# Patient Record
Sex: Female | Born: 1935 | Race: White | Hispanic: No | Smoking: Current some day smoker
Health system: Southern US, Community
[De-identification: ages and names within clinical notes are randomized; demographics above are authoritative.]

## PROBLEM LIST (undated history)

## (undated) DIAGNOSIS — Z9221 Personal history of antineoplastic chemotherapy: Secondary | ICD-10-CM

## (undated) DIAGNOSIS — E079 Disorder of thyroid, unspecified: Secondary | ICD-10-CM

## (undated) DIAGNOSIS — I10 Essential (primary) hypertension: Secondary | ICD-10-CM

## (undated) DIAGNOSIS — C801 Malignant (primary) neoplasm, unspecified: Secondary | ICD-10-CM

## (undated) DIAGNOSIS — Z923 Personal history of irradiation: Secondary | ICD-10-CM

## (undated) DIAGNOSIS — K859 Acute pancreatitis without necrosis or infection, unspecified: Secondary | ICD-10-CM

## (undated) DIAGNOSIS — R0602 Shortness of breath: Secondary | ICD-10-CM

## (undated) HISTORY — DX: Personal history of antineoplastic chemotherapy: Z92.21

## (undated) HISTORY — DX: Personal history of irradiation: Z92.3

## (undated) HISTORY — PX: APPENDECTOMY: SHX54

## (undated) HISTORY — PX: ABDOMINAL HYSTERECTOMY: SHX81

## (undated) HISTORY — PX: CHOLECYSTECTOMY: SHX55

---

## 1997-10-16 ENCOUNTER — Inpatient Hospital Stay (HOSPITAL_COMMUNITY): Admission: EM | Admit: 1997-10-16 | Discharge: 1997-10-17 | Payer: Self-pay | Admitting: Emergency Medicine

## 1998-02-12 ENCOUNTER — Emergency Department (HOSPITAL_COMMUNITY): Admission: EM | Admit: 1998-02-12 | Discharge: 1998-02-12 | Payer: Self-pay

## 1998-02-14 ENCOUNTER — Inpatient Hospital Stay (HOSPITAL_COMMUNITY): Admission: AD | Admit: 1998-02-14 | Discharge: 1998-02-18 | Payer: Self-pay | Admitting: Pulmonary Disease

## 1998-02-14 ENCOUNTER — Encounter: Payer: Self-pay | Admitting: Pulmonary Disease

## 1998-02-15 ENCOUNTER — Encounter: Payer: Self-pay | Admitting: Pulmonary Disease

## 1998-02-17 ENCOUNTER — Encounter (HOSPITAL_BASED_OUTPATIENT_CLINIC_OR_DEPARTMENT_OTHER): Payer: Self-pay | Admitting: General Surgery

## 1998-02-25 ENCOUNTER — Ambulatory Visit (HOSPITAL_COMMUNITY): Admission: RE | Admit: 1998-02-25 | Discharge: 1998-02-25 | Payer: Self-pay | Admitting: General Surgery

## 1998-03-10 ENCOUNTER — Ambulatory Visit (HOSPITAL_COMMUNITY): Admission: RE | Admit: 1998-03-10 | Discharge: 1998-03-10 | Payer: Self-pay | Admitting: General Surgery

## 1998-03-10 ENCOUNTER — Encounter (HOSPITAL_BASED_OUTPATIENT_CLINIC_OR_DEPARTMENT_OTHER): Payer: Self-pay | Admitting: General Surgery

## 1998-03-16 ENCOUNTER — Emergency Department (HOSPITAL_COMMUNITY): Admission: EM | Admit: 1998-03-16 | Discharge: 1998-03-16 | Payer: Self-pay | Admitting: Emergency Medicine

## 1998-09-25 ENCOUNTER — Emergency Department (HOSPITAL_COMMUNITY): Admission: EM | Admit: 1998-09-25 | Discharge: 1998-09-25 | Payer: Self-pay

## 1998-10-03 ENCOUNTER — Encounter: Admission: RE | Admit: 1998-10-03 | Discharge: 1998-11-10 | Payer: Self-pay | Admitting: Pulmonary Disease

## 1998-10-27 ENCOUNTER — Encounter: Payer: Self-pay | Admitting: Emergency Medicine

## 1998-10-27 ENCOUNTER — Emergency Department (HOSPITAL_COMMUNITY): Admission: EM | Admit: 1998-10-27 | Discharge: 1998-10-27 | Payer: Self-pay | Admitting: Emergency Medicine

## 1998-12-23 ENCOUNTER — Inpatient Hospital Stay (HOSPITAL_COMMUNITY): Admission: EM | Admit: 1998-12-23 | Discharge: 1998-12-30 | Payer: Self-pay | Admitting: Emergency Medicine

## 1998-12-23 ENCOUNTER — Encounter: Payer: Self-pay | Admitting: Emergency Medicine

## 1998-12-24 ENCOUNTER — Encounter: Payer: Self-pay | Admitting: Pulmonary Disease

## 1998-12-26 ENCOUNTER — Encounter: Payer: Self-pay | Admitting: Gastroenterology

## 1999-03-11 ENCOUNTER — Emergency Department (HOSPITAL_COMMUNITY): Admission: EM | Admit: 1999-03-11 | Discharge: 1999-03-11 | Payer: Self-pay | Admitting: Emergency Medicine

## 1999-03-11 ENCOUNTER — Encounter: Payer: Self-pay | Admitting: Emergency Medicine

## 1999-05-20 ENCOUNTER — Ambulatory Visit (HOSPITAL_BASED_OUTPATIENT_CLINIC_OR_DEPARTMENT_OTHER): Admission: RE | Admit: 1999-05-20 | Discharge: 1999-05-20 | Payer: Self-pay | Admitting: Orthopedic Surgery

## 2000-10-04 ENCOUNTER — Emergency Department (HOSPITAL_COMMUNITY): Admission: EM | Admit: 2000-10-04 | Discharge: 2000-10-04 | Payer: Self-pay | Admitting: Emergency Medicine

## 2000-10-11 ENCOUNTER — Encounter: Payer: Self-pay | Admitting: Emergency Medicine

## 2000-10-11 ENCOUNTER — Emergency Department (HOSPITAL_COMMUNITY): Admission: EM | Admit: 2000-10-11 | Discharge: 2000-10-11 | Payer: Self-pay | Admitting: *Deleted

## 2001-08-15 ENCOUNTER — Emergency Department (HOSPITAL_COMMUNITY): Admission: EM | Admit: 2001-08-15 | Discharge: 2001-08-15 | Payer: Self-pay | Admitting: Emergency Medicine

## 2001-09-13 ENCOUNTER — Ambulatory Visit (HOSPITAL_BASED_OUTPATIENT_CLINIC_OR_DEPARTMENT_OTHER): Admission: RE | Admit: 2001-09-13 | Discharge: 2001-09-13 | Payer: Self-pay | Admitting: *Deleted

## 2001-09-13 ENCOUNTER — Encounter (INDEPENDENT_AMBULATORY_CARE_PROVIDER_SITE_OTHER): Payer: Self-pay | Admitting: Specialist

## 2001-09-25 ENCOUNTER — Encounter: Admission: RE | Admit: 2001-09-25 | Discharge: 2001-09-25 | Payer: Self-pay | Admitting: *Deleted

## 2001-09-25 ENCOUNTER — Encounter: Payer: Self-pay | Admitting: *Deleted

## 2001-10-16 ENCOUNTER — Encounter: Admission: RE | Admit: 2001-10-16 | Discharge: 2001-10-16 | Payer: Self-pay | Admitting: *Deleted

## 2001-10-16 ENCOUNTER — Encounter: Payer: Self-pay | Admitting: *Deleted

## 2001-12-25 ENCOUNTER — Emergency Department (HOSPITAL_COMMUNITY): Admission: EM | Admit: 2001-12-25 | Discharge: 2001-12-26 | Payer: Self-pay | Admitting: Emergency Medicine

## 2001-12-25 ENCOUNTER — Encounter: Payer: Self-pay | Admitting: Emergency Medicine

## 2001-12-26 ENCOUNTER — Encounter: Payer: Self-pay | Admitting: Emergency Medicine

## 2003-02-21 ENCOUNTER — Emergency Department (HOSPITAL_COMMUNITY): Admission: EM | Admit: 2003-02-21 | Discharge: 2003-02-21 | Payer: Self-pay | Admitting: Emergency Medicine

## 2004-02-24 ENCOUNTER — Emergency Department (HOSPITAL_COMMUNITY): Admission: EM | Admit: 2004-02-24 | Discharge: 2004-02-24 | Payer: Self-pay | Admitting: Emergency Medicine

## 2004-03-09 ENCOUNTER — Ambulatory Visit: Payer: Self-pay | Admitting: Internal Medicine

## 2004-03-30 ENCOUNTER — Ambulatory Visit: Payer: Self-pay | Admitting: Internal Medicine

## 2006-07-09 ENCOUNTER — Emergency Department (HOSPITAL_COMMUNITY): Admission: EM | Admit: 2006-07-09 | Discharge: 2006-07-09 | Payer: Self-pay | Admitting: Emergency Medicine

## 2006-07-22 ENCOUNTER — Encounter: Admission: RE | Admit: 2006-07-22 | Discharge: 2006-07-22 | Payer: Self-pay | Admitting: Family Medicine

## 2006-09-22 ENCOUNTER — Encounter: Admission: RE | Admit: 2006-09-22 | Discharge: 2006-09-22 | Payer: Self-pay | Admitting: Family Medicine

## 2007-01-31 ENCOUNTER — Ambulatory Visit: Payer: Self-pay | Admitting: Pulmonary Disease

## 2007-01-31 DIAGNOSIS — E119 Type 2 diabetes mellitus without complications: Secondary | ICD-10-CM

## 2007-01-31 DIAGNOSIS — R930 Abnormal findings on diagnostic imaging of skull and head, not elsewhere classified: Secondary | ICD-10-CM | POA: Insufficient documentation

## 2007-01-31 DIAGNOSIS — E785 Hyperlipidemia, unspecified: Secondary | ICD-10-CM | POA: Insufficient documentation

## 2007-01-31 DIAGNOSIS — I10 Essential (primary) hypertension: Secondary | ICD-10-CM | POA: Insufficient documentation

## 2007-01-31 LAB — CONVERTED CEMR LAB
BUN: 9 mg/dL (ref 6–23)
Calcium: 9.5 mg/dL (ref 8.4–10.5)
Chloride: 99 meq/L (ref 96–112)
GFR calc Af Amer: 156 mL/min
GFR calc non Af Amer: 129 mL/min

## 2007-02-06 ENCOUNTER — Ambulatory Visit: Payer: Self-pay | Admitting: Cardiology

## 2007-02-15 ENCOUNTER — Encounter: Payer: Self-pay | Admitting: Pulmonary Disease

## 2007-04-12 ENCOUNTER — Encounter (INDEPENDENT_AMBULATORY_CARE_PROVIDER_SITE_OTHER): Payer: Self-pay | Admitting: *Deleted

## 2008-03-30 ENCOUNTER — Emergency Department (HOSPITAL_COMMUNITY): Admission: EM | Admit: 2008-03-30 | Discharge: 2008-03-30 | Payer: Self-pay | Admitting: Emergency Medicine

## 2008-06-10 ENCOUNTER — Ambulatory Visit: Payer: Self-pay | Admitting: Family Medicine

## 2008-06-10 ENCOUNTER — Inpatient Hospital Stay (HOSPITAL_COMMUNITY): Admission: AD | Admit: 2008-06-10 | Discharge: 2008-06-15 | Payer: Self-pay | Admitting: Family Medicine

## 2008-06-10 ENCOUNTER — Encounter: Payer: Self-pay | Admitting: Emergency Medicine

## 2008-06-18 ENCOUNTER — Emergency Department (HOSPITAL_COMMUNITY): Admission: EM | Admit: 2008-06-18 | Discharge: 2008-06-18 | Payer: Self-pay | Admitting: Emergency Medicine

## 2008-06-30 ENCOUNTER — Ambulatory Visit: Payer: Self-pay | Admitting: Family Medicine

## 2008-07-04 ENCOUNTER — Inpatient Hospital Stay (HOSPITAL_COMMUNITY): Admission: EM | Admit: 2008-07-04 | Discharge: 2008-07-06 | Payer: Self-pay | Admitting: Family Medicine

## 2008-11-16 ENCOUNTER — Ambulatory Visit (HOSPITAL_COMMUNITY): Admission: RE | Admit: 2008-11-16 | Discharge: 2008-11-16 | Payer: Self-pay | Admitting: Family Medicine

## 2008-12-10 ENCOUNTER — Emergency Department (HOSPITAL_COMMUNITY): Admission: EM | Admit: 2008-12-10 | Discharge: 2008-12-10 | Payer: Self-pay | Admitting: Emergency Medicine

## 2010-02-16 ENCOUNTER — Emergency Department (HOSPITAL_COMMUNITY)
Admission: EM | Admit: 2010-02-16 | Discharge: 2010-02-16 | Payer: Self-pay | Source: Home / Self Care | Admitting: Emergency Medicine

## 2010-02-23 LAB — CBC
HCT: 43.8 % (ref 36.0–46.0)
Hemoglobin: 15 g/dL (ref 12.0–15.0)
MCH: 29.8 pg (ref 26.0–34.0)
MCHC: 34.2 g/dL (ref 30.0–36.0)
MCV: 86.9 fL (ref 78.0–100.0)
Platelets: 232 10*3/uL (ref 150–400)
RBC: 5.04 MIL/uL (ref 3.87–5.11)
RDW: 14.2 % (ref 11.5–15.5)
WBC: 13.1 10*3/uL — ABNORMAL HIGH (ref 4.0–10.5)

## 2010-02-23 LAB — COMPREHENSIVE METABOLIC PANEL
ALT: 29 U/L (ref 0–35)
AST: 31 U/L (ref 0–37)
Albumin: 3.6 g/dL (ref 3.5–5.2)
Alkaline Phosphatase: 69 U/L (ref 39–117)
BUN: 17 mg/dL (ref 6–23)
CO2: 24 mEq/L (ref 19–32)
Calcium: 8.8 mg/dL (ref 8.4–10.5)
Chloride: 103 mEq/L (ref 96–112)
Creatinine, Ser: 0.69 mg/dL (ref 0.4–1.2)
GFR calc Af Amer: >60 mL/min (ref >60)
GFR calc non Af Amer: >60 mL/min (ref >60)
Glucose, Bld: 265 mg/dL — ABNORMAL HIGH (ref 70–99)
Potassium: 4.2 mEq/L (ref 3.5–5.1)
Sodium: 135 mEq/L (ref 135–145)
Total Bilirubin: 0.6 mg/dL (ref 0.3–1.2)
Total Protein: 6.9 g/dL (ref 6.0–8.3)

## 2010-02-23 LAB — URINALYSIS, ROUTINE W REFLEX MICROSCOPIC
Bilirubin Urine: NEGATIVE
Hgb urine dipstick: NEGATIVE
Ketones, ur: NEGATIVE mg/dL
Nitrite: NEGATIVE
Protein, ur: NEGATIVE mg/dL
Specific Gravity, Urine: 1.012 (ref 1.005–1.030)
Urine Glucose, Fasting: 500 mg/dL — AB
Urobilinogen, UA: 0.2 mg/dL (ref 0.0–1.0)
pH: 5.5 (ref 5.0–8.0)

## 2010-02-23 LAB — DIFFERENTIAL
Basophils Absolute: 0.1 10*3/uL (ref 0.0–0.1)
Basophils Relative: 0 % (ref 0–1)
Eosinophils Absolute: 0 10*3/uL (ref 0.0–0.7)
Eosinophils Relative: 0 % (ref 0–5)
Lymphocytes Relative: 18 % (ref 12–46)
Lymphs Abs: 2.4 10*3/uL (ref 0.7–4.0)
Monocytes Absolute: 0.4 10*3/uL (ref 0.1–1.0)
Monocytes Relative: 3 % (ref 3–12)
Neutro Abs: 10.2 10*3/uL — ABNORMAL HIGH (ref 1.7–7.7)
Neutrophils Relative %: 78 % — ABNORMAL HIGH (ref 43–77)

## 2010-02-23 LAB — LIPASE, BLOOD: Lipase: 22 U/L (ref 11–59)

## 2010-03-01 ENCOUNTER — Inpatient Hospital Stay (HOSPITAL_COMMUNITY)
Admission: EM | Admit: 2010-03-01 | Discharge: 2010-03-08 | Payer: Self-pay | Source: Home / Self Care | Attending: Internal Medicine | Admitting: Internal Medicine

## 2010-03-03 LAB — DIFFERENTIAL
Basophils Absolute: 0 10*3/uL (ref 0.0–0.1)
Eosinophils Absolute: 0 10*3/uL (ref 0.0–0.7)
Lymphocytes Relative: 10 % — ABNORMAL LOW (ref 12–46)
Lymphs Abs: 2.1 10*3/uL (ref 0.7–4.0)
Neutro Abs: 18.3 10*3/uL — ABNORMAL HIGH (ref 1.7–7.7)

## 2010-03-03 LAB — URINALYSIS, ROUTINE W REFLEX MICROSCOPIC
Leukocytes, UA: NEGATIVE
Protein, ur: 30 mg/dL — AB
Urine Glucose, Fasting: >1000 mg/dL — AB
pH: 5.5 (ref 5.0–8.0)

## 2010-03-03 LAB — CBC
MCH: 30.1 pg (ref 26.0–34.0)
MCV: 85.1 fL (ref 78.0–100.0)
Platelets: 263 10*3/uL (ref 150–400)
Platelets: 276 10*3/uL (ref 150–400)
RBC: 5.81 MIL/uL — ABNORMAL HIGH (ref 3.87–5.11)
RDW: 13.7 % (ref 11.5–15.5)
RDW: 14 % (ref 11.5–15.5)
WBC: 17.2 10*3/uL — ABNORMAL HIGH (ref 4.0–10.5)

## 2010-03-03 LAB — LIPID PANEL
Cholesterol: 136 mg/dL (ref 0–200)
HDL: 36 mg/dL — ABNORMAL LOW (ref >39)

## 2010-03-03 LAB — GLUCOSE, CAPILLARY
Glucose-Capillary: 453 mg/dL — ABNORMAL HIGH (ref 70–99)
Glucose-Capillary: 418 mg/dL — ABNORMAL HIGH (ref 70–99)
Glucose-Capillary: 252 mg/dL — ABNORMAL HIGH (ref 70–99)
Glucose-Capillary: 216 mg/dL — ABNORMAL HIGH (ref 70–99)
Glucose-Capillary: 189 mg/dL — ABNORMAL HIGH (ref 70–99)

## 2010-03-03 LAB — LIPASE, BLOOD: Lipase: 635 U/L — ABNORMAL HIGH (ref 11–59)

## 2010-03-03 LAB — URINE MICROSCOPIC-ADD ON

## 2010-03-03 LAB — URINE CULTURE: Culture: NO GROWTH

## 2010-03-03 LAB — COMPREHENSIVE METABOLIC PANEL
Albumin: 3.5 g/dL (ref 3.5–5.2)
BUN: 13 mg/dL (ref 6–23)
Calcium: 9.3 mg/dL (ref 8.4–10.5)
Creatinine, Ser: 0.77 mg/dL (ref 0.4–1.2)
Total Protein: 7.2 g/dL (ref 6.0–8.3)

## 2010-03-03 LAB — HEMOGLOBIN A1C
Hgb A1c MFr Bld: 10.6 % — ABNORMAL HIGH (ref <5.7)
Mean Plasma Glucose: 258 mg/dL — ABNORMAL HIGH (ref <117)

## 2010-03-03 LAB — MRSA PCR SCREENING: MRSA by PCR: NEGATIVE

## 2010-03-03 LAB — BASIC METABOLIC PANEL
Chloride: 105 mEq/L (ref 96–112)
GFR calc Af Amer: >60 mL/min (ref >60)
GFR calc non Af Amer: >60 mL/min (ref >60)
Potassium: 4.5 mEq/L (ref 3.5–5.1)
Sodium: 138 mEq/L (ref 135–145)

## 2010-03-03 LAB — GLUCOSE, RANDOM: Glucose, Bld: 376 mg/dL — ABNORMAL HIGH (ref 70–99)

## 2010-03-04 LAB — COMPREHENSIVE METABOLIC PANEL
Albumin: 2.8 g/dL — ABNORMAL LOW (ref 3.5–5.2)
BUN: 16 mg/dL (ref 6–23)
Calcium: 8.3 mg/dL — ABNORMAL LOW (ref 8.4–10.5)
Chloride: 106 mEq/L (ref 96–112)
Creatinine, Ser: 0.75 mg/dL (ref 0.4–1.2)
GFR calc non Af Amer: >60 mL/min (ref >60)
Total Bilirubin: 1.2 mg/dL (ref 0.3–1.2)

## 2010-03-04 LAB — CBC
HCT: 39.5 % (ref 36.0–46.0)
Hemoglobin: 13.6 g/dL (ref 12.0–15.0)
MCH: 29.4 pg (ref 26.0–34.0)
MCH: 29.4 pg (ref 26.0–34.0)
MCHC: 34.5 g/dL (ref 30.0–36.0)
MCV: 85.3 fL (ref 78.0–100.0)
MCV: 85.3 fL (ref 78.0–100.0)
Platelets: 238 10*3/uL (ref 150–400)
RBC: 5.3 MIL/uL — ABNORMAL HIGH (ref 3.87–5.11)
RBC: 4.63 MIL/uL (ref 3.87–5.11)

## 2010-03-04 LAB — BASIC METABOLIC PANEL
BUN: 14 mg/dL (ref 6–23)
Chloride: 108 mEq/L (ref 96–112)
Glucose, Bld: 117 mg/dL — ABNORMAL HIGH (ref 70–99)
Potassium: 2.8 mEq/L — ABNORMAL LOW (ref 3.5–5.1)

## 2010-03-04 LAB — GLUCOSE, CAPILLARY
Glucose-Capillary: 158 mg/dL — ABNORMAL HIGH (ref 70–99)
Glucose-Capillary: 208 mg/dL — ABNORMAL HIGH (ref 70–99)

## 2010-03-05 LAB — CBC
Hemoglobin: 13 g/dL (ref 12.0–15.0)
MCHC: 35 g/dL (ref 30.0–36.0)
Platelets: 196 10*3/uL (ref 150–400)

## 2010-03-05 LAB — BASIC METABOLIC PANEL
CO2: 22 mEq/L (ref 19–32)
Calcium: 7.6 mg/dL — ABNORMAL LOW (ref 8.4–10.5)
Creatinine, Ser: 0.61 mg/dL (ref 0.4–1.2)
GFR calc Af Amer: >60 mL/min (ref >60)
GFR calc non Af Amer: >60 mL/min (ref >60)
Sodium: 133 mEq/L — ABNORMAL LOW (ref 135–145)

## 2010-03-05 LAB — DIFFERENTIAL
Basophils Absolute: 0 10*3/uL (ref 0.0–0.1)
Basophils Relative: 0 % (ref 0–1)
Eosinophils Absolute: 0 10*3/uL (ref 0.0–0.7)
Monocytes Absolute: 0.9 10*3/uL (ref 0.1–1.0)
Neutro Abs: 14.5 10*3/uL — ABNORMAL HIGH (ref 1.7–7.7)
Neutrophils Relative %: 87 % — ABNORMAL HIGH (ref 43–77)

## 2010-03-05 LAB — GLUCOSE, CAPILLARY
Glucose-Capillary: 159 mg/dL — ABNORMAL HIGH (ref 70–99)
Glucose-Capillary: 159 mg/dL — ABNORMAL HIGH (ref 70–99)
Glucose-Capillary: 218 mg/dL — ABNORMAL HIGH (ref 70–99)
Glucose-Capillary: 230 mg/dL — ABNORMAL HIGH (ref 70–99)

## 2010-03-05 LAB — BRAIN NATRIURETIC PEPTIDE: Pro B Natriuretic peptide (BNP): 357 pg/mL — ABNORMAL HIGH (ref 0.0–100.0)

## 2010-03-06 LAB — GLUCOSE, CAPILLARY
Glucose-Capillary: 229 mg/dL — ABNORMAL HIGH (ref 70–99)
Glucose-Capillary: 229 mg/dL — ABNORMAL HIGH (ref 70–99)
Glucose-Capillary: 226 mg/dL — ABNORMAL HIGH (ref 70–99)

## 2010-03-06 LAB — CBC
HCT: 36.8 % (ref 36.0–46.0)
Hemoglobin: 12.8 g/dL (ref 12.0–15.0)
MCH: 29.2 pg (ref 26.0–34.0)
MCHC: 34.8 g/dL (ref 30.0–36.0)
RBC: 4.39 MIL/uL (ref 3.87–5.11)

## 2010-03-06 LAB — COMPREHENSIVE METABOLIC PANEL
ALT: 19 U/L (ref 0–35)
AST: 19 U/L (ref 0–37)
CO2: 21 mEq/L (ref 19–32)
Calcium: 7.7 mg/dL — ABNORMAL LOW (ref 8.4–10.5)
Chloride: 102 mEq/L (ref 96–112)
Creatinine, Ser: 0.58 mg/dL (ref 0.4–1.2)
GFR calc Af Amer: >60 mL/min (ref >60)
GFR calc non Af Amer: >60 mL/min (ref >60)
Glucose, Bld: 199 mg/dL — ABNORMAL HIGH (ref 70–99)
Total Bilirubin: 1.4 mg/dL — ABNORMAL HIGH (ref 0.3–1.2)

## 2010-03-06 LAB — URINALYSIS, ROUTINE W REFLEX MICROSCOPIC
Bilirubin Urine: NEGATIVE
Hgb urine dipstick: NEGATIVE
Ketones, ur: NEGATIVE mg/dL
Nitrite: NEGATIVE
Specific Gravity, Urine: 1.014 (ref 1.005–1.030)
pH: 6 (ref 5.0–8.0)

## 2010-03-06 LAB — DIFFERENTIAL
Lymphocytes Relative: 9 % — ABNORMAL LOW (ref 12–46)
Lymphs Abs: 1.6 10*3/uL (ref 0.7–4.0)
Monocytes Absolute: 1.1 10*3/uL — ABNORMAL HIGH (ref 0.1–1.0)
Monocytes Relative: 6 % (ref 3–12)
Neutro Abs: 15.3 10*3/uL — ABNORMAL HIGH (ref 1.7–7.7)
Neutrophils Relative %: 85 % — ABNORMAL HIGH (ref 43–77)

## 2010-03-06 LAB — URINE MICROSCOPIC-ADD ON

## 2010-03-07 LAB — DIFFERENTIAL
Basophils Absolute: 0 10*3/uL (ref 0.0–0.1)
Eosinophils Relative: 1 % (ref 0–5)
Lymphocytes Relative: 12 % (ref 12–46)
Monocytes Relative: 8 % (ref 3–12)

## 2010-03-07 LAB — CBC
Hemoglobin: 13.2 g/dL (ref 12.0–15.0)
RBC: 4.44 MIL/uL (ref 3.87–5.11)
WBC: 13.5 10*3/uL — ABNORMAL HIGH (ref 4.0–10.5)

## 2010-03-07 LAB — URINE CULTURE: Special Requests: NEGATIVE

## 2010-03-07 LAB — BASIC METABOLIC PANEL
CO2: 26 mEq/L (ref 19–32)
Calcium: 7.7 mg/dL — ABNORMAL LOW (ref 8.4–10.5)
Chloride: 96 mEq/L (ref 96–112)
GFR calc Af Amer: >60 mL/min (ref >60)
Potassium: 3.3 mEq/L — ABNORMAL LOW (ref 3.5–5.1)
Sodium: 131 mEq/L — ABNORMAL LOW (ref 135–145)

## 2010-03-07 LAB — GLUCOSE, CAPILLARY
Glucose-Capillary: 290 mg/dL — ABNORMAL HIGH (ref 70–99)
Glucose-Capillary: 174 mg/dL — ABNORMAL HIGH (ref 70–99)

## 2010-03-08 LAB — GLUCOSE, CAPILLARY: Glucose-Capillary: 215 mg/dL — ABNORMAL HIGH (ref 70–99)

## 2010-03-08 LAB — BASIC METABOLIC PANEL
BUN: 2 mg/dL — ABNORMAL LOW (ref 6–23)
Calcium: 8.2 mg/dL — ABNORMAL LOW (ref 8.4–10.5)
GFR calc non Af Amer: >60 mL/min (ref >60)
Glucose, Bld: 140 mg/dL — ABNORMAL HIGH (ref 70–99)
Sodium: 134 mEq/L — ABNORMAL LOW (ref 135–145)

## 2010-03-08 LAB — CULTURE, BLOOD (ROUTINE X 2)
Culture  Setup Time: 201201230006
Culture: NO GROWTH

## 2010-03-12 ENCOUNTER — Emergency Department (HOSPITAL_COMMUNITY)
Admission: EM | Admit: 2010-03-12 | Discharge: 2010-03-12 | Disposition: A | Discharge status: Home / Self Care | Payer: Medicare Other | Attending: Emergency Medicine | Admitting: Emergency Medicine

## 2010-03-12 ENCOUNTER — Emergency Department (HOSPITAL_COMMUNITY): Payer: Medicare Other

## 2010-03-12 DIAGNOSIS — I252 Old myocardial infarction: Secondary | ICD-10-CM | POA: Insufficient documentation

## 2010-03-12 DIAGNOSIS — E039 Hypothyroidism, unspecified: Secondary | ICD-10-CM | POA: Insufficient documentation

## 2010-03-12 DIAGNOSIS — F17200 Nicotine dependence, unspecified, uncomplicated: Secondary | ICD-10-CM | POA: Insufficient documentation

## 2010-03-12 DIAGNOSIS — Z8679 Personal history of other diseases of the circulatory system: Secondary | ICD-10-CM | POA: Insufficient documentation

## 2010-03-12 DIAGNOSIS — E119 Type 2 diabetes mellitus without complications: Secondary | ICD-10-CM | POA: Insufficient documentation

## 2010-03-12 DIAGNOSIS — R109 Unspecified abdominal pain: Secondary | ICD-10-CM | POA: Insufficient documentation

## 2010-03-12 DIAGNOSIS — I509 Heart failure, unspecified: Secondary | ICD-10-CM | POA: Insufficient documentation

## 2010-03-12 DIAGNOSIS — I10 Essential (primary) hypertension: Secondary | ICD-10-CM | POA: Insufficient documentation

## 2010-03-12 LAB — CBC
HCT: 38.8 % (ref 36.0–46.0)
Hemoglobin: 13.5 g/dL (ref 12.0–15.0)
MCH: 29.4 pg (ref 26.0–34.0)
MCHC: 34.8 g/dL (ref 30.0–36.0)
MCV: 84.5 fL (ref 78.0–100.0)
Platelets: 371 K/uL (ref 150–400)
RBC: 4.59 MIL/uL (ref 3.87–5.11)
RDW: 14.4 % (ref 11.5–15.5)
WBC: 10.9 K/uL — ABNORMAL HIGH (ref 4.0–10.5)

## 2010-03-12 LAB — COMPREHENSIVE METABOLIC PANEL
BUN: 17 mg/dL (ref 6–23)
Calcium: 7.8 mg/dL — ABNORMAL LOW (ref 8.4–10.5)
Glucose, Bld: 245 mg/dL — ABNORMAL HIGH (ref 70–99)
Total Protein: 5.6 g/dL — ABNORMAL LOW (ref 6.0–8.3)

## 2010-03-12 LAB — COMPREHENSIVE METABOLIC PANEL WITH GFR
ALT: 18 U/L (ref 0–35)
AST: 20 U/L (ref 0–37)
Albumin: 2.3 g/dL — ABNORMAL LOW (ref 3.5–5.2)
Alkaline Phosphatase: 57 U/L (ref 39–117)
CO2: 25 meq/L (ref 19–32)
Chloride: 98 meq/L (ref 96–112)
Creatinine, Ser: 0.93 mg/dL (ref 0.4–1.2)
GFR calc Af Amer: >60 mL/min (ref >60)
GFR calc non Af Amer: 59 mL/min — ABNORMAL LOW (ref >60)
Potassium: 4.6 meq/L (ref 3.5–5.1)
Sodium: 132 meq/L — ABNORMAL LOW (ref 135–145)
Total Bilirubin: 0.9 mg/dL (ref 0.3–1.2)

## 2010-03-12 LAB — DIFFERENTIAL
Basophils Absolute: 0 K/uL (ref 0.0–0.1)
Basophils Relative: 0 % (ref 0–1)
Eosinophils Absolute: 0.1 K/uL (ref 0.0–0.7)
Eosinophils Relative: 1 % (ref 0–5)
Lymphocytes Relative: 14 % (ref 12–46)
Lymphs Abs: 1.5 10*3/uL (ref 0.7–4.0)
Monocytes Absolute: 0.5 10*3/uL (ref 0.1–1.0)
Monocytes Relative: 5 % (ref 3–12)
Neutro Abs: 8.8 K/uL — ABNORMAL HIGH (ref 1.7–7.7)
Neutrophils Relative %: 81 % — ABNORMAL HIGH (ref 43–77)

## 2010-03-12 LAB — LIPASE, BLOOD: Lipase: 40 U/L (ref 11–59)

## 2010-03-22 NOTE — Discharge Summary (Signed)
NAME:  Misty Ball, Misty Ball NO.:  [id number]  MEDICAL RECORD NO.:  [id number]          PATIENT TYPE:  INP  LOCATION:  1344                         FACILITY:  [facility]  PHYSICIAN:  Marcellus Scott, MD     DATE OF BIRTH:  [birth year]  DATE OF ADMISSION:  03/01/2010 DATE OF DISCHARGE:  03/08/2010                              DISCHARGE SUMMARY   PRIMARY CARE PHYSICIAN:  Misty Ball, M.D.  DISCHARGE DIAGNOSES: 1. Acute severe pancreatitis, ? chronic pancreatitis secondary     to pancreatic divisum, improved. 2. Uncontrolled type 2 diabetes mellitus. 3. Hypertension. 4. Hypokalemia, repleted. 5. Presumed aspiration pneumonia, resolved. 6. Mild hyponatremia. 7. Possible mild pulmonary edema/acute diastolic congestive heart     failure secondary to fluid resuscitation,resolved. For outpatient     echocardiogram. 8. Hypothyroidism. 9. History of myocardial infarction in the past. 10.History of transient ischemic attack and cerebrovascular accident. 11.History of cholecystectomy.  DISCHARGE MEDICATIONS: 1. Nicotine 21 mg per 24-hour patch transdermally daily. 2. MiraLax 17 grams in 8 ounces of liquid p.o. daily. 3. Senna 2 tablets p.o. daily.  Hold if having diarrhea. 4. Creon 12,000 units, 1 capsule p.o. daily. 5. Glipizide 10 mg p.o. daily. 6. Levothyroxine 100 mcg p.o. daily. 7. Lisinopril 30 mg p.o. daily. 8. Doxepin 25 mg p.o. at bedtime. 9. Enteric-coated aspirin 81 mg p.o. daily.  DISCONTINUED MEDICATIONS: 1. Simvastatin. 2. Kombiglyze.  IMAGING STUDIES: 1. Chest x-ray, January 28.  Impression:  Improved aeration with some     decrease in volume, left pleural effusions. 2. Chest x-ray, January 27.  Impression:  Progression of bibasilar     pneumonia or edema. 3. Chest x-ray, January 25.  Impression:  Increasing bilateral lower     lobe opacities, right greater than left. 4. Chest x-ray, January 23.  Impression:  Moderate bibasilar  atelectasis/infiltrate. 5. CT of the abdomen and pelvis with contrast, January 22.     Impression:  Acute pancreatitis.  Swelling of the pancreas and     extensive retroperitoneal edema.  No frank pseudocyst at this time.     Small amount of free intraperitoneal fluid.  No specific etiology     of pancreatitis identified. 6. Chest x-ray on admission.  Cardiomegaly and bronchitic change. 7. Abdominal x-ray on admission, nonobstructive bowel gas pattern.  PERTINENT LABS:  Blood cultures x2 are no growth, final report.  Basic metabolic panel today with sodium 134, glucose 140, BUN 2, creatinine 0.66.  Urine culture from January 27 negative.  CBC:  Hemoglobin 13.2, hematocrit 37, white blood cell 13.5, platelets 205.  BNP on January 26 was 357.  Lipase on January 25 had normalized to 25.  Urine culture from January 22 was negative.  TSH of 0.921, hemoglobin A1c 10.6.  Lipid panel only remarkable for HDL of 36.  Admitting lipase was 1901 with LFTs being essentially normal, except AST of 40.  CONSULTATIONS:  None.  DIET:  Diabetic and low-fat diet. ACTIVITIES:  Increase activity gradually and with assist.  The patient will have Home Health services.  Complaints today:  None.  The patient is tolerating solid food without any abdominal pain or  nausea or vomiting.  She denies chest pain, difficulty breathing or cough.  She has had 4 BMs.  PHYSICAL EXAMINATION:  GENERAL:  The patient is sitting on a recliner in no obvious distress. VITAL SIGNS:  Temperature 98.7 degrees Fahrenheit, pulse 84 per minute, respirations 16 per minute, blood pressure 150/77 mmHg and saturating at 94% on room air.  Her CBGs range between 152-290 mg/dL. RESPIRATORY SYSTEM:  Occasional basal crackles, otherwise clear. CARDIOVASCULAR SYSTEM:  First and second heart sounds heard and regular. No JVD. ABDOMEN:  Soft and bowel sounds present. CENTRAL NERVOUS SYSTEM:  The patient is awake, alert, oriented x3.  No focal  neurological deficits.  HOSPITAL COURSE:  [name] is a 77 year old female patient with a history of chronic pancreatitis, pancreatic divisum, on pancreatic enzyme supplements, hypertension, type 2 diabetes mellitus, prior cholecystectomy, who presented with worsening abdominal pain, nausea and vomiting.  She was seen by her primary care physician, who sent her to the emergency room on January 9, at which time all her lab workup was normal.  However, her symptoms persisted, and she returned to the emergency department and she had an markedly elevated lipase of 1901 with CT evidence of acute pancreatitis without any complicating features and, thereby, was admitted for further management.  1. Acute on chronic pancreatitis.  Question secondary to pancreatic     divisum.  The patient was admitted to the step-down unit.  She was     made n.p.o., hydrated with IV fluids and provided pain medications.     With these measures, she made slow and gradual recovery.  She was     transferred out of the step-down unit.  Her diet was slowly     advanced and now she is tolerating regular consistency food.     Consider further evaluation of the etiology of this as an     outpatient.  Some of her medications which may cause pancreatitis     are currently on hold and may consider resuming them as an     outpatient, including her Kombiglyze and simvastatin.  May consider     an outpatient GI consultation as well. 2. Uncontrolled type 2 diabetes mellitus.  The patient in the hospital     was on Lantus and sliding scale but will be switched back to her     glipizide.  She will need tighter glycemic control as an     outpatient.  She has poor outpatient control as evidenced by an     elevated A1c.  May consider adding low-dose Lantus as well.     Unclear if the patient has been compliant with her diet and     medications. 3. Uncontrolled hypertension.  The patient was on low-dose beta     blockers and  lisinopril.  Beta blockers will be discontinued and     she will go home on her usual dose of lisinopril.  Her blood     pressure control has been reasonable. 4. Tobacco abuse.  Cessation counseling done and the patient will be     on a nicotine patch. 5. Hypothyroidism.  The patient is clinically and biochemically     euthyroid. 6. Presumed aspiration pneumonia as evidenced by chest x-ray.  The     patient was started on Zosyn and will complete a total of 7 days of     antibiotics.  Today, she has no chest symptoms and her chest x-ray     is improved.  Recommend  repeating chest x-ray in a couple of weeks     to ensure resolution. 7. Possible mild pulmonary edema or acute diastolic congestive heart     failure secondary to volume resuscitation.  This improved with IV     Lasix.  Recommend performing a 2-D echocardiogram as an outpatient.  The patient was seen by Physical Therapy and Occupational Therapy, who recommend Home Health needs and those have been arranged.  DISPOSITION:  The patient will be discharged home in stable condition.  FOLLOW-UP RECOMMENDATIONS.:  With Dr. Elvina Ball on February 2, at 8:45 a.m.  Time taken in coordinating this discharge was 45 minutes.     Marcellus Scott, MD     AH/MEDQ  D:  03/08/2010  T:  03/08/2010  Job:  [id number]  cc:   Misty Ball, M.D. Fax: [phone]  Electronically Signed by Marcellus Scott MD on 03/22/2010 05:17:28 PM

## 2010-03-24 NOTE — H&P (Signed)
NAME:  Misty Ball, Misty Ball NO.:  [id number]  MEDICAL RECORD NO.:  [id number]          PATIENT TYPE:  INP  LOCATION:  0102                         FACILITY:  [facility]  PHYSICIAN:  Erick Blinks, MD     DATE OF BIRTH:  [birth year]  DATE OF ADMISSION:  03/01/2010 DATE OF DISCHARGE:                             HISTORY & PHYSICAL   PRIMARY CARE PHYSICIAN:  Dr.  [name], M.D. from [facility]  CHIEF COMPLAINT:  Abdominal pain.  HISTORY OF PRESENT ILLNESS:  This is a 77 year old female with a history of chronic pancreatitis, pancreatic divisum, hypertension, diabetes, who presents to the ER today with worsening abdominal pain, nausea and vomiting.  The patient reports that the onset of her symptoms was approximately Tuesday of last week.  She was noting increasing abdominal pain as well as vomiting with any oral intake.  She had seen her primary care physician who referred her to the ER. She did have some lab work done here in the emergency room on January 9, all of which were normal. The patient reports that her symptoms continued to get worse which prompted her ER arrival today.  She said she was unable to tolerate anything by mouth, has not eaten anything in a few days and feels very weak.  She reports having vomiting of clear colored material.  She denies any diarrhea.  Her abdominal pain is diffuse, currently an 8 to 9 out of 10.  She does not report having any fever.  She does not have any dysuria.  Denies any shortness of breath or cough or any chest pain.  In the ER, she was noted to have an elevated lipase at 1901, and she also had CT evidence of acute pancreatitis.  The patient will be admitted for treatment of pancreatitis.  PAST MEDICAL HISTORY: 1. Hypertension. 2. Type 2 diabetes. 3. Hypothyroidism. 4. History of MI in the past. 5. History of TIA/CVA. 6. History of cholecystectomy. 7. Chronic pancreatitis with pancreatic divisum.  ALLERGIES:   SULFA which causes sickness.  MEDICATIONS: 1. Saxagliptin/metformin 2.5/100 mg one tablet daily. 2. Creon 12,000 units by mouth one capsule daily. 3. Doxepin 25 mg one capsule daily at bedtime. 4. Glipizide 10 mg one tablet daily. 5. Levothyroxine 100 mcg once daily. 6. Lisinopril 30 mg one tablet daily. 7. Simvastatin 10 mg one tablet daily at bedtime.  SOCIAL HISTORY:  The patient smokes one and a half packets of cigarettes daily.  She denies any alcohol use.  She lives at home with her son. She ambulates occasionally with the assistance of a cane but is otherwise independent.  FAMILY HISTORY:  Significant for a mother with an MI at age 24 and skin cancer.  She has children with diabetes and celiac disease.  REVIEW OF SYSTEMS:  All systems have been reviewed, and pertinent positives are stated in the history of present illness.  PHYSICAL EXAMINATION:  VITAL SIGNS:  Temperature of 97.3, respiratory rate of 21, heart rate of 97, blood pressure of 172/89, oxygen saturations of 92%. HEENT:  The patient is somewhat somnolent.  She has recently received pain medicine and does  not appear to be in any acute distress.  HEENT is normocephalic, atraumatic.  Pupils are equal, round and reactive to light. NECK:  Supple. CHEST:  Clear to auscultation bilaterally. CARDIAC:  Exam shows S1 and S2, slightly tachycardic. ABDOMEN:  Soft.  Diffusely tender on palpation.  No rigidity or guarding.  Bowel sounds are active. EXTREMITIES:  Show no cyanosis, clubbing or edema. NEUROLOGIC:  Neurologically, the patient is moving all four extremities spontaneously.  Strength appears to be equal bilaterally.  Cranial nerves II through XII are grossly intact.  PERTINENT LABORATORY AND X-RAY DATA:  Sodium 137, potassium 4.2, chloride 100, bicarbonate 24, glucose 338, BUN 13, creatinine 0.77, bilirubin 0.6, alkaline phosphatase 75, AST 40, ALT 35, albumin 3.5, calcium 9.3, lipase 1901.  CBC shows a WBC of  21.3, hemoglobin 17.2, platelet count of 263.  Urinalysis is negative for any signs of infection.  CT of the abdomen and pelvis shows acute pancreatitis, swelling of the pancreas and extensive retroperitoneal edema.  No frank pseudocyst at this time.  Small amount of free intraperitoneal fluid. No specific etiology of pancreatitis identified.  ASSESSMENT AND PLAN: 1. Acute pancreatitis.  The patient will be started on clear liquids     as tolerated.  Otherwise, she will be made nothing by mouth.  She     will be given intravenous fluids as well as pain control.  The     patient does not have any CT evidence of necrosis at this time.     She is afebrile.  Therefore, we will hold off on antibiotics. 2. Leukocytosis.  The patient has a significant leukocytosis, although     again she is afebrile and does not have any clear focus of     infection.  If she does start having fevers, then antibiotics could     be started at that time, but this is likely secondary to stress.     We will give her intravenous fluids, monitor her progression, and     repeat a  CBC in the morning. 3. Type 2 diabetes.  The patient's oral hypoglycemics will be held at     this time.  She will be placed on sliding scale insulin, and basal     insulin can be added accordingly.  We will also check an A1c. 4. Hypothyroidism.  Will check a TSH. 5. Hypertension.  We will continue her lisinopril for now, and adjust     her medications accordingly.  She likely has     worsening hypertension at this time secondary to her pain. 6. Code status.  The patient confirms that she is a DNR.  Further     orders will be per the clinical course.  TIME SPENT ON THIS ADMISSION:  50 minutes.     Erick Blinks, MD     JM/MEDQ  D:  03/01/2010  T:  03/01/2010  Job:  [id number]  cc:   [name], M.D. Fax: [phone]  Electronically Signed by Erick Blinks  on 03/24/2010 10:54:07 PM

## 2010-05-19 LAB — CBC
HCT: 35.6 % — ABNORMAL LOW (ref 36.0–46.0)
HCT: 35.8 % — ABNORMAL LOW (ref 36.0–46.0)
HCT: 35.8 % — ABNORMAL LOW (ref 36.0–46.0)
HCT: 35.8 % — ABNORMAL LOW (ref 36.0–46.0)
HCT: 33.8 % — ABNORMAL LOW (ref 36.0–46.0)
HCT: 42.6 % (ref 36.0–46.0)
Hemoglobin: 11.6 g/dL — ABNORMAL LOW (ref 12.0–15.0)
Hemoglobin: 12.1 g/dL (ref 12.0–15.0)
Hemoglobin: 12.3 g/dL (ref 12.0–15.0)
Hemoglobin: 11.8 g/dL — ABNORMAL LOW (ref 12.0–15.0)
MCHC: 32.7 g/dL (ref 30.0–36.0)
MCHC: 32.9 g/dL (ref 30.0–36.0)
MCHC: 33.6 g/dL (ref 30.0–36.0)
MCHC: 34.1 g/dL (ref 30.0–36.0)
MCV: 85.1 fL (ref 78.0–100.0)
MCV: 85.3 fL (ref 78.0–100.0)
MCV: 85.7 fL (ref 78.0–100.0)
MCV: 86.6 fL (ref 78.0–100.0)
Platelets: 332 10*3/uL (ref 150–400)
Platelets: 306 10*3/uL (ref 150–400)
Platelets: 338 10*3/uL (ref 150–400)
Platelets: 342 10*3/uL (ref 150–400)
Platelets: 404 10*3/uL — ABNORMAL HIGH (ref 150–400)
Platelets: 339 10*3/uL (ref 150–400)
Platelets: 422 10*3/uL — ABNORMAL HIGH (ref 150–400)
RBC: 4.16 MIL/uL (ref 3.87–5.11)
RBC: 4.21 MIL/uL (ref 3.87–5.11)
RBC: 4.35 MIL/uL (ref 3.87–5.11)
RBC: 4.12 MIL/uL (ref 3.87–5.11)
RBC: 4.19 MIL/uL (ref 3.87–5.11)
RBC: 4.48 MIL/uL (ref 3.87–5.11)
RBC: 4.92 MIL/uL (ref 3.87–5.11)
RDW: 16 % — ABNORMAL HIGH (ref 11.5–15.5)
RDW: 16.6 % — ABNORMAL HIGH (ref 11.5–15.5)
WBC: 5.5 10*3/uL (ref 4.0–10.5)
WBC: 14.3 10*3/uL — ABNORMAL HIGH (ref 4.0–10.5)
WBC: 15.8 10*3/uL — ABNORMAL HIGH (ref 4.0–10.5)
WBC: 16 10*3/uL — ABNORMAL HIGH (ref 4.0–10.5)
WBC: 14 10*3/uL — ABNORMAL HIGH (ref 4.0–10.5)

## 2010-05-19 LAB — GLUCOSE, CAPILLARY
Glucose-Capillary: 103 mg/dL — ABNORMAL HIGH (ref 70–99)
Glucose-Capillary: 104 mg/dL — ABNORMAL HIGH (ref 70–99)
Glucose-Capillary: 108 mg/dL — ABNORMAL HIGH (ref 70–99)
Glucose-Capillary: 116 mg/dL — ABNORMAL HIGH (ref 70–99)
Glucose-Capillary: 140 mg/dL — ABNORMAL HIGH (ref 70–99)
Glucose-Capillary: 140 mg/dL — ABNORMAL HIGH (ref 70–99)
Glucose-Capillary: 145 mg/dL — ABNORMAL HIGH (ref 70–99)
Glucose-Capillary: 156 mg/dL — ABNORMAL HIGH (ref 70–99)
Glucose-Capillary: 178 mg/dL — ABNORMAL HIGH (ref 70–99)
Glucose-Capillary: 189 mg/dL — ABNORMAL HIGH (ref 70–99)
Glucose-Capillary: 210 mg/dL — ABNORMAL HIGH (ref 70–99)
Glucose-Capillary: 260 mg/dL — ABNORMAL HIGH (ref 70–99)
Glucose-Capillary: 274 mg/dL — ABNORMAL HIGH (ref 70–99)
Glucose-Capillary: 91 mg/dL (ref 70–99)
Glucose-Capillary: 99 mg/dL (ref 70–99)
Glucose-Capillary: 106 mg/dL — ABNORMAL HIGH (ref 70–99)
Glucose-Capillary: 111 mg/dL — ABNORMAL HIGH (ref 70–99)
Glucose-Capillary: 130 mg/dL — ABNORMAL HIGH (ref 70–99)
Glucose-Capillary: 133 mg/dL — ABNORMAL HIGH (ref 70–99)
Glucose-Capillary: 135 mg/dL — ABNORMAL HIGH (ref 70–99)
Glucose-Capillary: 137 mg/dL — ABNORMAL HIGH (ref 70–99)
Glucose-Capillary: 152 mg/dL — ABNORMAL HIGH (ref 70–99)
Glucose-Capillary: 170 mg/dL — ABNORMAL HIGH (ref 70–99)
Glucose-Capillary: 171 mg/dL — ABNORMAL HIGH (ref 70–99)
Glucose-Capillary: 172 mg/dL — ABNORMAL HIGH (ref 70–99)
Glucose-Capillary: 222 mg/dL — ABNORMAL HIGH (ref 70–99)
Glucose-Capillary: 248 mg/dL — ABNORMAL HIGH (ref 70–99)

## 2010-05-19 LAB — DIFFERENTIAL
Basophils Absolute: 0 10*3/uL (ref 0.0–0.1)
Basophils Absolute: 0 10*3/uL (ref 0.0–0.1)
Basophils Relative: 1 % (ref 0–1)
Basophils Relative: 0 % (ref 0–1)
Basophils Relative: 1 % (ref 0–1)
Eosinophils Absolute: 0.1 10*3/uL (ref 0.0–0.7)
Eosinophils Relative: 0 % (ref 0–5)
Lymphocytes Relative: 29 % (ref 12–46)
Monocytes Absolute: 0.2 10*3/uL (ref 0.1–1.0)
Monocytes Absolute: 0.4 10*3/uL (ref 0.1–1.0)
Monocytes Relative: 7 % (ref 3–12)
Neutro Abs: 2.8 10*3/uL (ref 1.7–7.7)
Neutro Abs: 3.7 10*3/uL (ref 1.7–7.7)
Neutrophils Relative %: 49 % (ref 43–77)

## 2010-05-19 LAB — BASIC METABOLIC PANEL
BUN: 7 mg/dL (ref 6–23)
BUN: 8 mg/dL (ref 6–23)
BUN: 8 mg/dL (ref 6–23)
CO2: 24 mEq/L (ref 19–32)
CO2: 27 mEq/L (ref 19–32)
CO2: 27 mEq/L (ref 19–32)
CO2: 29 mEq/L (ref 19–32)
CO2: 25 mEq/L (ref 19–32)
CO2: 27 mEq/L (ref 19–32)
Calcium: 8.6 mg/dL (ref 8.4–10.5)
Calcium: 7.3 mg/dL — ABNORMAL LOW (ref 8.4–10.5)
Calcium: 7.6 mg/dL — ABNORMAL LOW (ref 8.4–10.5)
Calcium: 7.8 mg/dL — ABNORMAL LOW (ref 8.4–10.5)
Chloride: 100 mEq/L (ref 96–112)
Chloride: 104 mEq/L (ref 96–112)
Chloride: 107 mEq/L (ref 96–112)
Creatinine, Ser: 0.51 mg/dL (ref 0.4–1.2)
Creatinine, Ser: 0.52 mg/dL (ref 0.4–1.2)
GFR calc Af Amer: >60 mL/min (ref >60)
GFR calc Af Amer: >60 mL/min (ref >60)
GFR calc Af Amer: >60 mL/min (ref >60)
GFR calc Af Amer: >60 mL/min (ref >60)
GFR calc Af Amer: >60 mL/min (ref >60)
GFR calc Af Amer: >60 mL/min (ref >60)
GFR calc non Af Amer: >60 mL/min (ref >60)
GFR calc non Af Amer: >60 mL/min (ref >60)
GFR calc non Af Amer: >60 mL/min (ref >60)
Glucose, Bld: 123 mg/dL — ABNORMAL HIGH (ref 70–99)
Glucose, Bld: 109 mg/dL — ABNORMAL HIGH (ref 70–99)
Glucose, Bld: 132 mg/dL — ABNORMAL HIGH (ref 70–99)
Potassium: 2.8 mEq/L — ABNORMAL LOW (ref 3.5–5.1)
Potassium: 3.5 mEq/L (ref 3.5–5.1)
Potassium: 2.8 mEq/L — ABNORMAL LOW (ref 3.5–5.1)
Potassium: 2.9 mEq/L — ABNORMAL LOW (ref 3.5–5.1)
Potassium: 3.3 mEq/L — ABNORMAL LOW (ref 3.5–5.1)
Sodium: 138 mEq/L (ref 135–145)
Sodium: 138 mEq/L (ref 135–145)
Sodium: 140 mEq/L (ref 135–145)
Sodium: 130 mEq/L — ABNORMAL LOW (ref 135–145)
Sodium: 135 mEq/L (ref 135–145)

## 2010-05-19 LAB — LACTATE DEHYDROGENASE: LDH: 171 U/L (ref 94–250)

## 2010-05-19 LAB — COMPREHENSIVE METABOLIC PANEL
ALT: 12 U/L (ref 0–35)
ALT: 9 U/L (ref 0–35)
AST: 16 U/L (ref 0–37)
AST: 22 U/L (ref 0–37)
AST: 22 U/L (ref 0–37)
Albumin: 2.6 g/dL — ABNORMAL LOW (ref 3.5–5.2)
Albumin: 3 g/dL — ABNORMAL LOW (ref 3.5–5.2)
Albumin: 2.3 g/dL — ABNORMAL LOW (ref 3.5–5.2)
Albumin: 3.4 g/dL — ABNORMAL LOW (ref 3.5–5.2)
Alkaline Phosphatase: 43 U/L (ref 39–117)
Alkaline Phosphatase: 45 U/L (ref 39–117)
Alkaline Phosphatase: 54 U/L (ref 39–117)
Alkaline Phosphatase: 50 U/L (ref 39–117)
Alkaline Phosphatase: 58 U/L (ref 39–117)
BUN: 10 mg/dL (ref 6–23)
BUN: 6 mg/dL (ref 6–23)
BUN: 9 mg/dL (ref 6–23)
BUN: 11 mg/dL (ref 6–23)
BUN: 13 mg/dL (ref 6–23)
CO2: 26 mEq/L (ref 19–32)
CO2: 28 mEq/L (ref 19–32)
CO2: 24 mEq/L (ref 19–32)
CO2: 23 mEq/L (ref 19–32)
Chloride: 106 mEq/L (ref 96–112)
Chloride: 101 mEq/L (ref 96–112)
Chloride: 104 mEq/L (ref 96–112)
Chloride: 100 mEq/L (ref 96–112)
Creatinine, Ser: 0.56 mg/dL (ref 0.4–1.2)
Creatinine, Ser: 0.61 mg/dL (ref 0.4–1.2)
GFR calc Af Amer: >60 mL/min (ref >60)
GFR calc Af Amer: >60 mL/min (ref >60)
GFR calc Af Amer: >60 mL/min (ref >60)
GFR calc non Af Amer: >60 mL/min (ref >60)
GFR calc non Af Amer: >60 mL/min (ref >60)
GFR calc non Af Amer: >60 mL/min (ref >60)
Glucose, Bld: 101 mg/dL — ABNORMAL HIGH (ref 70–99)
Glucose, Bld: 115 mg/dL — ABNORMAL HIGH (ref 70–99)
Glucose, Bld: 102 mg/dL — ABNORMAL HIGH (ref 70–99)
Potassium: 3.4 mEq/L — ABNORMAL LOW (ref 3.5–5.1)
Potassium: 3.9 mEq/L (ref 3.5–5.1)
Potassium: 3.9 mEq/L (ref 3.5–5.1)
Potassium: 3.4 mEq/L — ABNORMAL LOW (ref 3.5–5.1)
Potassium: 4.2 mEq/L (ref 3.5–5.1)
Sodium: 137 mEq/L (ref 135–145)
Sodium: 137 mEq/L (ref 135–145)
Total Bilirubin: 0.5 mg/dL (ref 0.3–1.2)
Total Bilirubin: 0.4 mg/dL (ref 0.3–1.2)
Total Bilirubin: 0.8 mg/dL (ref 0.3–1.2)
Total Bilirubin: 1 mg/dL (ref 0.3–1.2)
Total Protein: 5.1 g/dL — ABNORMAL LOW (ref 6.0–8.3)

## 2010-05-19 LAB — LIPASE, BLOOD
Lipase: 20 U/L (ref 11–59)
Lipase: 30 U/L (ref 11–59)
Lipase: 1930 U/L — ABNORMAL HIGH (ref 11–59)

## 2010-05-19 LAB — URINALYSIS, ROUTINE W REFLEX MICROSCOPIC
Bilirubin Urine: NEGATIVE
Ketones, ur: 40 mg/dL — AB
Nitrite: NEGATIVE
Nitrite: NEGATIVE
Protein, ur: 100 mg/dL — AB
Specific Gravity, Urine: 1.02 (ref 1.005–1.030)
Urobilinogen, UA: 1 mg/dL (ref 0.0–1.0)
pH: 6.5 (ref 5.0–8.0)

## 2010-05-19 LAB — IGG 1, 2, 3, AND 4
IgG Subclass 2: 219 mg/dL — ABNORMAL LOW (ref 242–700)
IgG Subclass 3: 154 mg/dL (ref 22–176)
IgG Subclass 4: 29 mg/dL (ref 4–86)
IgG Total IGGSUB: 798 mg/dL (ref 694–1618)

## 2010-05-19 LAB — LIPID PANEL
Cholesterol: 100 mg/dL (ref 0–200)
HDL: 31 mg/dL — ABNORMAL LOW (ref >39)
Total CHOL/HDL Ratio: 3.2 RATIO

## 2010-05-19 LAB — URINE MICROSCOPIC-ADD ON

## 2010-05-19 LAB — CLOSTRIDIUM DIFFICILE EIA: C difficile Toxins A+B, EIA: NEGATIVE

## 2010-05-19 LAB — URINE CULTURE
Colony Count: NO GROWTH
Culture: NO GROWTH

## 2010-05-19 LAB — MAGNESIUM: Magnesium: 1.9 mg/dL (ref 1.5–2.5)

## 2010-05-19 LAB — AMYLASE: Amylase: 44 U/L (ref 27–131)

## 2010-05-26 LAB — DIFFERENTIAL
Lymphs Abs: 1.9 10*3/uL (ref 0.7–4.0)
Monocytes Relative: 6 % (ref 3–12)
Neutro Abs: 3.8 10*3/uL (ref 1.7–7.7)
Neutrophils Relative %: 61 % (ref 43–77)

## 2010-05-26 LAB — POCT I-STAT, CHEM 8
BUN: 12 mg/dL (ref 6–23)
Creatinine, Ser: 0.8 mg/dL (ref 0.4–1.2)
Hemoglobin: 11.9 g/dL — ABNORMAL LOW (ref 12.0–15.0)
Potassium: 4.1 mEq/L (ref 3.5–5.1)
Sodium: 137 mEq/L (ref 135–145)

## 2010-05-26 LAB — CBC
RBC: 3.81 MIL/uL — ABNORMAL LOW (ref 3.87–5.11)
WBC: 6.2 10*3/uL (ref 4.0–10.5)

## 2010-05-26 LAB — POCT CARDIAC MARKERS
CKMB, poc: <1 ng/mL — ABNORMAL LOW (ref 1.0–8.0)
Myoglobin, poc: 52.5 ng/mL (ref 12–200)
Myoglobin, poc: 57.6 ng/mL (ref 12–200)

## 2010-06-18 ENCOUNTER — Emergency Department (HOSPITAL_COMMUNITY): Payer: Medicare Other

## 2010-06-18 ENCOUNTER — Emergency Department (HOSPITAL_COMMUNITY)
Admission: EM | Admit: 2010-06-18 | Discharge: 2010-06-18 | Disposition: A | Discharge status: Home / Self Care | Payer: Medicare Other | Attending: Emergency Medicine | Admitting: Emergency Medicine

## 2010-06-18 DIAGNOSIS — E119 Type 2 diabetes mellitus without complications: Secondary | ICD-10-CM | POA: Insufficient documentation

## 2010-06-18 DIAGNOSIS — Z8673 Personal history of transient ischemic attack (TIA), and cerebral infarction without residual deficits: Secondary | ICD-10-CM | POA: Insufficient documentation

## 2010-06-18 DIAGNOSIS — R059 Cough, unspecified: Secondary | ICD-10-CM | POA: Insufficient documentation

## 2010-06-18 DIAGNOSIS — R05 Cough: Secondary | ICD-10-CM | POA: Insufficient documentation

## 2010-06-18 DIAGNOSIS — I509 Heart failure, unspecified: Secondary | ICD-10-CM | POA: Insufficient documentation

## 2010-06-18 DIAGNOSIS — R093 Abnormal sputum: Secondary | ICD-10-CM | POA: Insufficient documentation

## 2010-06-18 DIAGNOSIS — I252 Old myocardial infarction: Secondary | ICD-10-CM | POA: Insufficient documentation

## 2010-06-18 DIAGNOSIS — Z79899 Other long term (current) drug therapy: Secondary | ICD-10-CM | POA: Insufficient documentation

## 2010-06-18 DIAGNOSIS — R0602 Shortness of breath: Secondary | ICD-10-CM | POA: Insufficient documentation

## 2010-06-18 DIAGNOSIS — N390 Urinary tract infection, site not specified: Secondary | ICD-10-CM | POA: Insufficient documentation

## 2010-06-18 DIAGNOSIS — I10 Essential (primary) hypertension: Secondary | ICD-10-CM | POA: Insufficient documentation

## 2010-06-18 DIAGNOSIS — R61 Generalized hyperhidrosis: Secondary | ICD-10-CM | POA: Insufficient documentation

## 2010-06-18 LAB — CBC
Hemoglobin: 13.2 g/dL (ref 12.0–15.0)
MCH: 27.7 pg (ref 26.0–34.0)
Platelets: 539 10*3/uL — ABNORMAL HIGH (ref 150–400)
RBC: 4.77 MIL/uL (ref 3.87–5.11)
WBC: 12.3 10*3/uL — ABNORMAL HIGH (ref 4.0–10.5)

## 2010-06-18 LAB — DIFFERENTIAL
Basophils Absolute: 0.1 10*3/uL (ref 0.0–0.1)
Basophils Relative: 1 % (ref 0–1)
Monocytes Relative: 5 % (ref 3–12)
Neutro Abs: 8.6 10*3/uL — ABNORMAL HIGH (ref 1.7–7.7)
Neutrophils Relative %: 70 % (ref 43–77)

## 2010-06-18 LAB — POCT CARDIAC MARKERS
Myoglobin, poc: 110 ng/mL (ref 12–200)
Troponin i, poc: <0.05 ng/mL (ref 0.00–0.09)

## 2010-06-18 LAB — URINALYSIS, ROUTINE W REFLEX MICROSCOPIC
Glucose, UA: NEGATIVE mg/dL
Specific Gravity, Urine: 1.013 (ref 1.005–1.030)
pH: 5.5 (ref 5.0–8.0)

## 2010-06-18 LAB — BASIC METABOLIC PANEL
CO2: 28 mEq/L (ref 19–32)
Chloride: 94 mEq/L — ABNORMAL LOW (ref 96–112)
GFR calc Af Amer: >60 mL/min (ref >60)
Potassium: 4.3 mEq/L (ref 3.5–5.1)
Sodium: 131 mEq/L — ABNORMAL LOW (ref 135–145)

## 2010-06-18 LAB — URINE MICROSCOPIC-ADD ON

## 2010-06-19 ENCOUNTER — Emergency Department (HOSPITAL_COMMUNITY)
Admission: EM | Admit: 2010-06-19 | Discharge: 2010-06-19 | Disposition: A | Discharge status: Home / Self Care | Payer: Medicare Other | Attending: Emergency Medicine | Admitting: Emergency Medicine

## 2010-06-19 DIAGNOSIS — E039 Hypothyroidism, unspecified: Secondary | ICD-10-CM | POA: Insufficient documentation

## 2010-06-19 DIAGNOSIS — Z8673 Personal history of transient ischemic attack (TIA), and cerebral infarction without residual deficits: Secondary | ICD-10-CM | POA: Insufficient documentation

## 2010-06-19 DIAGNOSIS — I252 Old myocardial infarction: Secondary | ICD-10-CM | POA: Insufficient documentation

## 2010-06-19 DIAGNOSIS — IMO0002 Reserved for concepts with insufficient information to code with codable children: Secondary | ICD-10-CM | POA: Insufficient documentation

## 2010-06-19 DIAGNOSIS — R05 Cough: Secondary | ICD-10-CM | POA: Insufficient documentation

## 2010-06-19 DIAGNOSIS — I509 Heart failure, unspecified: Secondary | ICD-10-CM | POA: Insufficient documentation

## 2010-06-19 DIAGNOSIS — E119 Type 2 diabetes mellitus without complications: Secondary | ICD-10-CM | POA: Insufficient documentation

## 2010-06-19 DIAGNOSIS — F411 Generalized anxiety disorder: Secondary | ICD-10-CM | POA: Insufficient documentation

## 2010-06-19 DIAGNOSIS — R079 Chest pain, unspecified: Secondary | ICD-10-CM | POA: Insufficient documentation

## 2010-06-19 DIAGNOSIS — I10 Essential (primary) hypertension: Secondary | ICD-10-CM | POA: Insufficient documentation

## 2010-06-19 DIAGNOSIS — N390 Urinary tract infection, site not specified: Secondary | ICD-10-CM | POA: Insufficient documentation

## 2010-06-19 DIAGNOSIS — R059 Cough, unspecified: Secondary | ICD-10-CM | POA: Insufficient documentation

## 2010-06-19 DIAGNOSIS — J40 Bronchitis, not specified as acute or chronic: Secondary | ICD-10-CM | POA: Insufficient documentation

## 2010-06-23 NOTE — Discharge Summary (Signed)
NAME:  Misty Ball, BROECKER NO.:  [id number]   MEDICAL RECORD NO.:  [id number]          PATIENT TYPE:  INP   LOCATION:  5123                         FACILITY:  [facility]   PHYSICIAN:  Paula Compton, MD        DATE OF BIRTH:  Apr 09, 1931   DATE OF ADMISSION:  06/10/2008  DATE OF DISCHARGE:  06/15/2008                               DISCHARGE SUMMARY   DISCHARGE DIAGNOSIS:  Idiopathic pancreatitis.   DISCHARGE MEDICATIONS:  1. Percocet 5/325 one to two tabs q.6 h. p.r.n. pain.  2. Xanax 0.5 mg p.o. nightly.  3. Glipizide 5 mg p.o. daily.  4. Levothyroxine 50 mcg p.o. daily.  5. Lisinopril 30 mg p.o. daily.  6. Clonazepam 1 mg p.o. nightly.  7. Simvastatin 20 mg p.o. nightly.  8. Doxepin 50 mg p.o. nightly.  9. Avandamet 05/998 mg p.o. b.i.d.   LABORATORIES AND STUDIES:  On admission, the patient had abdominal  tenderness.  She had glucose of 253.  White count of 14 and platelets of  422.  Lipase of 1930.  Her LFTs were essentially normal except for an  albumin of 3.4.  UA did not show any signs of infection.  CT acute  abdomen 2-views showed no bowel obstruction or free air, that showed  increased lung markings and cardiomegaly, and that showed a mildly  tortuous aorta.   REASON FOR HOSPITAL ADMISSION:  Abdominal pain and acute pancreatitis.   BRIEF HOSPITAL COURSE:  This is a nice 77 year old.  1. Pancreatitis.  The patient was placed n.p.o. except for ice chips      and meds, and she was given aggressive rehydration with IV fluids.      She was given morphine p.r.n. for pain and Zofran for nausea.  Her      APACHE II score is 8.  She was gradually transitioned to p.o. pain      medicines.  We advanced her diet.  By the time of discharge, she      was eating a regular diet.  We never did a CT scan as she continued      to slowly improve.  We could not really find a reason for      pancreatitis.  She does not drink alcohol.  Her gallbladder has      been removed and  then there were no medicines that we thought were      problematic.  Her triglycerides were normal at 80 with total      cholesterol of 100, HDL of 31, LDL of 53.  2. Diabetes.  We held her p.o. meds while she was not eating.  We      started her on sensitive sliding-scale insulin, checked an A1c      which is 7.4.  We sent her home on her previous home regimen.  3. Hypertension.  She is given hydralazine p.r.n. initially while she      was not taking p.o.  We will send her home on her home dose of      lisinopril.  4. History of cerebrovascular accident and  myocardial infarction.  The      patient is not on aspirin.  We would recommend starting an aspirin.      We will leave this up to her primary care physician.  5. Hyperlipidemia.  We held her Zocor while she was in-house, but she      will go home on that.  6. Insomnia.  We sent her home on clonazepam, doxepin, and Xanax as      previously prescribed.  7. Hypothyroidism, maintained her to home dose of Synthroid.  We sent      her home with a short course of Percocet for her pain.   FOLLOWUP ISSUES:  We have instructed the patient to follow up with her  primary care physician, Dr. Milus Glazier at [facility].      Asher Muir, MD  Electronically Signed      Paula Compton, MD  Electronically Signed    SO/MEDQ  D:  06/15/2008  T:  06/15/2008  Job:  [id number]   cc:   [name], M.D.

## 2010-06-23 NOTE — Op Note (Signed)
NAME:  HAEVYN, URY NO.:  [id number]   MEDICAL RECORD NO.:  [id number]          PATIENT TYPE:  OBV   LOCATION:  5114                         FACILITY:  [facility]   PHYSICIAN:  Danise Edge, M.D.   DATE OF BIRTH:  [birth year]   DATE OF PROCEDURE:  07/02/2008  DATE OF DISCHARGE:                               OPERATIVE REPORT   REFERRING PHYSICIAN:  [name], MD, [facility], [address].   HISTORY:  Misty Ball is a 77 year old female born 1931/04/09.   In November 2001, the patient was hospitalized with acute idiopathic  pancreatitis.  She had undergone a cholecystectomy.  Her MRCP was  normal.   The patient was hospitalized at [facility] from Jun 10, 2008 to  Jun 15, 2008 for treatment of etiopathic pancreatitis confirmed by over  threefold elevation in her serum lipase level and an abdominal CT scan  suggesting acute edematous pancreatitis.  At discharge, she was  tolerating a liquid diet.  Since returning home following discharge from  the hospital, she has had upper abdominal pain with persistent nausea  and vomiting unassociated with gastrointestinal bleeding.   She was readmitted to [facility] on Jun 30, 2008.  Her serum  amylase, lipase, CBC, and complete metabolic profile were unremarkable.  A repeat CT scan of the abdomen and pelvis shows a reduction in the  pancreatic inflammation and no signs of pancreatic tumors, common bile  duct stones, or pseudocyst formation.   The patient is undergoing esophagogastroduodenoscopy to rule out peptic  ulcer disease.   MEDICATION ALLERGIES:  SULFA.   PAST MEDICAL AND SURGICAL HISTORY:  Idiopathic pancreatitis requiring  hospitalization in November 2000 and in May 2010, remote  cholecystectomy, hypertension, type 2 diabetes mellitus, stroke, remote  myocardial infarction, hypothyroidism, insomnia, appendectomy,  hysterectomy.   CHRONIC MEDICATIONS:  Glipizide,  levothyroxine, simvastatin, doxepin,  Reglan, and Pancrease.   HABITS:  The patient smokes one-half pack of cigarettes per day.  She  absolutely denies alcohol consumption.   SOCIAL HISTORY:  The patient lives with her mentally retarded son.  She  is retired.   ENDOSCOPIST:  Danise Edge, MD   PREMEDICATION:  Fentanyl 30 mcg and Versed 7.5 mg.   PROCEDURE:  After obtaining informed consent, the patient was placed in  the left lateral decubitus position.  I administered intravenous  fentanyl and intravenous Versed to achieve conscious sedation for the  procedure.  The patient's blood pressure, oxygen saturation, and cardiac  rhythm were monitored throughout the procedure and documented in the  medical record.   The Pentax gastroscope was passed through the posterior hypopharynx into  the proximal esophagus without difficulty.  The hypopharynx, larynx, and  vocal cords appeared normal.   ESOPHAGOSCOPY:  The proximal mid and lower segments of the esophageal  mucosa appear normal.  The squamocolumnar junction and esophagogastric  junction are noted at approximately 40 cm from the incisor teeth.  There  was no endoscopic evidence for the presence of erosive esophagitis or  Barrett esophagus.   GASTROSCOPY:  Retroflex view of the gastric  cardia and fundus was  normal.  The gastric body appears normal.  There are few small erosions  in the gastric antrum without ulceration.  The pylorus is patent.   DUODENOSCOPY:  The duodenal bulb and descending duodenum appeared  normal.   ASSESSMENT:  Essentially normal esophagogastroduodenoscopy except for a  few small erosions in the gastric antrum.  There was no signs of gastric  outlet obstruction, peptic ulcer disease.   RECOMMENDATIONS:  1. I will started back on a regular diet.  If she has recurrent nausea      and vomiting, I would recommend scheduling her for a nuclear      medicine, solid-phase gastric emptying study carried out to  4      hours.  2. To further evaluate her two episodes of presumed idiopathic      pancreatitis, I would schedule her for an MRCP to look for pancreas      divisum and to be sure she has no signs of pancreatic ductal      obstruction from a stone or cancer.           ______________________________  Danise Edge, M.D.     MJ/MEDQ  D:  07/02/2008  T:  07/03/2008  Job:  [id number]   cc:   [name], M.D.

## 2010-06-23 NOTE — H&P (Signed)
NAME:  Misty Ball, NICHOLS NO.:  [id number]   MEDICAL RECORD NO.:  [id number]          PATIENT TYPE:  OBV   LOCATION:  5114                         FACILITY:  [facility]   PHYSICIAN:  Wayne A. Sheffield Slider, M.D.    DATE OF BIRTH:  1932/01/25   DATE OF ADMISSION:  06/30/2008  DATE OF DISCHARGE:                              HISTORY & PHYSICAL   PRIMARY CARE PHYSICIAN:  Dr. Milus Glazier at [facility].   CHIEF COMPLAINT:  Nausea, vomiting, failure to thrive.   HISTORY OF PRESENT ILLNESS:  The patient with diabetes, hypertension,  and coronary artery disease, recently treated in the hospital for  idiopathic pancreatitis from Jun 10, 2008 to Jun 15, 2008 by our service,  comes in for continued nausea and vomiting intermittently since  discharge, also with moderate diffuse abdominal pain, seen at [facility]  several times since her discharge, seen in the ER once.  A CT of abdomen  and pelvis done in the ER on Jun 18, 2008 shows mild early pancreatitis  with some common bile duct dilatation that was mild, absent gallbladder,  and duodenal diverticulum.  Of note, she had normal lipase at that time.  Since then, her PCP has discontinued her metformin, her lisinopril, her  Xanax, and her clonazepam, started her on Reglan and Pancrease.  The  Reglan seems to help a little, but she is still not really able to eat  normally.  Seen at [facility] this morning and directly admitted  from there for ongoing symptoms and a 10-pound weight loss over 3 weeks.  The patient also reports constipation, with the last bowel movement 4  days ago.  She denies fever, cough, chest pain, shortness of breath.  Of  note, she is passing gas.   PAST MEDICAL HISTORY:  Significant for pancreatitis as mentioned above,  hypertension, diabetes, history of CVA, history of an MI,  hypothyroidism, and insomnia.   PAST SURGICAL HISTORY:  Appendectomy, cholecystectomy, and hysterectomy.   ALLERGIES:  SULFA.   CURRENT MEDICATIONS:  1. Glipizide 5 mg p.o. daily.  2. Levothyroxine 50 mcg p.o. daily.  3. Simvastatin 20 mg p.o. at bedtime.  4. Doxepin 50 mg p.o. at bedtime.  5. Reglan 10 mg t.i.d. a.c. and at bedtime.  6. Pancrease 10 mL daily.   Her PCP is currently holding her Xanax, lisinopril, clonazepam,  Percocet, and metformin.   SOCIAL HISTORY:  The patient lives with her son who has mild mental  retardation.  She is retired.  She smokes half a pack of cigarettes a  day.  She denies alcohol and drug use.   FAMILY HISTORY:  Significant for mother with an MI at 40 and skin cancer  of her lip, siblings with diabetes and coronary artery disease, children  with diabetes and celiac disease.   REVIEW OF SYSTEMS:  As above.  Additionally, some lower extremity edema  in the past few days and chronic dry skin.   PHYSICAL EXAMINATION:  VITAL SIGNS:  Temperature 97.8, pulse 79,  respirations 20, blood pressure 156/51, oxygen saturation 96% on room  air.  GENERAL:  The patient is in no acute distress, lying in bed.  HEENT: Normocephalic, atraumatic.  Has relatively moist mucous  membranes.  NECK:  Supple.  CARDIOVASCULAR:  Regular rate and rhythm.  No murmur.  LUNGS:  Clear to auscultation bilaterally.  ABDOMEN:  Soft, nondistended.  She has some diffuse moderate tenderness  to palpation throughout the abdomen, but right more so than left in both  the upper and lower quadrants.  Question maybe a small ventral hernia,  but not certain about that.  She has hypoactive bowel sounds.  No  guarding.  No rebound tenderness.  EXTREMITIES:  She has 1+ pitting pretibial edema, 2+ DP pulses  bilaterally.  NEURO:  She is alert and oriented x3.  Answering questions  appropriately.  No focal findings.   There are no labs and studies at this time as she was directly admitted,  and [place] did not do any labs or studies this morning.   ASSESSMENT AND PLAN:  A 77 year old with continuing nausea,  vomiting,  and abdominal pain.  1. Nausea, vomiting, and abdominal pain, most likely chronic      pancreatitis.  We will check a CMET, CBC, lipase, LDH,      triglycerides.  We will get an abdominal 2-view and a chest x-ray.      We may need to CT her abdomen and pelvis, but I am going to hold      off on this until we get our labs back.  She did have the CT on Jun 18, 2008.  Another consideration is that, maybe, this is actually      gastroparesis given her history of diabetes, and her symptoms are      improving slightly with the Reglan.  For now, we will get studies      as above.  We will place her n.p.o., give her IV fluids.  May need      a GI consult if the diagnosis is not clear and if she is not      getting better with supportive care.  Cannot calculate her APACHE-      II score at this time as I do not have her labs back yet.  We will      give her morphine for pain.  2. Diabetes.  We will give her sensitive sliding scale insulin.  Check      CBGs q.4 h. while she is n.p.o.  3. Hypertension.  For now, we will just give her p.r.n. hydralazine      for systolic blood pressure more than 160.  4. For hyperlipidemia, coronary artery disease, and history of CVA, we      will restart her simvastatin when taking p.o.  5. Insomnia.  We will restart her doxepin when taking p.o.  In the      meantime, she can have some IV Ativan at nighttime if needed for      sleep.  6. Hypothyroid.  We will restart her levothyroxine when she is taking      p.o.  7. Prophylaxis.  We will give her Protonix and Lovenox.     Asher Muir, MD  Electronically Signed      Arnette Norris. Sheffield Slider, M.D.  Electronically Signed   SO/MEDQ  D:  06/30/2008  T:  07/01/2008  Job:  [id number]   cc:   Dr. Milus Glazier

## 2010-06-25 LAB — GLUCOSE, CAPILLARY: Glucose-Capillary: >600 mg/dL (ref 70–99)

## 2010-06-26 NOTE — Op Note (Signed)
[place]  Patient:    Misty Ball, Misty Ball                     MRN: [id number] Proc. Date: 05/20/99 Adm. Date:  [id number] Attending:  Milly Jakob                           Operative Report  There is no dictation on this patient. DD:  05/20/99 TD:  05/20/99 Job: [id number]

## 2010-06-26 NOTE — Op Note (Signed)
[place]  Patient:    Misty Ball, Misty Ball                     MRN: [id number] Proc. Date: 05/20/99 Adm. Date:  [id number] Attending:  Milly Jakob                           Operative Report  PREOPERATIVE DIAGNOSIS:  Rotator cuff tear with frozen shoulder and impingement  with acromioclavicular joint arthritis.  POSTOPERATIVE DIAGNOSES: 1. Frozen shoulder. 2. Impingement. 3. Acromioclavicular joint arthritis.  There was no rotator cuff tear.  SURGEON:  Harvie Junior, M.D.  Threasa HeadsWilla Rough.  ANESTHESIA:  General.  BRIEF HISTORY:  She is a 77 year old female with a long history of having left shoulder pain.  We have essentially been able to relieve her pain with subacromial injection, but the pain recurred.  She was having limitations of motion and pain at those limits and because of this, she was brought to the operating room for evaluation under anesthesia and arthroscopy.  She was noted to have significant  weakness preoperatively and felt to have full-thickness longstanding rotator cuff tear.   She was brought to the operating room for evaluation and arthroscopy.  PROCEDURE:  Patient brought to the operating room and after adequate anesthesia was obtained with general anesthetic, the patient placed supine on the operating table and moved into the beach chair position.  All bony prominences were well-padded and care being taken to protect all bony prominences.  At this point, the patient was put through a range of motion.  There was significant adhesions, which were broken at about 130 degrees of flexion, up to 180 degrees of flexion.  She was easily ble to get up there.  The external rotation was really not limited much, internal rotation was quite limited and this again adhesions were broken, which allowed er to get full internal rotation.  At this point, attention was turned to routine arthroscopy, which revealed that  there was a significant anterior labral tear.  This was debrided with a suction shaver and the Arthrocare wand back to a stable rim.  Attention at that time was turned to the rotator cuff, which really looked pristine on its undersurface.  There was a little bit of fray at the insertion, but really not much.  There was posterior capsular fray and posterior labral fray.  This was smoothed with an Arthrocare wand.  There was some undersurface rotator  cuff tearing and this was again smoothed with an Arthrocare wand.  Following this, attention was turned into the subacromial space, where there was noted to be a tremendous amount of bursal tissue.  This was debrided with a suction shaver and Arthrocare wand and then a subacromial decompression was performed by using the  Arthrocare and a bur from the side.  The distal clavicle was identified and the  distal clavicle excision was undertaken through the anterior portal with a 6.5 m motorized bur.  Measured probe was used and about 15 mm of anterior and 17 mm of posterior bone from the clavicle were undertaken.  Following this, attention was turned back to switching stick and the bur was used from the back in a cutting block technique and the  undersurface of the acromion was debrided.  The rotator cuff was identified.  The camera was then put in the back and the rotator cuff as evaluated from the top  side.  Really, no full-thickness defects were identified and really no significant high-grade partial thickness defects were identified. There was a tremendous amount of bursa and a shaver needed to be used to resect this nd there were bands of bursal tissue that had to be broken up and a combination of the Arthrocare and sucker shaver, we were able to free up all of the adhesions and bursal tissue up in the shoulder.  At this point, the shoulder was put through another range of motion to make sure that it could achieve a full range  of motion and in fact it could.  At that point, her wounds were copiously irrigated and suctioned dry.  Arthroscopic portals were closed with Steri-Strips.  Sterile compressive dressing was applied and she was taken to recovery room, where she as noted to be in satisfactory condition.  Estimated blood loss for the procedure as none. DD:  05/20/99 TD:  05/20/99 Job: [id number]

## 2010-06-26 NOTE — Discharge Summary (Signed)
NAME:  Misty Ball, LASKY NO.:  [id number]   MEDICAL RECORD NO.:  [id number]          PATIENT TYPE:  INP   LOCATION:  5114                         FACILITY:  [facility]   PHYSICIAN:  Leighton Roach McDiarmid, M.D.DATE OF BIRTH:  1931-05-07   DATE OF ADMISSION:  06/30/2008  DATE OF DISCHARGE:  07/06/2008                               DISCHARGE SUMMARY   PRIMARY CARE Inetta Dicke:  Dr. Milus Glazier at [facility].   DISCHARGE DIAGNOSES:  1. Pancreatic divisum.  2. Abdominal pain.  3. Possible gastric dysmotility (gastroparesis)  4. Hypertension.  5. Diabetes mellitus.  6. History of transient ischemic attack.  7. History of myocardial infarction.  8. Dyslipidemia.  9. Insomnia.  10.Hypothyroidism.   DISCHARGE MEDICATIONS:  1. Xanax 0.5 mg at bedtime p.r.n.  2. Levofloxacin 50 mcg p.o. daily.  3. Lisinopril 30 mg p.o. daily.  4. Simvastatin 20 mg p.o. daily.  5. Doxepin 50 mg p.o. at bedtime.  6. Percocet 5/325 mg 1-2 tablets q.6 hours p.r.n. pain.  7. Avandamet 05/998 mg 1 p.o. b.i.d.  8. Glipizide 5 mg p.o. daily.  9. Zofran 4 mg p.o. q.6 hours p.r.n. nausea and vomiting.  10.Reglan 5 mg p.o. q.a.c.  11.Pancreas enzymes daily.   CONSULTS:  Gastroenterology secondary to recurrent abdominal pain,  concern for pancreatitis.   PROCEDURE:  1. EGD, Jul 02, 2008, impression, normal esophagogastroduodenoscopy      except for small erosions in the gastric antrum  2. MRCP.   IMPRESSION:  Pancreatic divisum.   IMAGING:  1. CT scan of the abdomen and pelvis, Jul 01, 2008, impression,      interval improvement of inflammatory process consistent with      pancreatitis, tiny fluid collection adjacent to uncinate process.      No evidence of pseudocyst.  Duodenal diverticulum bed is unchanged.  2. Chest x-ray, impression, bibasilar scarring with improved left      basilar atelectasis, mild airway thickening.  3. Abdominal 2-view, impression, air fluid levels and nondilated  small      bowel, ileus associated with pancreatitis, not excluded.   DISCHARGE LABORATORY DATA:  C. diff negative.  BMET, sodium 138,  potassium 3.5, BUN 5, creatinine 0.58.  IgG4 within normal limits.  Hemoglobin 12.3, hematocrit 37.0, platelets 348, WBC 5.4.  TSH 2.902.   BRIEF HOSPITAL COURSE:  A 77 year old female with recent history of  idiopathic pancreatitis presented with 4 days of nausea, vomiting, and  abdominal pain which has worsened since discharge on Jun 15, 2008.  The  patient has been seen in the emergency department as an interval on Jun 18, 2008, and had a CT of the abdomen which showed early mild  pancreatitis, distal common bile duct dilatation.  Of note, the patient  has had cholecystectomy in the past.  The patient was seen again after  being seen by primary care Anaisabel Pederson at [facility] with concern  for ongoing abdominal pain.  Of note, the patient was started on Reglan  within 1 week start prior to admission.  On review of systems during  initial evaluation, the patient  denied any chest pain, shortness of  breath, or fever and was passing flatus, however, had not had a bowel  movement within 4 days.  1. Abdominal pain.  The patient with recurrent abdominal pain as      stated before.  On previous admission, had lipase that was greater      than 1900 and a workup at that time did not show any specific cause      for pancreatitis.  The patient was discharged with a diagnosis of      idiopathic pancreatitis.  Interval CT scan showed early      pancreatitis symptoms which likely led to the patient's      readmission.  Gastroenterology was consulted as the patient with      recurrent abdominal pain and concern for pancreatitis, although      lipase and amylase were within normal limits.  The patient      underwent EGD which did not show any evidence of ulcerations as      peptic ulcer disease was also a differential.  The patient      underwent MRCP which  showed pancreatic division.  On hospital day      #2, the patient was tolerating p.o. at full diet.  Therefore,      initially was discharged home with diagnosis of pancreatic divisum      and pancreatitis.  The patient subsequently continued to have      nausea and vomiting.  Therefore, the patient underwent small bowel      upper GI series which showed delay of contrast moving through the      small bowel at 2.5 hours.  Initially, primary team was to have      gastric emptying study; however, due to inability to obtain tracer      contrast this was not able to be performed in the hospital within a      timely manner.  The patient's diet was advanced throughout      admission, and the patient was tolerating p.o. prior to discharge.      As gastric emptying study was unable to be obtained, decision was      made to have the patient start Reglan 5 mg p.o. with each meal as a      trial for gastroparesis, will continue at discharge as well as      Pancrease enzymes.  Of note, other differentials were abdominal      pain include obstruction; however, there was no evidence of this on      exam or imaging and urinary findings; however, the patient did not      endorse any dysuria and other infectious gastrointestinal source as      the patient was initially constipated and had diarrhea; however, C.      diff was negative as the patient was afebrile.  The patient will      follow up with Gastroenterology as an outpatient for initial      followup visit.  2. Diabetes mellitus.  The patient's home medications were on hold and      sliding scale insulin was used.  Will continue home regimen of      Avandamet and glipizide and will follow up with primary care      Margretta Zamorano for titration of these medications.  3. Hypertension.  The patient's blood pressure remained elevated in      the 150s.  Initially hydralazine was used for  any systolics greater      than 160.  The patient was transitioned back  to home dose of      lisinopril 30 mg which she will continue at discharge.  4. Dyslipidemia and history of transient ischemic attack.  The patient      will continue statin at discharge.  This was held during admission      secondary to decreased p.o. intake, nausea, vomiting.  5. Insomnia.  The patient will continue home medication regimen of      doxepin and Xanax.  6. Hypothyroidism.  TSH was within normal limits.  Will continue      current dose of Synthroid.   ISSUES FOR FOLLOWUP:  1. Followup with diabetes mellitus, titration of medications.  2. Gastric emptying study if deemed necessary by Gastroenterology.  3. Followup on the patient's tolerance of Reglan.  Of note, was      counseled on side effects of Reglan with greater than 3 months use      in high doses.  The patient voiced understanding.   DISCHARGE INSTRUCTIONS:  Low-carb, low-fat diet, heart healthy.   ACTIVITY:  Ad lib.   FOLLOWUP APPOINTMENTS:  1. Dr. Milus Glazier, [facility], July 11, 2008, 10:30 a.m.  2. Dr. Laural Benes, Gastroenterology, July 22, 2008, 3:15 p.m.   DISCHARGE CONDITION:  Stable/improved.   DISCHARGE LOCATION:  Home.      Milinda Antis, MD  Electronically Signed      Leighton Roach McDiarmid, M.D.  Electronically Signed    KD/MEDQ  D:  07/07/2008  T:  07/08/2008  Job:  [id number]

## 2010-08-30 ENCOUNTER — Emergency Department (HOSPITAL_COMMUNITY)
Admission: EM | Admit: 2010-08-30 | Discharge: 2010-08-30 | Disposition: A | Discharge status: Home / Self Care | Payer: Medicare Other | Attending: Emergency Medicine | Admitting: Emergency Medicine

## 2010-08-30 ENCOUNTER — Emergency Department (HOSPITAL_COMMUNITY): Payer: Medicare Other

## 2010-08-30 DIAGNOSIS — R0682 Tachypnea, not elsewhere classified: Secondary | ICD-10-CM | POA: Insufficient documentation

## 2010-08-30 DIAGNOSIS — I509 Heart failure, unspecified: Secondary | ICD-10-CM | POA: Insufficient documentation

## 2010-08-30 DIAGNOSIS — R079 Chest pain, unspecified: Secondary | ICD-10-CM | POA: Insufficient documentation

## 2010-08-30 DIAGNOSIS — R509 Fever, unspecified: Secondary | ICD-10-CM | POA: Insufficient documentation

## 2010-08-30 DIAGNOSIS — R062 Wheezing: Secondary | ICD-10-CM | POA: Insufficient documentation

## 2010-08-30 DIAGNOSIS — E119 Type 2 diabetes mellitus without complications: Secondary | ICD-10-CM | POA: Insufficient documentation

## 2010-08-30 DIAGNOSIS — R0602 Shortness of breath: Secondary | ICD-10-CM | POA: Insufficient documentation

## 2010-08-30 DIAGNOSIS — Z8673 Personal history of transient ischemic attack (TIA), and cerebral infarction without residual deficits: Secondary | ICD-10-CM | POA: Insufficient documentation

## 2010-08-30 DIAGNOSIS — M549 Dorsalgia, unspecified: Secondary | ICD-10-CM | POA: Insufficient documentation

## 2010-08-30 DIAGNOSIS — J449 Chronic obstructive pulmonary disease, unspecified: Secondary | ICD-10-CM | POA: Insufficient documentation

## 2010-08-30 DIAGNOSIS — I252 Old myocardial infarction: Secondary | ICD-10-CM | POA: Insufficient documentation

## 2010-08-30 DIAGNOSIS — Z79899 Other long term (current) drug therapy: Secondary | ICD-10-CM | POA: Insufficient documentation

## 2010-08-30 DIAGNOSIS — R059 Cough, unspecified: Secondary | ICD-10-CM | POA: Insufficient documentation

## 2010-08-30 DIAGNOSIS — R0609 Other forms of dyspnea: Secondary | ICD-10-CM | POA: Insufficient documentation

## 2010-08-30 DIAGNOSIS — R61 Generalized hyperhidrosis: Secondary | ICD-10-CM | POA: Insufficient documentation

## 2010-08-30 DIAGNOSIS — E039 Hypothyroidism, unspecified: Secondary | ICD-10-CM | POA: Insufficient documentation

## 2010-08-30 DIAGNOSIS — R05 Cough: Secondary | ICD-10-CM | POA: Insufficient documentation

## 2010-08-30 DIAGNOSIS — R112 Nausea with vomiting, unspecified: Secondary | ICD-10-CM | POA: Insufficient documentation

## 2010-08-30 DIAGNOSIS — J4489 Other specified chronic obstructive pulmonary disease: Secondary | ICD-10-CM | POA: Insufficient documentation

## 2010-08-30 DIAGNOSIS — R0989 Other specified symptoms and signs involving the circulatory and respiratory systems: Secondary | ICD-10-CM | POA: Insufficient documentation

## 2010-08-30 DIAGNOSIS — I10 Essential (primary) hypertension: Secondary | ICD-10-CM | POA: Insufficient documentation

## 2010-08-30 LAB — DIFFERENTIAL
Basophils Absolute: 0 10*3/uL (ref 0.0–0.1)
Eosinophils Relative: 0 % (ref 0–5)
Lymphocytes Relative: 19 % (ref 12–46)
Lymphs Abs: 2 10*3/uL (ref 0.7–4.0)
Monocytes Absolute: 0.8 10*3/uL (ref 0.1–1.0)
Neutro Abs: 7.7 10*3/uL (ref 1.7–7.7)

## 2010-08-30 LAB — COMPREHENSIVE METABOLIC PANEL
ALT: 19 U/L (ref 0–35)
AST: 28 U/L (ref 0–37)
Albumin: 2.9 g/dL — ABNORMAL LOW (ref 3.5–5.2)
Alkaline Phosphatase: 96 U/L (ref 39–117)
Chloride: 95 mEq/L — ABNORMAL LOW (ref 96–112)
Potassium: 4.1 mEq/L (ref 3.5–5.1)
Sodium: 130 mEq/L — ABNORMAL LOW (ref 135–145)
Total Bilirubin: 0.6 mg/dL (ref 0.3–1.2)
Total Protein: 7.2 g/dL (ref 6.0–8.3)

## 2010-08-30 LAB — URINE MICROSCOPIC-ADD ON

## 2010-08-30 LAB — URINALYSIS, ROUTINE W REFLEX MICROSCOPIC
Glucose, UA: 100 mg/dL — AB
Hgb urine dipstick: NEGATIVE
Specific Gravity, Urine: 1.018 (ref 1.005–1.030)
pH: 5.5 (ref 5.0–8.0)

## 2010-08-30 LAB — CK TOTAL AND CKMB (NOT AT ARMC)
CK, MB: 1 ng/mL (ref 0.3–4.0)
Relative Index: INVALID (ref 0.0–2.5)

## 2010-08-30 LAB — CBC
HCT: 35.5 % — ABNORMAL LOW (ref 36.0–46.0)
Hemoglobin: 12.5 g/dL (ref 12.0–15.0)
MCV: 78.4 fL (ref 78.0–100.0)
RDW: 16.9 % — ABNORMAL HIGH (ref 11.5–15.5)
WBC: 10.5 10*3/uL (ref 4.0–10.5)

## 2011-02-19 ENCOUNTER — Ambulatory Visit (INDEPENDENT_AMBULATORY_CARE_PROVIDER_SITE_OTHER): Payer: Medicare Other

## 2011-02-19 DIAGNOSIS — J209 Acute bronchitis, unspecified: Secondary | ICD-10-CM

## 2011-02-19 DIAGNOSIS — R21 Rash and other nonspecific skin eruption: Secondary | ICD-10-CM

## 2011-02-19 DIAGNOSIS — L259 Unspecified contact dermatitis, unspecified cause: Secondary | ICD-10-CM

## 2011-03-25 ENCOUNTER — Ambulatory Visit: Payer: Medicare Other | Admitting: Family Medicine

## 2011-04-22 ENCOUNTER — Ambulatory Visit (INDEPENDENT_AMBULATORY_CARE_PROVIDER_SITE_OTHER): Payer: Medicare Other | Admitting: Family Medicine

## 2011-04-22 ENCOUNTER — Encounter: Payer: Self-pay | Admitting: Family Medicine

## 2011-04-22 VITALS — BP 169/82 | HR 85 | Temp 97.0°F | Resp 16 | Ht 63.0 in | Wt 154.4 lb

## 2011-04-22 DIAGNOSIS — E119 Type 2 diabetes mellitus without complications: Secondary | ICD-10-CM

## 2011-04-22 DIAGNOSIS — E039 Hypothyroidism, unspecified: Secondary | ICD-10-CM

## 2011-04-22 DIAGNOSIS — M545 Low back pain, unspecified: Secondary | ICD-10-CM

## 2011-04-22 DIAGNOSIS — M549 Dorsalgia, unspecified: Secondary | ICD-10-CM

## 2011-04-22 LAB — COMPREHENSIVE METABOLIC PANEL
ALT: 18 U/L (ref 0–35)
AST: 21 U/L (ref 0–37)
Albumin: 4 g/dL (ref 3.5–5.2)
Alkaline Phosphatase: 81 U/L (ref 39–117)
BUN: 11 mg/dL (ref 6–23)
CO2: 25 mEq/L (ref 19–32)
Calcium: 9.2 mg/dL (ref 8.4–10.5)
Chloride: 97 mEq/L (ref 96–112)
Creat: 0.69 mg/dL (ref 0.50–1.10)
Glucose, Bld: 223 mg/dL — ABNORMAL HIGH (ref 70–99)
Potassium: 4.2 mEq/L (ref 3.5–5.3)
Sodium: 134 mEq/L — ABNORMAL LOW (ref 135–145)
Total Bilirubin: 0.7 mg/dL (ref 0.3–1.2)
Total Protein: 6.9 g/dL (ref 6.0–8.3)

## 2011-04-22 LAB — TSH: TSH: 5.534 u[IU]/mL — ABNORMAL HIGH (ref 0.350–4.500)

## 2011-04-22 LAB — POCT GLYCOSYLATED HEMOGLOBIN (HGB A1C): Hemoglobin A1C: 10.6

## 2011-04-22 MED ORDER — PANCRELIPASE (LIP-PROT-AMYL) 12000-38000 UNITS PO CPEP: 1.0000 | ORAL_CAPSULE | Freq: Three times a day (TID) | ORAL | Status: DC

## 2011-04-22 MED ORDER — METHYLPREDNISOLONE 4 MG PO TABS: 4.0000 mg | ORAL_TABLET | Freq: Every day | ORAL | Status: AC

## 2011-04-22 NOTE — Progress Notes (Signed)
78 yo woman recently lost sister in law.  Her friend Harvie Heck who moved to [place] is dying with liver failure.  Britt Boozer (78 yo) is coming out here in May.  Her husband is a Associate Professor.    She recently had eye surgery and back strain.  Grand daughter recently fell and broke leg.  Another granddaughter has MS.    Back pain right side x 1 month after bending over.  Persistent.  O:  Heart:  Reg, I/VI sem, reg Chest:  Clear Ext: no edema Tender right lower paraspinal muscles.  Using cane.   Tearful when talking about Harvie Heck. Results for orders placed in visit on 04/22/11  POCT GLYCOSYLATED HEMOGLOBIN (HGB A1C)      Component Value Range   Hemoglobin A1C 10.6     A:  Stable re: cardiovasc. Back strain Still uncontrolled diabetes without peripheral neuropathy or skin changes.  Difficult home situation makes control nearly impossible.  Lifestyle changes such as smoking cessation and diet modification are not feasible.  I am just trying to be emotionally supportive at this point.  P: check a1c Multiple family stresses Medrol 4 mg qd x 7

## 2011-04-23 ENCOUNTER — Other Ambulatory Visit: Payer: Self-pay | Admitting: Family Medicine

## 2011-04-23 MED ORDER — LEVOTHYROXINE SODIUM 112 MCG PO TABS: 112.0000 ug | ORAL_TABLET | Freq: Every day | ORAL | Status: DC

## 2011-04-25 ENCOUNTER — Encounter: Payer: Self-pay | Admitting: Family Medicine

## 2011-05-27 ENCOUNTER — Ambulatory Visit (INDEPENDENT_AMBULATORY_CARE_PROVIDER_SITE_OTHER): Payer: Medicare Other | Admitting: Family Medicine

## 2011-05-27 ENCOUNTER — Encounter: Payer: Self-pay | Admitting: Family Medicine

## 2011-05-27 VITALS — BP 145/73 | HR 84 | Temp 97.5°F | Resp 18 | Ht 63.0 in | Wt 162.0 lb

## 2011-05-27 DIAGNOSIS — E039 Hypothyroidism, unspecified: Secondary | ICD-10-CM

## 2011-05-27 DIAGNOSIS — E119 Type 2 diabetes mellitus without complications: Secondary | ICD-10-CM

## 2011-05-27 LAB — TSH: TSH: 3.039 u[IU]/mL (ref 0.350–4.500)

## 2011-05-27 NOTE — Progress Notes (Signed)
78 year old woman who lives with her disabled son is coming in today for followup of her elevated blood sugar, progressive weakness, and hypothyroidism.  We started her on a higher dose of Synthroid last month (112 mcg) and need to followup to TSH today.  She continues to live a rather reclusive life, rarely going out doors or spending time with other family members away from home. We discussed her cigarettes, her diet, and her activity level as chronic issues. Recently she bent over to open a drawer for some food and fell and was unable to get herself back on her feet. We talked about doing more exercise from a chair which she agreed to do.  She is anticipating taking care of a 76 year old boy for some of the summer while her good friend Harvie Heck undergoes surgery on her liver for cancer.  Objective: Patient in no acute distress, appearing pale and rather fragile, and using cane today.  Chest: Clear to auscultation  Heart: Regular no murmur  Neck: Supple no adenopathy or bruits  Gait: Stable with use of cane but slow  skin: Clear of lesions  Extremities: Diffuse muscle atrophy on all 4 extremities, no edema  Results for orders placed in visit on 04/22/11  COMPREHENSIVE METABOLIC PANEL      Component Value Range   Sodium 134 (*) 135 - 145 (mEq/L)   Potassium 4.2  3.5 - 5.3 (mEq/L)   Chloride 97  96 - 112 (mEq/L)   CO2 25  19 - 32 (mEq/L)   Glucose, Bld 223 (*) 70 - 99 (mg/dL)   BUN 11  6 - 23 (mg/dL)   Creat 4.09  8.11 - 9.14 (mg/dL)   Total Bilirubin 0.7  0.3 - 1.2 (mg/dL)   Alkaline Phosphatase 81  39 - 117 (U/L)   AST 21  0 - 37 (U/L)   ALT 18  0 - 35 (U/L)   Total Protein 6.9  6.0 - 8.3 (g/dL)   Albumin 4.0  3.5 - 5.2 (g/dL)   Calcium 9.2  8.4 - 78.2 (mg/dL)  POCT GLYCOSYLATED HEMOGLOBIN (HGB A1C)      Component Value Range   Hemoglobin A1C 10.6    TSH      Component Value Range   TSH 5.534 (*) 0.350 - 4.500 (uIU/mL)   Assessment: This 78 year old woman is gradually  becoming weaker. Her lifestyle, reclusive and involving cigarettes, is the cause of her deterioration. She understands this but has decided not to make changes.  Plan: TSH level today, we discussed her diet in hopes that she can do better controlling her diabetes. We also discussed cigarette smoking and exercises at home.

## 2011-05-27 NOTE — Patient Instructions (Signed)
Hypothyroidism The thyroid is a large gland located in the lower front of your neck. The thyroid gland helps control metabolism. Metabolism is how your body handles food. It controls metabolism with the hormone thyroxine. When this gland is underactive (hypothyroid), it produces too little hormone.  CAUSES These include:   Absence or destruction of thyroid tissue.   Goiter due to iodine deficiency.   Goiter due to medications.   Congenital defects (since birth).   Problems with the pituitary. This causes a lack of TSH (thyroid stimulating hormone). This hormone tells the thyroid to turn out more hormone.  SYMPTOMS  Lethargy (feeling as though you have no energy)   Cold intolerance   Weight gain (in spite of normal food intake)   Dry skin   Coarse hair   Menstrual irregularity (if severe, may lead to infertility)   Slowing of thought processes  Cardiac problems are also caused by insufficient amounts of thyroid hormone. Hypothyroidism in the newborn is cretinism, and is an extreme form. It is important that this form be treated adequately and immediately or it will lead rapidly to retarded physical and mental development. DIAGNOSIS  To prove hypothyroidism, your caregiver may do blood tests and ultrasound tests. Sometimes the signs are hidden. It may be necessary for your caregiver to watch this illness with blood tests either before or after diagnosis and treatment. TREATMENT  Low levels of thyroid hormone are increased by using synthetic thyroid hormone. This is a safe, effective treatment. It usually takes about four weeks to gain the full effects of the medication. After you have the full effect of the medication, it will generally take another four weeks for problems to leave. Your caregiver may start you on low doses. If you have had heart problems the dose may be gradually increased. It is generally not an emergency to get rapidly to normal. HOME CARE INSTRUCTIONS   Take  your medications as your caregiver suggests. Let your caregiver know of any medications you are taking or start taking. Your caregiver will help you with dosage schedules.   As your condition improves, your dosage needs may increase. It will be necessary to have continuing blood tests as suggested by your caregiver.   Report all suspected medication side effects to your caregiver.  SEEK MEDICAL CARE IF: Seek medical care if you develop:  Sweating.   Tremulousness (tremors).   Anxiety.   Rapid weight loss.   Heat intolerance.   Emotional swings.   Diarrhea.   Weakness.  SEEK IMMEDIATE MEDICAL CARE IF:  You develop chest pain, an irregular heart beat (palpitations), or a rapid heart beat. MAKE SURE YOU:   Understand these instructions.   Will watch your condition.   Will get help right away if you are not doing well or get worse.  Document Released: 01/25/2005 Document Revised: 01/14/2011 Document Reviewed: 09/15/2007 [facility] Patient Information 2012 [place], [place].Diabetes Meal Planning Guide The diabetes meal planning guide is a tool to help you plan your meals and snacks. It is important for people with diabetes to manage their blood glucose (sugar) levels. Choosing the right foods and the right amounts throughout your day will help control your blood glucose. Eating right can even help you improve your blood pressure and reach or maintain a healthy weight. CARBOHYDRATE COUNTING MADE EASY When you eat carbohydrates, they turn to sugar. This raises your blood glucose level. Counting carbohydrates can help you control this level so you feel better. When you plan your meals by  counting carbohydrates, you can have more flexibility in what you eat and balance your medicine with your food intake. Carbohydrate counting simply means adding up the total amount of carbohydrate grams in your meals and snacks. Try to eat about the same amount at each meal. Foods with carbohydrates are  listed below. Each portion below is 1 carbohydrate serving or 15 grams of carbohydrates. Ask your dietician how many grams of carbohydrates you should eat at each meal or snack. Grains and Starches  1 slice bread.    English muffin or hotdog/hamburger bun.    cup cold cereal (unsweetened).   ? cup cooked pasta or rice.    cup starchy vegetables (corn, potatoes, peas, beans, winter squash).   1 tortilla (6 inches).    bagel.   1 waffle or pancake (size of a CD).    cup cooked cereal.   4 to 6 small crackers.  *Whole grain is recommended. Fruit  1 cup fresh unsweetened berries, melon, papaya, pineapple.   1 small fresh fruit.    banana or mango.    cup fruit juice (4 oz unsweetened).    cup canned fruit in natural juice or water.   2 tbs dried fruit.   12 to 15 grapes or cherries.  Milk and Yogurt  1 cup fat-free or 1% milk.   1 cup soy milk.   6 oz light yogurt with sugar-free sweetener.   6 oz low-fat soy yogurt.   6 oz plain yogurt.  Vegetables  1 cup raw or  cup cooked is counted as 0 carbohydrates or a "free" food.   If you eat 3 or more servings at 1 meal, count them as 1 carbohydrate serving.  Other Carbohydrates   oz chips or pretzels.    cup ice cream or frozen yogurt.    cup sherbet or sorbet.   2 inch square cake, no frosting.   1 tbs honey, sugar, jam, jelly, or syrup.   2 small cookies.   3 squares of graham crackers.   3 cups popcorn.   6 crackers.   1 cup broth-based soup.   Count 1 cup casserole or other mixed foods as 2 carbohydrate servings.   Foods with less than 20 calories in a serving may be counted as 0 carbohydrates or a "free" food.  You may want to purchase a book or computer software that lists the carbohydrate gram counts of different foods. In addition, the nutrition facts panel on the labels of the foods you eat are a good source of this information. The label will tell you how big the serving size  is and the total number of carbohydrate grams you will be eating per serving. Divide this number by 15 to obtain the number of carbohydrate servings in a portion. Remember, 1 carbohydrate serving equals 15 grams of carbohydrate. SERVING SIZES Measuring foods and serving sizes helps you make sure you are getting the right amount of food. The list below tells how big or small some common serving sizes are.  1 oz.........4 stacked dice.   3 oz........Marland KitchenDeck of cards.   1 tsp.......Marland KitchenTip of little finger.   1 tbs......Marland KitchenMarland KitchenThumb.   2 tbs.......Marland KitchenGolf ball.    cup......Marland KitchenHalf of a fist.   1 cup.......Marland KitchenA fist.  SAMPLE DIABETES MEAL PLAN Below is a sample meal plan that includes foods from the grain and starches, dairy, vegetable, fruit, and meat groups. A dietician can individualize a meal plan to fit your calorie needs and tell you the  number of servings needed from each food group. However, controlling the total amount of carbohydrates in your meal or snack is more important than making sure you include all of the food groups at every meal. You may interchange carbohydrate containing foods (dairy, starches, and fruits). The meal plan below is an example of a 2000 calorie diet using carbohydrate counting. This meal plan has 17 carbohydrate servings. Breakfast  1 cup oatmeal (2 carb servings).    cup light yogurt (1 carb serving).   1 cup blueberries (1 carb serving).    cup almonds.  Snack  1 large apple (2 carb servings).   1 low-fat string cheese stick.  Lunch  Chicken breast salad.   1 cup spinach.    cup chopped tomatoes.   2 oz chicken breast, sliced.   2 tbs low-fat [place] dressing.   12 whole-wheat crackers (2 carb servings).   12 to 15 grapes (1 carb serving).   1 cup low-fat milk (1 carb serving).  Snack  1 cup carrots.    cup hummus (1 carb serving).  Dinner  3 oz broiled salmon.   1 cup brown rice (3 carb servings).  Snack  1  cups steamed broccoli  (1 carb serving) drizzled with 1 tsp olive oil and lemon juice.   1 cup light pudding (2 carb servings).  DIABETES MEAL PLANNING WORKSHEET Your dietician can use this worksheet to help you decide how many servings of foods and what types of foods are right for you.  BREAKFAST Food Group and Servings / Carb Servings Grain/Starches __________________________________ Dairy __________________________________________ Vegetable ______________________________________ Fruit ___________________________________________ Meat __________________________________________ Fat ____________________________________________ LUNCH Food Group and Servings / Carb Servings Grain/Starches ___________________________________ Dairy ___________________________________________ Fruit ____________________________________________ Meat ___________________________________________ Fat _____________________________________________ [place] Food Group and Servings / Carb Servings Grain/Starches ___________________________________ Dairy ___________________________________________ Fruit ____________________________________________ Meat ___________________________________________ Fat _____________________________________________ SNACKS Food Group and Servings / Carb Servings Grain/Starches ___________________________________ Dairy ___________________________________________ Vegetable _______________________________________ Fruit ____________________________________________ Meat ___________________________________________ Fat _____________________________________________ DAILY TOTALS Starches _________________________ Vegetable ________________________ Fruit ____________________________ Dairy ____________________________ Meat ____________________________ Fat ______________________________ Document Released: 10/22/2004 Document Revised: 01/14/2011 Document Reviewed: 09/02/2008 [employer] Patient Information 2012  [place], [place].

## 2011-05-28 ENCOUNTER — Encounter: Payer: Self-pay | Admitting: *Deleted

## 2011-06-06 ENCOUNTER — Emergency Department (HOSPITAL_COMMUNITY): Payer: Medicare Other

## 2011-06-06 ENCOUNTER — Emergency Department (HOSPITAL_COMMUNITY)
Admission: EM | Admit: 2011-06-06 | Discharge: 2011-06-06 | Disposition: A | Discharge status: Home / Self Care | Payer: Medicare Other | Attending: Emergency Medicine | Admitting: Emergency Medicine

## 2011-06-06 ENCOUNTER — Encounter (HOSPITAL_COMMUNITY): Payer: Self-pay | Admitting: Emergency Medicine

## 2011-06-06 DIAGNOSIS — N390 Urinary tract infection, site not specified: Secondary | ICD-10-CM | POA: Insufficient documentation

## 2011-06-06 DIAGNOSIS — W19XXXA Unspecified fall, initial encounter: Secondary | ICD-10-CM | POA: Insufficient documentation

## 2011-06-06 DIAGNOSIS — M25559 Pain in unspecified hip: Secondary | ICD-10-CM | POA: Insufficient documentation

## 2011-06-06 DIAGNOSIS — I10 Essential (primary) hypertension: Secondary | ICD-10-CM | POA: Insufficient documentation

## 2011-06-06 DIAGNOSIS — Z794 Long term (current) use of insulin: Secondary | ICD-10-CM | POA: Insufficient documentation

## 2011-06-06 DIAGNOSIS — E119 Type 2 diabetes mellitus without complications: Secondary | ICD-10-CM | POA: Insufficient documentation

## 2011-06-06 HISTORY — DX: Essential (primary) hypertension: I10

## 2011-06-06 HISTORY — DX: Acute pancreatitis without necrosis or infection, unspecified: K85.90

## 2011-06-06 HISTORY — DX: Disorder of thyroid, unspecified: E07.9

## 2011-06-06 LAB — URINALYSIS, ROUTINE W REFLEX MICROSCOPIC
Bilirubin Urine: NEGATIVE
Glucose, UA: NEGATIVE mg/dL
Ketones, ur: NEGATIVE mg/dL
Nitrite: POSITIVE — AB
Protein, ur: NEGATIVE mg/dL
Specific Gravity, Urine: 1.01 (ref 1.005–1.030)
Urobilinogen, UA: 0.2 mg/dL (ref 0.0–1.0)
pH: 6 (ref 5.0–8.0)

## 2011-06-06 LAB — URINE MICROSCOPIC-ADD ON

## 2011-06-06 MED ORDER — IBUPROFEN 200 MG PO TABS
400.0000 mg | ORAL_TABLET | Freq: Once | ORAL | Status: AC
  Administered 2011-06-06: 400 mg via ORAL
  Filled 2011-06-06: qty 2

## 2011-06-06 MED ORDER — SULFAMETHOXAZOLE-TMP DS 800-160 MG PO TABS
1.0000 | ORAL_TABLET | Freq: Once | ORAL | Status: AC
  Administered 2011-06-06: 1 via ORAL
  Filled 2011-06-06: qty 1

## 2011-06-06 MED ORDER — HYDROMORPHONE HCL PF 1 MG/ML IJ SOLN
1.0000 mg | Freq: Once | INTRAMUSCULAR | Status: AC
  Administered 2011-06-06: 1 mg via INTRAMUSCULAR
  Filled 2011-06-06: qty 1

## 2011-06-06 MED ORDER — TRAMADOL HCL 50 MG PO TABS: 50.0000 mg | ORAL_TABLET | Freq: Four times a day (QID) | ORAL | Status: DC | PRN

## 2011-06-06 MED ORDER — DIAZEPAM 5 MG/ML IJ SOLN
5.0000 mg | Freq: Once | INTRAMUSCULAR | Status: AC
  Administered 2011-06-06: 5 mg via INTRAMUSCULAR
  Filled 2011-06-06: qty 2

## 2011-06-06 MED ORDER — SULFAMETHOXAZOLE-TRIMETHOPRIM 800-160 MG PO TABS: 1.0000 | ORAL_TABLET | Freq: Two times a day (BID) | ORAL | Status: AC

## 2011-06-06 NOTE — ED Notes (Signed)
[id number]<AV> Expected date:<BR> Expected time: 1:04 PM<BR> Means of arrival:Ambulance<BR> Comments:<BR> M31 -- Side/Hip Pain (post fall (1 week))

## 2011-06-06 NOTE — Discharge Instructions (Signed)

## 2011-06-06 NOTE — ED Notes (Signed)
Pt. Misty Ball one week ago injured right arm and now having pain in lower right quadrant radiating to right hip.  Pain comes and goes and sometimes radiates to chest.  Pt. Reports having history of pancreatitis and walks with a walker.  Seen by her primary care MD for fall and now out of pain medication.

## 2011-06-10 NOTE — ED Provider Notes (Signed)
History    78 year old female presenting with right hip and right lower quadrant pain. Gradual onset about a day ago. Patient reports mechanical fall about a week ago and changed her right arm. She relates her current pain to then although she was not having the same pain initially. No fevers or chills. No urinary complaints. No nausea or vomiting. No unusual vaginal bleeding or discharge. CSN: [id number]  Arrival date & time 06/06/11  1314   First MD Initiated Contact with Patient 06/06/11 1326      No chief complaint on file.   (Consider location/radiation/quality/duration/timing/severity/associated sxs/prior treatment) HPI  Past Medical History  Diagnosis Date  . Hypertension   . Pancreatitis   . Diabetes mellitus   . Thyroid disease     Past Surgical History  Procedure Date  . Abdominal hysterectomy   . Cholecystectomy   . Appendectomy     No family history on file.  History  Substance Use Topics  . Smoking status: Current Everyday Smoker -- 0.5 packs/day for 52 years    Types: Cigarettes  . Smokeless tobacco: Not on file  . Alcohol Use: No    OB History    Grav Para Term Preterm Abortions TAB SAB Ect Mult Living                  Review of Systems  Review of symptoms negative unless otherwise noted in HPI.   Allergies  Sulfonamide derivatives  Home Medications   Current Outpatient Rx  Name Route Sig Dispense Refill  . ASPIRIN EC 81 MG PO TBEC Oral Take 81 mg by mouth daily.    [name] CLONAZEPAM 1 MG PO TABS Oral Take 1 mg by mouth at bedtime.    [name] DOXEPIN HCL 25 MG PO CAPS Oral Take 25 mg by mouth at bedtime.    . INSULIN GLARGINE 100 UNIT/ML [place] SOLN Subcutaneous Inject 40 Units into the skin daily.    [name] LEVOTHYROXINE SODIUM 112 MCG PO TABS Oral Take 1 tablet (112 mcg total) by mouth daily. 90 tablet 3  . PANCRELIPASE (LIP-PROT-AMYL) 12000 UNITS PO CPEP Oral Take 1 capsule by mouth 3 (three) times daily before meals. 270 capsule 11  .  SULFAMETHOXAZOLE-TRIMETHOPRIM 800-160 MG PO TABS Oral Take 1 tablet by mouth every 12 (twelve) hours. 10 tablet 0  . TRAMADOL HCL 50 MG PO TABS Oral Take 1 tablet (50 mg total) by mouth every 6 (six) hours as needed for pain. 10 tablet 0    BP 121/87  Pulse 70  Temp(Src) 98.4 F (36.9 C) (Oral)  Resp 20  SpO2 96%  Physical Exam  Nursing note and vitals reviewed. Constitutional: She appears well-developed and well-nourished. No distress.       Laying in bed. No acute distress  HENT:  Head: Normocephalic and atraumatic.  Eyes: Conjunctivae are normal. Right eye exhibits no discharge. Left eye exhibits no discharge.  Neck: Neck supple.  Cardiovascular: Normal rate, regular rhythm and normal heart sounds.  Exam reveals no gallop and no friction rub.   No murmur heard. Pulmonary/Chest: Effort normal and breath sounds normal. No respiratory distress.  Abdominal: Soft. She exhibits no distension. There is no tenderness.  Genitourinary:       No CVA tenderness  Musculoskeletal: She exhibits no edema and no tenderness.  Neurological: She is alert.  Skin: Skin is warm and dry. She is not diaphoretic.  Psychiatric: She has a normal mood and affect. Her behavior is normal. Thought content normal.  ED Course  Procedures (including critical care time)  Labs Reviewed  URINALYSIS, ROUTINE W REFLEX MICROSCOPIC - Abnormal; Notable for the following:    APPearance CLOUDY (*)    Hgb urine dipstick TRACE (*)    Nitrite POSITIVE (*)    Leukocytes, UA MODERATE (*)    All other components within normal limits  URINE MICROSCOPIC-ADD ON - Abnormal; Notable for the following:    Bacteria, UA MANY (*)    All other components within normal limits  LAB REPORT - SCANNED   No results found.   1. UTI (urinary tract infection)       MDM  78 year old female with lower, pain. Patient reports hip pain but suspect this is more right lower quadrant abdominal pain. Likely secondary to UTI. Abdominal  exam is benign and abdominal imaging deferred. Musculoskeletal exam is benign 2. UA is consistent with infection. X-ray does not show any acute abnormalities. Plan course of antibiotics. Return precautions were discussed. Outpatient followup otherwise        Raeford Razor, MD 06/10/11 1421

## 2011-06-12 ENCOUNTER — Other Ambulatory Visit: Payer: Self-pay | Admitting: Family Medicine

## 2011-08-05 ENCOUNTER — Ambulatory Visit (INDEPENDENT_AMBULATORY_CARE_PROVIDER_SITE_OTHER): Payer: Medicare Other | Admitting: Family Medicine

## 2011-08-05 ENCOUNTER — Encounter: Payer: Self-pay | Admitting: Family Medicine

## 2011-08-05 VITALS — BP 155/85 | HR 78 | Temp 97.7°F | Resp 20 | Ht 63.0 in | Wt 152.2 lb

## 2011-08-05 DIAGNOSIS — IMO0001 Reserved for inherently not codable concepts without codable children: Secondary | ICD-10-CM

## 2011-08-05 DIAGNOSIS — R3589 Other polyuria: Secondary | ICD-10-CM

## 2011-08-05 DIAGNOSIS — B351 Tinea unguium: Secondary | ICD-10-CM

## 2011-08-05 DIAGNOSIS — L219 Seborrheic dermatitis, unspecified: Secondary | ICD-10-CM

## 2011-08-05 DIAGNOSIS — M545 Low back pain, unspecified: Secondary | ICD-10-CM

## 2011-08-05 DIAGNOSIS — E119 Type 2 diabetes mellitus without complications: Secondary | ICD-10-CM

## 2011-08-05 DIAGNOSIS — M549 Dorsalgia, unspecified: Secondary | ICD-10-CM

## 2011-08-05 DIAGNOSIS — E538 Deficiency of other specified B group vitamins: Secondary | ICD-10-CM

## 2011-08-05 DIAGNOSIS — R358 Other polyuria: Secondary | ICD-10-CM

## 2011-08-05 DIAGNOSIS — L821 Other seborrheic keratosis: Secondary | ICD-10-CM

## 2011-08-05 LAB — POCT UA - MICROSCOPIC ONLY
Casts, Ur, LPF, POC: NEGATIVE
Crystals, Ur, HPF, POC: NEGATIVE
Yeast, UA: NEGATIVE

## 2011-08-05 LAB — POCT URINALYSIS DIPSTICK
Bilirubin, UA: NEGATIVE
Glucose, UA: NEGATIVE
Ketones, UA: NEGATIVE
Nitrite, UA: NEGATIVE
Spec Grav, UA: <=1.005
Urobilinogen, UA: 0.2
pH, UA: 6

## 2011-08-05 LAB — POCT GLYCOSYLATED HEMOGLOBIN (HGB A1C): Hemoglobin A1C: 9.8

## 2011-08-05 MED ORDER — CIPROFLOXACIN HCL 250 MG PO TABS: 250.0000 mg | ORAL_TABLET | Freq: Two times a day (BID) | ORAL | Status: AC

## 2011-08-05 MED ORDER — BETAMETHASONE DIPROPIONATE 0.05 % EX LOTN: TOPICAL_LOTION | Freq: Two times a day (BID) | CUTANEOUS | Status: DC

## 2011-08-05 MED ORDER — CYCLOBENZAPRINE HCL 5 MG PO TABS: ORAL_TABLET | ORAL | Status: DC

## 2011-08-05 MED ORDER — CYANOCOBALAMIN 1000 MCG/ML IJ SOLN
1000.0000 ug | Freq: Once | INTRAMUSCULAR | Status: AC
  Administered 2011-08-05: 1000 ug via INTRAMUSCULAR

## 2011-08-05 NOTE — Progress Notes (Signed)
@[email or web address] @  Patient ID: Misty Ball MRN: [id number], DOB: May 11, 1931, 78 y.o. Date of Encounter: 08/05/2011, 10:50 AM  Primary Physician: [name], MD  Chief Complaint: Diabetes follow up  HPI: 78 y.o. year old female with history below presents for follow up of diabetes mellitus. Doing well. No issues or complaints. Taking medications daily without adverse effects. No polydipsia, polyphagia, polyuria, or nocturia.  Blood sugars at home: Diet consists of  Recent ED visit in April for UTI, given shots in each arm and prescribed Ultram and Septra.  No dysuria but urgency persists, polyuria persists as well.  Back pain (lower) perists.  Eye MD:  May 2013:  New prescription pending.  Laser to left eye.  Will have follow up December 11. Influenza vaccine: 2012 Pneumococcal vaccine: 2012  Recent birthday celebration by her boys and family.  She ate the wrong food and began vomiting.  Past Medical History  Diagnosis Date  . Hypertension   . Pancreatitis   . Diabetes mellitus   . Thyroid disease      Home Meds: Prior to Admission medications   Medication Sig Start Date End Date Taking? Authorizing Provider  aspirin EC 81 MG tablet Take 81 mg by mouth daily.   Yes Historical Provider, MD  B-D ULTRAFINE III SHORT PEN 31G X 8 MM MISC USE AS DIRECTED DAILY 06/12/11  Yes Chelle S Jeffery, PA-C  clonazePAM (KLONOPIN) 1 MG tablet Take 1 mg by mouth at bedtime.   Yes Historical Provider, MD  doxepin (SINEQUAN) 25 MG capsule Take 25 mg by mouth at bedtime.   Yes Historical Provider, MD  insulin glargine (LANTUS SOLOSTAR) 100 UNIT/ML injection Inject 40 Units into the skin daily.   Yes Historical Provider, MD  levothyroxine (SYNTHROID, LEVOTHROID) 112 MCG tablet Take 1 tablet (112 mcg total) by mouth daily. 04/23/11 04/22/12 Yes [name], MD  lipase/protease/amylase (CREON-10/PANCREASE) 12000 UNITS CPEP Take 1 capsule by mouth 3 (three) times daily before meals. 04/22/11  Yes [name], MD    Allergies:  Allergies  Allergen Reactions  . Sulfonamide Derivatives     History   Social History  . Marital Status: Widowed    Spouse Name: N/A    Number of Children: N/A  . Years of Education: N/A   Occupational History  . Not on file.   Social History Main Topics  . Smoking status: Current Everyday Smoker -- 0.5 packs/day for 52 years    Types: Cigarettes  . Smokeless tobacco: Not on file  . Alcohol Use: No  . Drug Use: Not on file  . Sexually Active: Not on file   Other Topics Concern  . Not on file   Social History Narrative  . No narrative on file     Review of Systems: Constitutional: negative for chills, fever, night sweats, weight changes, or fatigue  HEENT: negative for vision changes, hearing loss, congestion, rhinorrhea, or epistaxis Cardiovascular: negative for chest pain, palpitations, diaphoresis, DOE, orthopnea, or edema Respiratory: negative for hemoptysis, wheezing, shortness of breath, dyspnea, or cough Abdominal: negative for abdominal pain, nausea, vomiting, diarrhea, or constipation Dermatological: negative for rash, erythema, or wounds Neurologic: negative for headache, dizziness, or syncope Renal:  Negative for polyuria, polydipsia, or dysuria All other systems reviewed and are otherwise negative with the exception to those above and in the HPI.   Physical Exam: Blood pressure 155/85, pulse 78, temperature 97.7 F (36.5 C), temperature source Oral, resp. rate 20, height 5\' 3"  (1.6 m), weight 152 lb 3.2  oz (69.037 kg)., Body mass index is 26.96 kg/(m^2). General: Well developed, well nourished, in no acute distress. Head: Normocephalic, atraumatic, eyes without discharge, sclera non-icteric, nares are without discharge. Bilateral auditory canals clear, TM's are without perforation, pearly grey and translucent with reflective cone of light bilaterally. Oral cavity moist, posterior pharynx without exudate, erythema,  peritonsillar abscess, or post nasal drip.  Neck: Supple. No thyromegaly. Full ROM. No lymphadenopathy. Lungs: Clear bilaterally to auscultation without wheezes, rales, or rhonchi. Breathing is unlabored. Heart: RRR with S1 S2. No murmurs, rubs, or gallops appreciated. Abdomen: Soft, non-tender, non-distended with normoactive bowel sounds. No hepatosplenomegaly. No rebound/guarding. No obvious abdominal masses. Msk:  Strength and tone normal for age. Extremities/Skin: Warm and dry. No clubbing or cyanosis. No edema. No rashes, wounds, or suspicious lesions. Monofilament exam unremarkable bilaterally. Pink scaly scalp.  Onychomycotic nails Neuro: Alert and oriented X 3. Moves all extremities spontaneously. Gait is normal. CNII-XII grossly in tact. Psych:  Responds to questions appropriately with a normal affect.   Labs: Results for orders placed in visit on 08/05/11  POCT URINALYSIS DIPSTICK      Component Value Range   Color, UA yellow     Clarity, UA slightly cloudy     Glucose, UA neg     Bilirubin, UA neg     Ketones, UA neg     Spec Grav, UA <=1.005     Blood, UA small     pH, UA 6.0     Protein, UA trace     Urobilinogen, UA 0.2     Nitrite, UA neg     Leukocytes, UA large (3+)        ASSESSMENT AND PLAN:  78 y.o. year old female with UTI and back pain.  Also, seborrhea of scalp 1. Polyuria  POCT UA - Microscopic Only, POCT urinalysis dipstick, Urine culture, ciprofloxacin (CIPRO) 250 MG tablet  2. Back pain  Urine culture, cyclobenzaprine (FLEXERIL) 5 MG tablet  3. B12 deficiency  cyanocobalamin ((VITAMIN B-12)) injection 1,000 mcg  4. Seborrhea  betamethasone dipropionate 0.05 % lotion  5. Diabetes mellitus, type 2  POCT glycosylated hemoglobin (Hb A1C)  6. Onychomycosis  Ambulatory referral to Podiatry   -  Signed, [name], MD 08/05/2011 10:50 AM

## 2011-08-07 LAB — URINE CULTURE: Colony Count: >=100000

## 2011-08-28 ENCOUNTER — Other Ambulatory Visit: Payer: Self-pay | Admitting: Family Medicine

## 2011-09-09 ENCOUNTER — Ambulatory Visit (INDEPENDENT_AMBULATORY_CARE_PROVIDER_SITE_OTHER): Payer: Medicare Other | Admitting: Family Medicine

## 2011-09-09 ENCOUNTER — Encounter: Payer: Self-pay | Admitting: Family Medicine

## 2011-09-09 VITALS — BP 127/69 | HR 83 | Temp 98.1°F | Resp 17 | Wt 152.0 lb

## 2011-09-09 DIAGNOSIS — E538 Deficiency of other specified B group vitamins: Secondary | ICD-10-CM

## 2011-09-09 DIAGNOSIS — F419 Anxiety disorder, unspecified: Secondary | ICD-10-CM

## 2011-09-09 DIAGNOSIS — E039 Hypothyroidism, unspecified: Secondary | ICD-10-CM

## 2011-09-09 DIAGNOSIS — K859 Acute pancreatitis without necrosis or infection, unspecified: Secondary | ICD-10-CM

## 2011-09-09 DIAGNOSIS — M549 Dorsalgia, unspecified: Secondary | ICD-10-CM

## 2011-09-09 DIAGNOSIS — E119 Type 2 diabetes mellitus without complications: Secondary | ICD-10-CM

## 2011-09-09 DIAGNOSIS — E109 Type 1 diabetes mellitus without complications: Secondary | ICD-10-CM

## 2011-09-09 DIAGNOSIS — R269 Unspecified abnormalities of gait and mobility: Secondary | ICD-10-CM

## 2011-09-09 LAB — POCT GLYCOSYLATED HEMOGLOBIN (HGB A1C): Hemoglobin A1C: 10.4

## 2011-09-09 LAB — COMPREHENSIVE METABOLIC PANEL
ALT: 17 U/L (ref 0–35)
AST: 27 U/L (ref 0–37)
Albumin: 3.9 g/dL (ref 3.5–5.2)
Alkaline Phosphatase: 77 U/L (ref 39–117)
BUN: 9 mg/dL (ref 6–23)
CO2: 27 mEq/L (ref 19–32)
Calcium: 8.9 mg/dL (ref 8.4–10.5)
Chloride: 100 mEq/L (ref 96–112)
Creat: 0.65 mg/dL (ref 0.50–1.10)
Glucose, Bld: 192 mg/dL — ABNORMAL HIGH (ref 70–99)
Potassium: 4.5 mEq/L (ref 3.5–5.3)
Sodium: 136 mEq/L (ref 135–145)
Total Bilirubin: 0.5 mg/dL (ref 0.3–1.2)
Total Protein: 6.7 g/dL (ref 6.0–8.3)

## 2011-09-09 MED ORDER — CYANOCOBALAMIN 1000 MCG/ML IJ SOLN
1000.0000 ug | INTRAMUSCULAR | Status: DC
  Administered 2011-09-09 – 2011-10-14 (×2): 1000 ug via INTRAMUSCULAR

## 2011-09-09 MED ORDER — PANCRELIPASE (LIP-PROT-AMYL) 12000-38000 UNITS PO CPEP: 1.0000 | ORAL_CAPSULE | Freq: Three times a day (TID) | ORAL | Status: DC

## 2011-09-09 MED ORDER — CLONAZEPAM 1 MG PO TABS: 1.0000 mg | ORAL_TABLET | Freq: Every day | ORAL | Status: DC

## 2011-09-09 MED ORDER — CYCLOBENZAPRINE HCL 5 MG PO TABS: ORAL_TABLET | ORAL | Status: DC

## 2011-09-09 MED ORDER — DOXEPIN HCL 25 MG PO CAPS: 25.0000 mg | ORAL_CAPSULE | Freq: Every day | ORAL | Status: DC

## 2011-09-09 MED ORDER — INSULIN GLARGINE 100 UNIT/ML ~~LOC~~ SOLN: 35.0000 [IU] | Freq: Every day | SUBCUTANEOUS | Status: DC

## 2011-09-09 MED ORDER — LEVOTHYROXINE SODIUM 112 MCG PO TABS: 112.0000 ug | ORAL_TABLET | Freq: Every day | ORAL | Status: DC

## 2011-09-09 NOTE — Progress Notes (Signed)
@[email or web address] @  Patient ID: Misty Ball MRN: [id number], DOB: Jul 15, 1931, 78 y.o. Date of Encounter: 09/09/2011, 12:15 PM  Primary Physician: [name], MD  Chief Complaint: Diabetes follow up  HPI: 78 y.o. year old female with history below presents for follow up of diabetes mellitus. Doing well. No issues or complaints. Taking medications daily without adverse effects. No polydipsia, polyphagia, polyuria, or nocturia.  Fell 5 days ago but did not hurt herself.  Notes increasing weight and appetite without any new significant abdominal pain.  She does have a funny sensation of something moving around in her stomach. She has been outside walking around the house more to build up her strength.   Past Medical History  Diagnosis Date  . Hypertension   . Pancreatitis   . Diabetes mellitus   . Thyroid disease      Home Meds: Prior to Admission medications   Medication Sig Start Date End Date Taking? Authorizing Provider  aspirin EC 81 MG tablet Take 81 mg by mouth daily.   Yes Historical Provider, MD  B-D ULTRAFINE III SHORT PEN 31G X 8 MM MISC USE AS DIRECTED DAILY 06/12/11  Yes Chelle S Jeffery, PA-C  betamethasone dipropionate 0.05 % lotion Apply topically 2 (two) times daily. 08/05/11 08/04/12 Yes [name], MD  clonazePAM (KLONOPIN) 1 MG tablet Take 1 tablet (1 mg total) by mouth at bedtime. 09/09/11  Yes [name], MD  doxepin (SINEQUAN) 25 MG capsule Take 1 capsule (25 mg total) by mouth at bedtime. 09/09/11  Yes [name], MD  insulin glargine (LANTUS SOLOSTAR) 100 UNIT/ML injection Inject 35 Units into the skin daily. 09/09/11  Yes [name], MD  levothyroxine (SYNTHROID, LEVOTHROID) 112 MCG tablet Take 1 tablet (112 mcg total) by mouth daily. 09/09/11 09/08/12 Yes [name], MD  lipase/protease/amylase (CREON-10/PANCREASE) 12000 UNITS CPEP Take 1 capsule by mouth 3 (three) times daily before meals. 09/09/11  Yes [name], MD  cyclobenzaprine (FLEXERIL) 5  MG tablet qhs for back pain 09/09/11   [name], MD    Allergies:  Allergies  Allergen Reactions  . Sulfonamide Derivatives     History   Social History  . Marital Status: Widowed    Spouse Name: N/A    Number of Children: N/A  . Years of Education: N/A   Occupational History  . Not on file.   Social History Main Topics  . Smoking status: Current Everyday Smoker -- 0.5 packs/day for 52 years    Types: Cigarettes  . Smokeless tobacco: Not on file  . Alcohol Use: No  . Drug Use: Not on file  . Sexually Active: Not on file   Other Topics Concern  . Not on file   Social History Narrative  . No narrative on file     Review of Systems: Constitutional: negative for chills, fever, night sweats, weight changes, or fatigue  HEENT: negative for vision changes, hearing loss, congestion, rhinorrhea, or epistaxis Cardiovascular: negative for chest pain, palpitations, diaphoresis, DOE, orthopnea, or edema Respiratory: negative for hemoptysis, wheezing, shortness of breath, dyspnea, or cough Abdominal: negative for abdominal pain, nausea, vomiting, diarrhea, or constipation Dermatological: negative for rash, erythema, or wounds Neurologic: negative for headache, dizziness, or syncope Renal:  Negative for polyuria, polydipsia, or dysuria All other systems reviewed and are otherwise negative with the exception to those above and in the HPI.   Physical Exam: Blood pressure 127/69, pulse 83, temperature 98.1 F (36.7 C), temperature source Oral, resp. rate 17, weight 152 lb (68.947 kg).,  There is no height on file to calculate BMI. General: Well developed, well nourished, in no acute distress. Head: Normocephalic, atraumatic, eyes without discharge, sclera non-icteric, nares are without discharge. Bilateral auditory canals clear, TM's are without perforation, pearly grey and translucent with reflective cone of light bilaterally. Oral cavity moist, posterior pharynx without  exudate, erythema, peritonsillar abscess, or post nasal drip.  Neck: Supple. No thyromegaly. Full ROM. No lymphadenopathy. Lungs: Clear bilaterally to auscultation without wheezes, rales, or rhonchi. Breathing is unlabored. Heart: RRR with S1 S2. No murmurs, rubs, or gallops appreciated. Abdomen: Soft, non-tender, non-distended with normoactive bowel sounds. No hepatosplenomegaly. No rebound/guarding. No obvious abdominal masses. Msk:  Strength and tone normal for age. Extremities/Skin: Warm and dry. No clubbing or cyanosis. No edema. No rashes, wounds, or suspicious lesions. Monofilament exam unremarkable bilaterally.  Neuro: Alert and oriented X 3. Moves all extremities spontaneously. Gait is normal. CNII-XII grossly in tact. Psych:  Responds to questions appropriately with a normal affect.   Labs: Results for orders placed in visit on 09/09/11  POCT GLYCOSYLATED HEMOGLOBIN (HGB A1C)      Component Value Range   Hemoglobin A1C 10.4        ASSESSMENT AND PLAN:  78 y.o. year old female with IDDM.  Recent fall without sequelae.  I am most concerned about weight gain and therefore am reducing the Lantus from 40 to 35 units daily.  We'll recheck this in a month with patient encouraged to avoid further weight gain and continue some exercise daily. 1. Back pain  cyclobenzaprine (FLEXERIL) 5 MG tablet  2. Diabetes mellitus, type 1  insulin glargine (LANTUS SOLOSTAR) 100 UNIT/ML injection, Comprehensive metabolic panel, POCT glycosylated hemoglobin (Hb A1C)  3. Anxiety  doxepin (SINEQUAN) 25 MG capsule, clonazePAM (KLONOPIN) 1 MG tablet  4. Hypothyroid  levothyroxine (SYNTHROID, LEVOTHROID) 112 MCG tablet  5. Pancreatitis  lipase/protease/amylase (CREON-10/PANCREASE) 12000 UNITS CPEP  6. Vitamin B12 deficiency  cyanocobalamin ((VITAMIN B-12)) injection 1,000 mcg    -  Signed, [name], MD 09/09/2011 12:15 PM

## 2011-10-14 ENCOUNTER — Ambulatory Visit (INDEPENDENT_AMBULATORY_CARE_PROVIDER_SITE_OTHER): Payer: Medicare Other | Admitting: Physician Assistant

## 2011-10-14 DIAGNOSIS — E538 Deficiency of other specified B group vitamins: Secondary | ICD-10-CM

## 2011-10-14 DIAGNOSIS — D518 Other vitamin B12 deficiency anemias: Secondary | ICD-10-CM

## 2011-11-22 ENCOUNTER — Telehealth: Payer: Self-pay

## 2011-11-22 NOTE — Telephone Encounter (Signed)
Patient says Dr L set her up with an organization to help her pay for diabetes medication ([employer] [facility]??) but they have not sent her any insulin and she is out. Would like to know if Dr L can help her. She tried to call them repeatedly and they have been running her around.  Best # [phone] Her son's number as an alternate [phone]

## 2011-11-23 NOTE — Telephone Encounter (Signed)
insulin glargine (LANTUS SOLOSTAR) 100 UNIT/ML injection, please advise if there is anything we can do for this patient.

## 2011-11-24 NOTE — Telephone Encounter (Signed)
Please call [facility] to verify when she last picked up her Clonazepam.

## 2011-11-24 NOTE — Telephone Encounter (Signed)
I called patient back, to advise. She can not see the vial to draw up the meds, she did get her [facility]ar [facility], and is going to just go there from now on. Patient states [facility] has the klonopin Rx blocked. She states [facility] states they mailed to her, but she has not gotten it, she wants to know if we can send it to [facility], it looks like [facility], I am confused how [employer] got this, however patient wants to know if she can get [facility], so she can sleep, I advised her if we approve it she may have to pay out of pocket, she said this is fine, please advise. Amy

## 2011-11-24 NOTE — Telephone Encounter (Signed)
1. Offer to call the company for the patient and find out what the issue is delaying her medication. 2. Offer to switch her from the Solostar pen to Lantus vials (generic, I believe).

## 2011-11-25 NOTE — Telephone Encounter (Signed)
Thanks, I think this was the mail order and hopefully she has gotten it. Left message for patient to call me back.

## 2011-11-25 NOTE — Telephone Encounter (Signed)
[facility] reports [facility] p/up clonazepam from them on 10/12/11 #30, 1 tab Qhs., but that the mail order reports that she had been sent #90 from them previous to this. Pt denies she got any through mail order so [facility] as a one time fill but told her she would need our approval for them to fill this time. Can we pull a DEA report to check?

## 2011-11-25 NOTE — Telephone Encounter (Signed)
DEA database indicates that she received number 90 tabs on 09/15/11. Unfortunately I cannot authorize another refill based on this.

## 2011-12-08 NOTE — Progress Notes (Signed)
Pt presents for B12 injection only.

## 2011-12-09 ENCOUNTER — Encounter: Payer: Self-pay | Admitting: Family Medicine

## 2011-12-09 ENCOUNTER — Ambulatory Visit (INDEPENDENT_AMBULATORY_CARE_PROVIDER_SITE_OTHER): Payer: Medicare Other | Admitting: Family Medicine

## 2011-12-09 VITALS — BP 140/78 | HR 84 | Temp 98.0°F | Resp 16 | Ht 63.5 in | Wt 149.0 lb

## 2011-12-09 DIAGNOSIS — E039 Hypothyroidism, unspecified: Secondary | ICD-10-CM

## 2011-12-09 DIAGNOSIS — F419 Anxiety disorder, unspecified: Secondary | ICD-10-CM

## 2011-12-09 DIAGNOSIS — E1165 Type 2 diabetes mellitus with hyperglycemia: Secondary | ICD-10-CM

## 2011-12-09 DIAGNOSIS — R21 Rash and other nonspecific skin eruption: Secondary | ICD-10-CM

## 2011-12-09 DIAGNOSIS — L219 Seborrheic dermatitis, unspecified: Secondary | ICD-10-CM

## 2011-12-09 DIAGNOSIS — IMO0001 Reserved for inherently not codable concepts without codable children: Secondary | ICD-10-CM

## 2011-12-09 DIAGNOSIS — F411 Generalized anxiety disorder: Secondary | ICD-10-CM

## 2011-12-09 DIAGNOSIS — R110 Nausea: Secondary | ICD-10-CM

## 2011-12-09 DIAGNOSIS — Z23 Encounter for immunization: Secondary | ICD-10-CM

## 2011-12-09 DIAGNOSIS — E538 Deficiency of other specified B group vitamins: Secondary | ICD-10-CM

## 2011-12-09 DIAGNOSIS — E11319 Type 2 diabetes mellitus with unspecified diabetic retinopathy without macular edema: Secondary | ICD-10-CM

## 2011-12-09 LAB — GLUCOSE, POCT (MANUAL RESULT ENTRY): POC Glucose: 244 mg/dl — AB (ref 70–99)

## 2011-12-09 LAB — POCT GLYCOSYLATED HEMOGLOBIN (HGB A1C): Hemoglobin A1C: 10.8

## 2011-12-09 MED ORDER — ALPRAZOLAM 0.25 MG PO TABS: 0.2500 mg | ORAL_TABLET | Freq: Two times a day (BID) | ORAL | Status: DC | PRN

## 2011-12-09 MED ORDER — TRIAMCINOLONE ACETONIDE 0.1 % EX CREA: TOPICAL_CREAM | Freq: Two times a day (BID) | CUTANEOUS | Status: DC

## 2011-12-09 MED ORDER — INSULIN PEN NEEDLE 31G X 8 MM MISC: 100.0000 "application " | Freq: Every morning | Status: DC

## 2011-12-09 MED ORDER — CLONAZEPAM 1 MG PO TABS
1.0000 mg | ORAL_TABLET | Freq: Every day | ORAL | Status: DC
Start: 2011-12-09 — End: 2012-01-12

## 2011-12-09 MED ORDER — LEVOTHYROXINE SODIUM 112 MCG PO TABS: 112.0000 ug | ORAL_TABLET | Freq: Every day | ORAL | Status: DC

## 2011-12-09 MED ORDER — INSULIN GLARGINE 100 UNIT/ML ~~LOC~~ SOLN: 35.0000 [IU] | Freq: Every day | SUBCUTANEOUS | Status: DC

## 2011-12-09 MED ORDER — ONDANSETRON 4 MG PO TBDP: 4.0000 mg | ORAL_TABLET | Freq: Three times a day (TID) | ORAL | Status: DC | PRN

## 2011-12-09 MED ORDER — CYANOCOBALAMIN 1000 MCG/ML IJ SOLN
1000.0000 ug | Freq: Once | INTRAMUSCULAR | Status: AC
  Administered 2011-12-09: 1000 ug via INTRAMUSCULAR

## 2011-12-09 MED ORDER — BETAMETHASONE DIPROPIONATE 0.05 % EX LOTN: TOPICAL_LOTION | Freq: Two times a day (BID) | CUTANEOUS | Status: DC

## 2011-12-09 NOTE — Progress Notes (Signed)
78 yo woman with diabetes and she smokes.  She is having retinal procedure with Dr. Jerolyn Center tomorrow.  Her vision is blurry in both eyes.  She is also having difficulty receiving her medicine from the mail order pharmacy.  Objective:  Anxious Fundi:  No active hemorrhages identified.  I have difficulty viewing retina because of small pupils Neck:  Supple, no bruit Chest:  Clear Heart:  Reg, I/VI sem, no gallop Skin:  Excoriated areas on back  Results for orders placed in visit on 12/09/11  GLUCOSE, POCT (MANUAL RESULT ENTRY)      Component Value Range   POC Glucose 244 (*) 70 - 99 mg/dl  POCT GLYCOSYLATED HEMOGLOBIN (HGB A1C)      Component Value Range   Hemoglobin A1C 10.8     Assessment:  Poorly controlled diabetes.  I am very worried about her, especially since she continues to smoke.  Plan:  Eye appt tomorrow Increase Lantus to 42 units daily Prescribe glucometer for tid home use Recheck in 2 weeks: consider hospitalization for control of sugar 1. Diabetes type 2, uncontrolled  insulin glargine (LANTUS SOLOSTAR) 100 UNIT/ML injection, Insulin Pen Needle (B-D ULTRAFINE III SHORT PEN) 31G X 8 MM MISC, POCT glucose (manual entry), POCT glycosylated hemoglobin (Hb A1C), Comprehensive metabolic panel  2. Anxiety  clonazePAM (KLONOPIN) 1 MG tablet, ALPRAZolam (XANAX) 0.25 MG tablet  3. Seborrhea  betamethasone dipropionate 0.05 % lotion  4. Hypothyroid  levothyroxine (SYNTHROID, LEVOTHROID) 112 MCG tablet  5. Skin rash  triamcinolone cream (KENALOG) 0.1 %  6. Nausea  ondansetron (ZOFRAN ODT) 4 MG disintegrating tablet  7. B12 deficiency  cyanocobalamin ((VITAMIN B-12)) injection 1,000 mcg  8. Need for prophylactic vaccination and inoculation against influenza  Flu vaccine greater than or equal to 3yo preservative free IM   I spent 35 minutes face to face with patient.

## 2011-12-10 ENCOUNTER — Encounter (INDEPENDENT_AMBULATORY_CARE_PROVIDER_SITE_OTHER): Payer: Medicare Other | Admitting: Ophthalmology

## 2011-12-10 DIAGNOSIS — E11319 Type 2 diabetes mellitus with unspecified diabetic retinopathy without macular edema: Secondary | ICD-10-CM

## 2011-12-10 DIAGNOSIS — E11359 Type 2 diabetes mellitus with proliferative diabetic retinopathy without macular edema: Secondary | ICD-10-CM

## 2011-12-10 DIAGNOSIS — H43819 Vitreous degeneration, unspecified eye: Secondary | ICD-10-CM

## 2011-12-10 DIAGNOSIS — E1139 Type 2 diabetes mellitus with other diabetic ophthalmic complication: Secondary | ICD-10-CM

## 2011-12-10 DIAGNOSIS — E1165 Type 2 diabetes mellitus with hyperglycemia: Secondary | ICD-10-CM

## 2011-12-10 LAB — COMPREHENSIVE METABOLIC PANEL
ALT: 16 U/L (ref 0–35)
AST: 21 U/L (ref 0–37)
Albumin: 3.9 g/dL (ref 3.5–5.2)
Alkaline Phosphatase: 88 U/L (ref 39–117)
BUN: 13 mg/dL (ref 6–23)
CO2: 27 mEq/L (ref 19–32)
Calcium: 9.3 mg/dL (ref 8.4–10.5)
Chloride: 100 mEq/L (ref 96–112)
Creat: 0.63 mg/dL (ref 0.50–1.10)
Glucose, Bld: 218 mg/dL — ABNORMAL HIGH (ref 70–99)
Potassium: 4.4 mEq/L (ref 3.5–5.3)
Sodium: 135 mEq/L (ref 135–145)
Total Bilirubin: 0.5 mg/dL (ref 0.3–1.2)
Total Protein: 6.8 g/dL (ref 6.0–8.3)

## 2011-12-15 ENCOUNTER — Ambulatory Visit (INDEPENDENT_AMBULATORY_CARE_PROVIDER_SITE_OTHER): Payer: Medicare Other | Admitting: Ophthalmology

## 2011-12-15 DIAGNOSIS — H43819 Vitreous degeneration, unspecified eye: Secondary | ICD-10-CM

## 2011-12-15 DIAGNOSIS — E11359 Type 2 diabetes mellitus with proliferative diabetic retinopathy without macular edema: Secondary | ICD-10-CM

## 2011-12-15 DIAGNOSIS — E11319 Type 2 diabetes mellitus with unspecified diabetic retinopathy without macular edema: Secondary | ICD-10-CM

## 2011-12-15 DIAGNOSIS — E1139 Type 2 diabetes mellitus with other diabetic ophthalmic complication: Secondary | ICD-10-CM

## 2011-12-24 IMAGING — US US ABDOMEN COMPLETE
1 series · 14 of 25 positions shown · non-contrast
Comparison: None.

CLINICAL DATA: The nausea, vomiting, and diarrhea with abdominal
pain.

COMPLETE ABDOMINAL ULTRASOUND

[Series 1: us abdomen complete · 0.30mm/px · 14 of 85 slices shown]
[im 1/85]
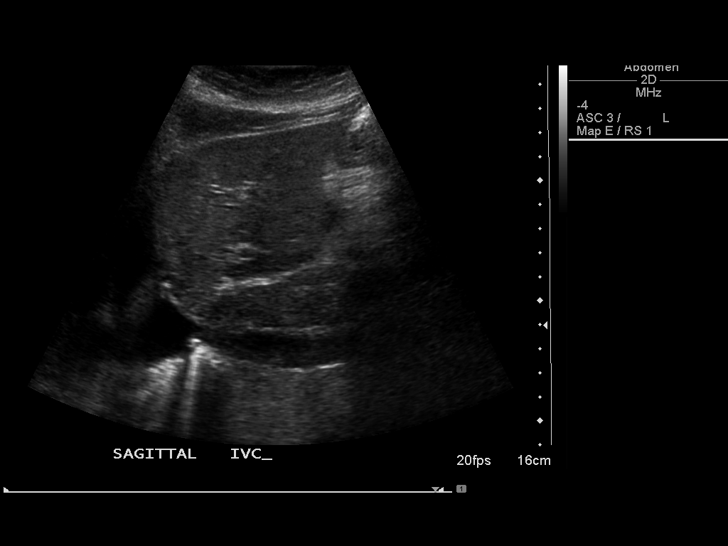
[im 8/85]
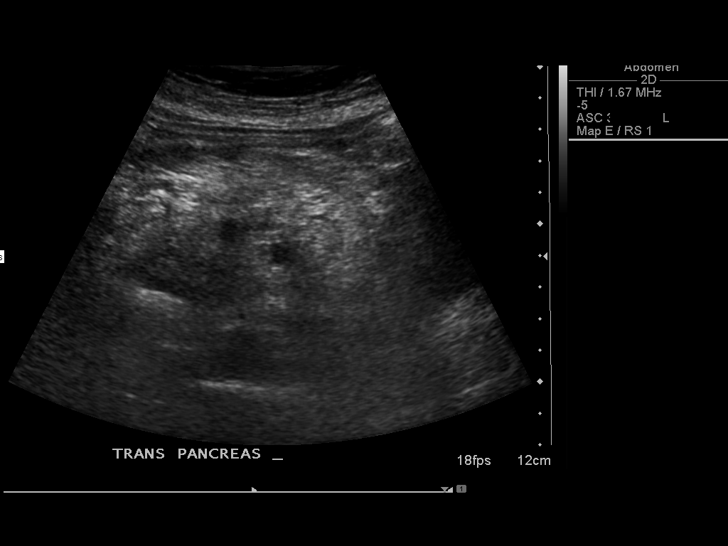
[im 15/85]
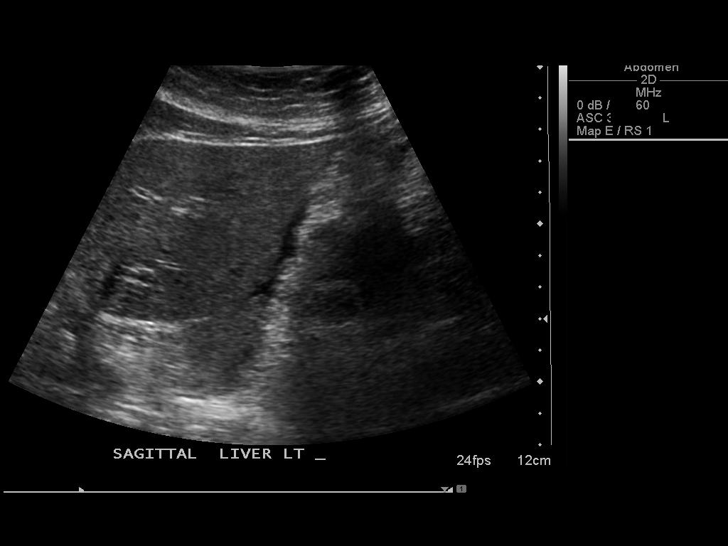
[im 22/85]
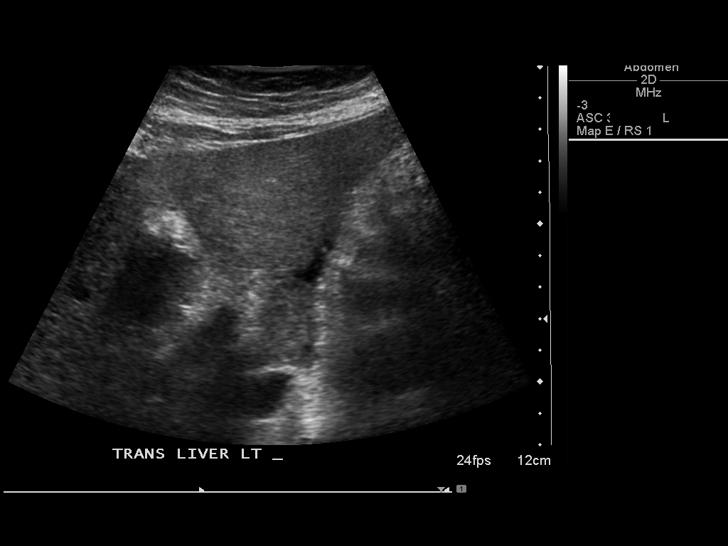
[im 29/85]
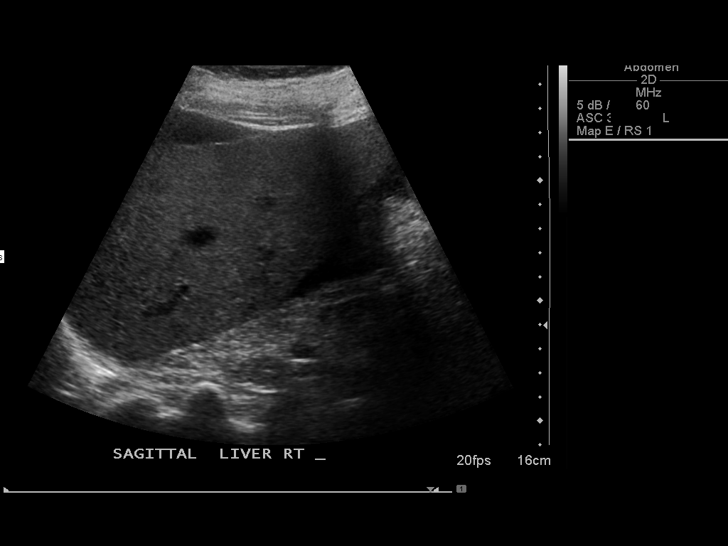
[im 32/85]
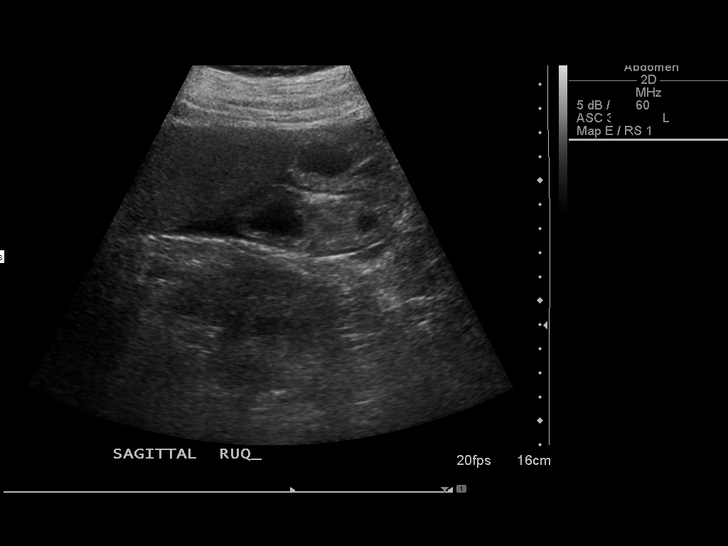
[im 39/85]
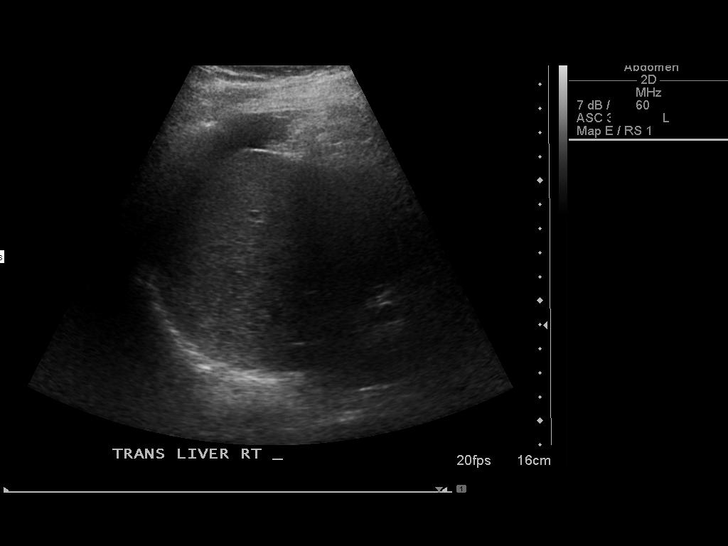
[im 46/85]
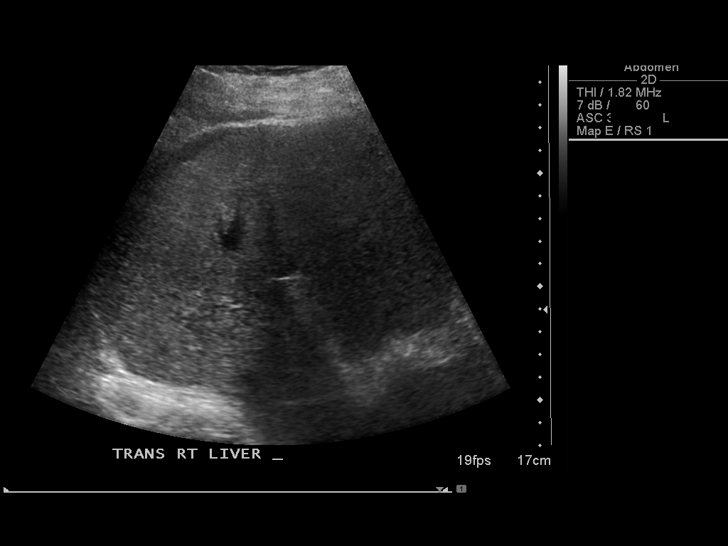
[im 53/85]
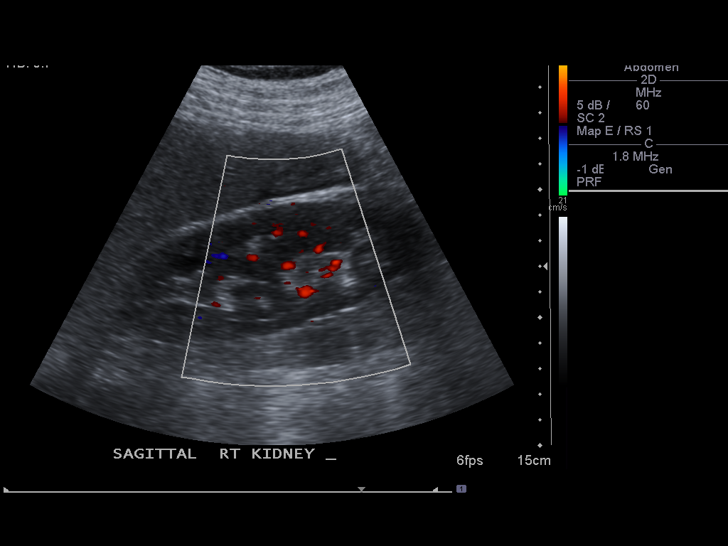
[im 57/85]
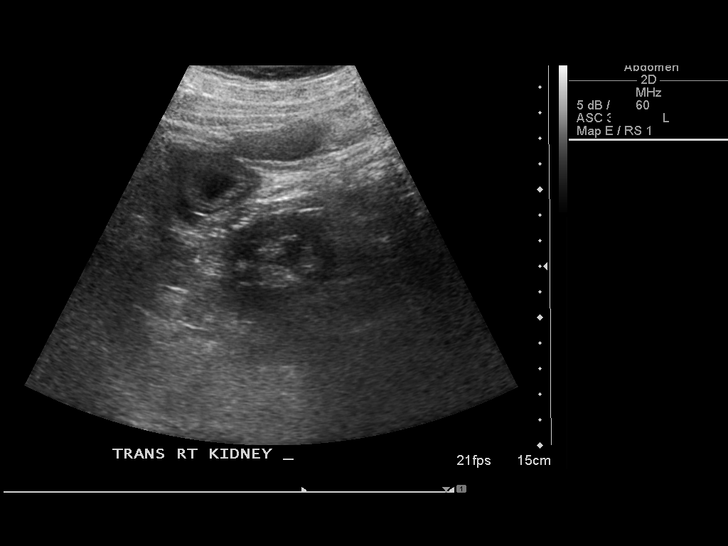
[im 64/85]
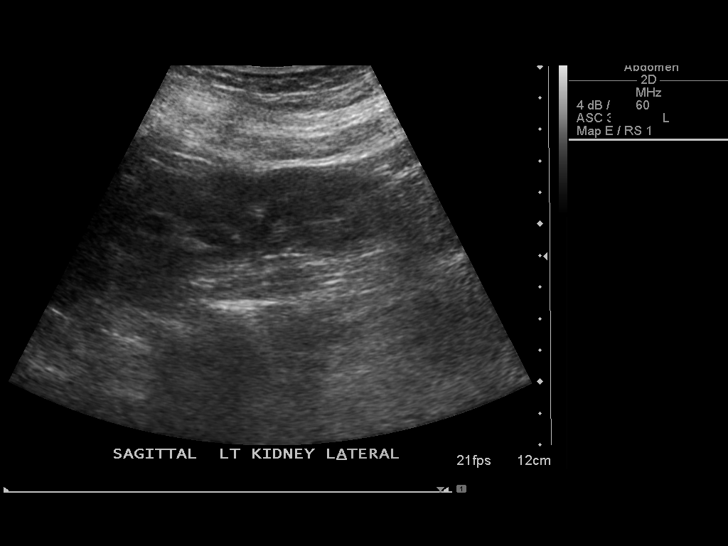
[im 71/85]
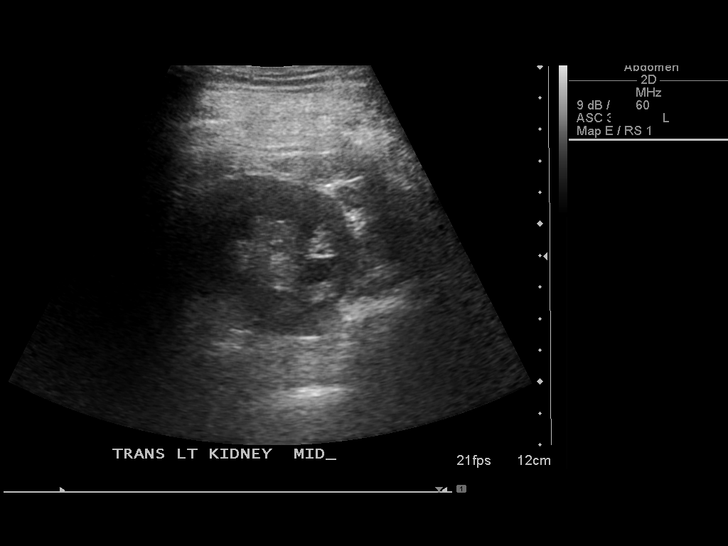
[im 78/85]
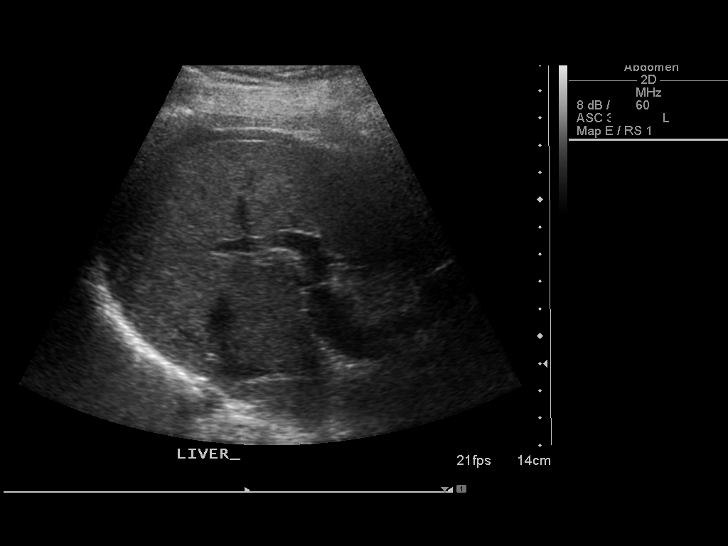
[im 85/85]
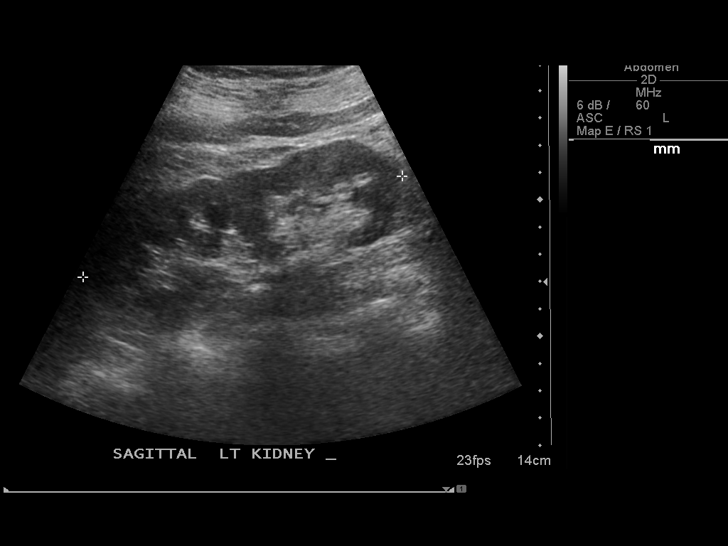

[14 of 25 positions shown; findings below may reference images not displayed]

FINDINGS: Gallbladder:  Surgically removed

Common bile duct:  7.8 mm.  No obvious stones.  This is within
normal limits post cholecystectomy.

Liver:  Diffuse increased echogenicity without focal lesions.  This
pattern is most commonly seen with steatosis.  However, other
etiologies for chronic hepato- cellular disease can have a similar
sonographic appearance.

IVC:  Normal

Pancreas:  Visualization is limited.  Portions of the pancreas that
are visualized show no obvious abnormality.

Spleen:  Normal

Right Kidney:  12.9 cm.  No hydronephrosis.

Left Kidney:  The 12.3 cm.  Question mild caliectasis but no frank
hydronephrosis or other pathology.

Abdominal aorta:  No aneurysm identified.

Other findings:  A small amount of ascites is noted.
IMPRESSION: 1.  Post cholecystectomy.
2.  Probable hepatic steatosis.
3.  A small amount of ascites is noted.
4.  No other acute or specific findings.

## 2011-12-30 ENCOUNTER — Ambulatory Visit (INDEPENDENT_AMBULATORY_CARE_PROVIDER_SITE_OTHER): Payer: Medicare Other | Admitting: Family Medicine

## 2011-12-30 ENCOUNTER — Encounter: Payer: Self-pay | Admitting: Family Medicine

## 2011-12-30 VITALS — BP 136/70 | HR 86 | Temp 97.9°F | Resp 16 | Ht 63.5 in | Wt 149.8 lb

## 2011-12-30 DIAGNOSIS — E11319 Type 2 diabetes mellitus with unspecified diabetic retinopathy without macular edema: Secondary | ICD-10-CM

## 2011-12-30 DIAGNOSIS — E109 Type 1 diabetes mellitus without complications: Secondary | ICD-10-CM

## 2011-12-30 NOTE — Progress Notes (Signed)
78 yo insulin dependent diabetic who has improved some over the past month:  Less nausea  Still not sleeping real well  Vision is better.  She had left eye laser treatment.  Will return for more treatments Dec 9th.  Still can't read or write.  Seeing Dr. Charlsie Merles for her toes.  She has to pay for a taxi to and from all her appointments.  This is economically taxing.  Objective:  NAD  Chest:  Clear Heart:  II/VI SEM, regular Extrem:  No edema, good sensation  Assessment: type 1 diabetes, not well controlled  Plan:  Lantus 42 units daily New glucometer 1. Type 1 diabetes  Comprehensive metabolic panel   I tried to encourage patient to stop smoking. Follow up 2 months.

## 2011-12-31 LAB — COMPREHENSIVE METABOLIC PANEL
ALT: 15 U/L (ref 0–35)
AST: 15 U/L (ref 0–37)
Albumin: 3.8 g/dL (ref 3.5–5.2)
Alkaline Phosphatase: 83 U/L (ref 39–117)
BUN: 17 mg/dL (ref 6–23)
CO2: 27 mEq/L (ref 19–32)
Calcium: 9.3 mg/dL (ref 8.4–10.5)
Chloride: 101 mEq/L (ref 96–112)
Creat: 0.69 mg/dL (ref 0.50–1.10)
Glucose, Bld: 146 mg/dL — ABNORMAL HIGH (ref 70–99)
Potassium: 4.5 mEq/L (ref 3.5–5.3)
Sodium: 136 mEq/L (ref 135–145)
Total Bilirubin: 0.5 mg/dL (ref 0.3–1.2)
Total Protein: 6.7 g/dL (ref 6.0–8.3)

## 2012-01-12 ENCOUNTER — Other Ambulatory Visit: Payer: Self-pay | Admitting: Family Medicine

## 2012-01-13 ENCOUNTER — Telehealth: Payer: Self-pay

## 2012-01-13 NOTE — Telephone Encounter (Signed)
She was given with 5 refills on Oct 31, I have advised her. I have called pharmacy also.

## 2012-01-13 NOTE — Telephone Encounter (Signed)
Patient is out of refills of her clonazePAM (KLONOPIN) 1 MG tablet [facility]

## 2012-01-17 ENCOUNTER — Encounter (INDEPENDENT_AMBULATORY_CARE_PROVIDER_SITE_OTHER): Payer: Medicare Other | Admitting: Ophthalmology

## 2012-01-17 DIAGNOSIS — H353290 Exudative age-related macular degeneration, unspecified eye, stage unspecified: Secondary | ICD-10-CM

## 2012-01-17 DIAGNOSIS — E11359 Type 2 diabetes mellitus with proliferative diabetic retinopathy without macular edema: Secondary | ICD-10-CM

## 2012-01-17 DIAGNOSIS — E1165 Type 2 diabetes mellitus with hyperglycemia: Secondary | ICD-10-CM

## 2012-01-17 DIAGNOSIS — H43819 Vitreous degeneration, unspecified eye: Secondary | ICD-10-CM

## 2012-01-17 DIAGNOSIS — H3530 Unspecified macular degeneration: Secondary | ICD-10-CM

## 2012-01-21 ENCOUNTER — Encounter (INDEPENDENT_AMBULATORY_CARE_PROVIDER_SITE_OTHER): Payer: Medicare Other | Admitting: Ophthalmology

## 2012-01-21 DIAGNOSIS — H43819 Vitreous degeneration, unspecified eye: Secondary | ICD-10-CM

## 2012-01-21 DIAGNOSIS — E1139 Type 2 diabetes mellitus with other diabetic ophthalmic complication: Secondary | ICD-10-CM

## 2012-01-21 DIAGNOSIS — E11359 Type 2 diabetes mellitus with proliferative diabetic retinopathy without macular edema: Secondary | ICD-10-CM

## 2012-01-21 DIAGNOSIS — H353290 Exudative age-related macular degeneration, unspecified eye, stage unspecified: Secondary | ICD-10-CM

## 2012-01-21 DIAGNOSIS — I10 Essential (primary) hypertension: Secondary | ICD-10-CM

## 2012-01-21 DIAGNOSIS — H35359 Cystoid macular degeneration, unspecified eye: Secondary | ICD-10-CM

## 2012-01-21 DIAGNOSIS — H35039 Hypertensive retinopathy, unspecified eye: Secondary | ICD-10-CM

## 2012-02-18 ENCOUNTER — Encounter (INDEPENDENT_AMBULATORY_CARE_PROVIDER_SITE_OTHER): Payer: Medicare Other | Admitting: Ophthalmology

## 2012-02-18 DIAGNOSIS — E11359 Type 2 diabetes mellitus with proliferative diabetic retinopathy without macular edema: Secondary | ICD-10-CM

## 2012-02-18 DIAGNOSIS — H43819 Vitreous degeneration, unspecified eye: Secondary | ICD-10-CM

## 2012-02-18 DIAGNOSIS — I10 Essential (primary) hypertension: Secondary | ICD-10-CM

## 2012-02-18 DIAGNOSIS — H353290 Exudative age-related macular degeneration, unspecified eye, stage unspecified: Secondary | ICD-10-CM

## 2012-02-18 DIAGNOSIS — H2510 Age-related nuclear cataract, unspecified eye: Secondary | ICD-10-CM

## 2012-02-18 DIAGNOSIS — H3530 Unspecified macular degeneration: Secondary | ICD-10-CM

## 2012-02-18 DIAGNOSIS — H35039 Hypertensive retinopathy, unspecified eye: Secondary | ICD-10-CM

## 2012-02-18 DIAGNOSIS — E1139 Type 2 diabetes mellitus with other diabetic ophthalmic complication: Secondary | ICD-10-CM

## 2012-02-19 ENCOUNTER — Other Ambulatory Visit: Payer: Self-pay | Admitting: Family Medicine

## 2012-03-01 ENCOUNTER — Other Ambulatory Visit (INDEPENDENT_AMBULATORY_CARE_PROVIDER_SITE_OTHER): Payer: Medicare Other | Admitting: Ophthalmology

## 2012-03-01 DIAGNOSIS — E11359 Type 2 diabetes mellitus with proliferative diabetic retinopathy without macular edema: Secondary | ICD-10-CM

## 2012-03-01 DIAGNOSIS — E1165 Type 2 diabetes mellitus with hyperglycemia: Secondary | ICD-10-CM

## 2012-03-02 ENCOUNTER — Encounter: Payer: Self-pay | Admitting: Family Medicine

## 2012-03-02 ENCOUNTER — Ambulatory Visit (INDEPENDENT_AMBULATORY_CARE_PROVIDER_SITE_OTHER): Payer: Medicare Other | Admitting: Family Medicine

## 2012-03-02 VITALS — BP 158/66 | HR 66 | Temp 98.0°F | Resp 16 | Ht 63.0 in | Wt 150.0 lb

## 2012-03-02 DIAGNOSIS — IMO0001 Reserved for inherently not codable concepts without codable children: Secondary | ICD-10-CM

## 2012-03-02 DIAGNOSIS — F411 Generalized anxiety disorder: Secondary | ICD-10-CM

## 2012-03-02 DIAGNOSIS — E119 Type 2 diabetes mellitus without complications: Secondary | ICD-10-CM

## 2012-03-02 DIAGNOSIS — E538 Deficiency of other specified B group vitamins: Secondary | ICD-10-CM

## 2012-03-02 DIAGNOSIS — K859 Acute pancreatitis without necrosis or infection, unspecified: Secondary | ICD-10-CM

## 2012-03-02 DIAGNOSIS — E039 Hypothyroidism, unspecified: Secondary | ICD-10-CM

## 2012-03-02 DIAGNOSIS — M549 Dorsalgia, unspecified: Secondary | ICD-10-CM

## 2012-03-02 DIAGNOSIS — F419 Anxiety disorder, unspecified: Secondary | ICD-10-CM

## 2012-03-02 DIAGNOSIS — H3500 Unspecified background retinopathy: Secondary | ICD-10-CM

## 2012-03-02 DIAGNOSIS — Z23 Encounter for immunization: Secondary | ICD-10-CM

## 2012-03-02 DIAGNOSIS — L219 Seborrheic dermatitis, unspecified: Secondary | ICD-10-CM

## 2012-03-02 LAB — POCT GLYCOSYLATED HEMOGLOBIN (HGB A1C): Hemoglobin A1C: 8.6

## 2012-03-02 MED ORDER — TRAMADOL HCL 50 MG PO TABS: 50.0000 mg | ORAL_TABLET | Freq: Four times a day (QID) | ORAL | Status: AC | PRN

## 2012-03-02 MED ORDER — PANCRELIPASE (LIP-PROT-AMYL) 12000-38000 UNITS PO CPEP: 1.0000 | ORAL_CAPSULE | Freq: Three times a day (TID) | ORAL | Status: DC

## 2012-03-02 MED ORDER — PNEUMOCOCCAL VAC POLYVALENT 25 MCG/0.5ML IJ INJ: 0.5000 mL | INJECTION | INTRAMUSCULAR | Status: DC

## 2012-03-02 MED ORDER — BETAMETHASONE DIPROPIONATE 0.05 % EX LOTN: TOPICAL_LOTION | Freq: Two times a day (BID) | CUTANEOUS | Status: DC

## 2012-03-02 MED ORDER — PNEUMOCOCCAL VAC POLYVALENT 25 MCG/0.5ML IJ INJ
0.5000 mL | INJECTION | Freq: Once | INTRAMUSCULAR | Status: AC
  Administered 2012-03-02: 0.5 mL via INTRAMUSCULAR

## 2012-03-02 MED ORDER — CYANOCOBALAMIN 1000 MCG/ML IJ SOLN
1000.0000 ug | Freq: Once | INTRAMUSCULAR | Status: AC
  Administered 2012-03-02: 1000 ug via INTRAMUSCULAR

## 2012-03-02 MED ORDER — DOXEPIN HCL 25 MG PO CAPS: 25.0000 mg | ORAL_CAPSULE | Freq: Every day | ORAL | Status: DC

## 2012-03-02 MED ORDER — LEVOTHYROXINE SODIUM 112 MCG PO TABS: 112.0000 ug | ORAL_TABLET | Freq: Every day | ORAL | Status: DC

## 2012-03-02 NOTE — Progress Notes (Signed)
79 yo retired woman with multiple problems.   Yesterday underwent laser surgery to right eye.  Has follow up Feb 4th.  By the 12th she should be done Cannot read her numbers on the glucometer anymore. Just picked up new insulin Cannot recall last microalbumin Brought her today by niece  Objective:  Alert, good eye contact (bandage removed) Right sclera is injected. Chest:  Clear Heart:  Reg, no murmur Ext:  No edema, normal sensation and DP pulses Abdomen:  Soft, nontender without masses.  Assessment:  Stable,   Plan:

## 2012-03-03 LAB — MICROALBUMIN, URINE: Microalb, Ur: 19.84 mg/dL — ABNORMAL HIGH (ref 0.00–1.89)

## 2012-03-14 ENCOUNTER — Encounter (INDEPENDENT_AMBULATORY_CARE_PROVIDER_SITE_OTHER): Payer: Medicare Other | Admitting: Ophthalmology

## 2012-03-14 DIAGNOSIS — E11359 Type 2 diabetes mellitus with proliferative diabetic retinopathy without macular edema: Secondary | ICD-10-CM

## 2012-03-14 DIAGNOSIS — E1139 Type 2 diabetes mellitus with other diabetic ophthalmic complication: Secondary | ICD-10-CM

## 2012-03-14 DIAGNOSIS — H353290 Exudative age-related macular degeneration, unspecified eye, stage unspecified: Secondary | ICD-10-CM

## 2012-03-14 DIAGNOSIS — I10 Essential (primary) hypertension: Secondary | ICD-10-CM

## 2012-03-14 DIAGNOSIS — H35039 Hypertensive retinopathy, unspecified eye: Secondary | ICD-10-CM

## 2012-03-14 DIAGNOSIS — H43819 Vitreous degeneration, unspecified eye: Secondary | ICD-10-CM

## 2012-03-14 DIAGNOSIS — E11319 Type 2 diabetes mellitus with unspecified diabetic retinopathy without macular edema: Secondary | ICD-10-CM

## 2012-03-22 ENCOUNTER — Encounter (INDEPENDENT_AMBULATORY_CARE_PROVIDER_SITE_OTHER): Payer: Medicare Other | Admitting: Ophthalmology

## 2012-03-28 ENCOUNTER — Encounter (HOSPITAL_COMMUNITY): Payer: Self-pay

## 2012-03-28 ENCOUNTER — Emergency Department (HOSPITAL_COMMUNITY): Payer: Medicare Other

## 2012-03-28 ENCOUNTER — Emergency Department (HOSPITAL_COMMUNITY)
Admission: EM | Admit: 2012-03-28 | Discharge: 2012-03-28 | Disposition: A | Discharge status: Home / Self Care | Payer: Medicare Other | Attending: Emergency Medicine | Admitting: Emergency Medicine

## 2012-03-28 DIAGNOSIS — E119 Type 2 diabetes mellitus without complications: Secondary | ICD-10-CM | POA: Insufficient documentation

## 2012-03-28 DIAGNOSIS — Z79899 Other long term (current) drug therapy: Secondary | ICD-10-CM | POA: Insufficient documentation

## 2012-03-28 DIAGNOSIS — R05 Cough: Secondary | ICD-10-CM | POA: Insufficient documentation

## 2012-03-28 DIAGNOSIS — J449 Chronic obstructive pulmonary disease, unspecified: Secondary | ICD-10-CM | POA: Insufficient documentation

## 2012-03-28 DIAGNOSIS — J4489 Other specified chronic obstructive pulmonary disease: Secondary | ICD-10-CM | POA: Insufficient documentation

## 2012-03-28 DIAGNOSIS — F17200 Nicotine dependence, unspecified, uncomplicated: Secondary | ICD-10-CM | POA: Insufficient documentation

## 2012-03-28 DIAGNOSIS — R509 Fever, unspecified: Secondary | ICD-10-CM | POA: Insufficient documentation

## 2012-03-28 DIAGNOSIS — J159 Unspecified bacterial pneumonia: Secondary | ICD-10-CM | POA: Insufficient documentation

## 2012-03-28 DIAGNOSIS — J189 Pneumonia, unspecified organism: Secondary | ICD-10-CM

## 2012-03-28 DIAGNOSIS — I10 Essential (primary) hypertension: Secondary | ICD-10-CM | POA: Insufficient documentation

## 2012-03-28 DIAGNOSIS — Z794 Long term (current) use of insulin: Secondary | ICD-10-CM | POA: Insufficient documentation

## 2012-03-28 DIAGNOSIS — E079 Disorder of thyroid, unspecified: Secondary | ICD-10-CM | POA: Insufficient documentation

## 2012-03-28 DIAGNOSIS — Z7982 Long term (current) use of aspirin: Secondary | ICD-10-CM | POA: Insufficient documentation

## 2012-03-28 DIAGNOSIS — R059 Cough, unspecified: Secondary | ICD-10-CM | POA: Insufficient documentation

## 2012-03-28 DIAGNOSIS — Z8719 Personal history of other diseases of the digestive system: Secondary | ICD-10-CM | POA: Insufficient documentation

## 2012-03-28 LAB — CBC WITH DIFFERENTIAL/PLATELET
Eosinophils Relative: 1 % (ref 0–5)
HCT: 45.7 % (ref 36.0–46.0)
Lymphocytes Relative: 24 % (ref 12–46)
Lymphs Abs: 2.1 10*3/uL (ref 0.7–4.0)
MCV: 85.6 fL (ref 78.0–100.0)
Monocytes Absolute: 0.6 10*3/uL (ref 0.1–1.0)
Neutro Abs: 6.1 10*3/uL (ref 1.7–7.7)
Platelets: 263 10*3/uL (ref 150–400)
RBC: 5.34 MIL/uL — ABNORMAL HIGH (ref 3.87–5.11)
WBC: 8.9 10*3/uL (ref 4.0–10.5)

## 2012-03-28 LAB — BASIC METABOLIC PANEL
CO2: 29 mEq/L (ref 19–32)
Calcium: 9.2 mg/dL (ref 8.4–10.5)
Chloride: 99 mEq/L (ref 96–112)
Glucose, Bld: 104 mg/dL — ABNORMAL HIGH (ref 70–99)
Sodium: 138 mEq/L (ref 135–145)

## 2012-03-28 LAB — D-DIMER, QUANTITATIVE: D-Dimer, Quant: 1.07 ug/mL-FEU — ABNORMAL HIGH (ref 0.00–0.48)

## 2012-03-28 LAB — GLUCOSE, CAPILLARY: Glucose-Capillary: 204 mg/dL — ABNORMAL HIGH (ref 70–99)

## 2012-03-28 MED ORDER — PREDNISONE 20 MG PO TABS
60.0000 mg | ORAL_TABLET | Freq: Once | ORAL | Status: AC
  Administered 2012-03-28: 60 mg via ORAL
  Filled 2012-03-28: qty 3

## 2012-03-28 MED ORDER — AZITHROMYCIN 250 MG PO TABS
500.0000 mg | ORAL_TABLET | Freq: Once | ORAL | Status: AC
  Administered 2012-03-28: 500 mg via ORAL
  Filled 2012-03-28: qty 2

## 2012-03-28 MED ORDER — ALBUTEROL SULFATE (5 MG/ML) 0.5% IN NEBU
5.0000 mg | INHALATION_SOLUTION | Freq: Once | RESPIRATORY_TRACT | Status: AC
  Administered 2012-03-28: 5 mg via RESPIRATORY_TRACT
  Filled 2012-03-28: qty 1

## 2012-03-28 MED ORDER — LEVOFLOXACIN 750 MG PO TABS: 750.0000 mg | ORAL_TABLET | Freq: Every day | ORAL | Status: AC

## 2012-03-28 MED ORDER — IPRATROPIUM BROMIDE 0.02 % IN SOLN
0.5000 mg | Freq: Once | RESPIRATORY_TRACT | Status: AC
  Administered 2012-03-28: 0.5 mg via RESPIRATORY_TRACT
  Filled 2012-03-28: qty 2.5

## 2012-03-28 MED ORDER — LEVOFLOXACIN 750 MG PO TABS: 750.0000 mg | ORAL_TABLET | Freq: Every day | ORAL | Status: DC

## 2012-03-28 MED ORDER — DEXTROSE 5 % IV SOLN
1.0000 g | Freq: Once | INTRAVENOUS | Status: AC
  Administered 2012-03-28: 1 g via INTRAVENOUS
  Filled 2012-03-28: qty 10

## 2012-03-28 NOTE — ED Notes (Signed)
Pt,. Ate 100% of lunch.  Pt. Did ambulate in the hallway, no dyspnea noted.   Pt. Feeling much better after breathing treatment.  Pt. Has had no coughing since arrival,

## 2012-03-28 NOTE — ED Provider Notes (Signed)
History     CSN: [id number]  Arrival date & time 03/28/12  0804   First MD Initiated Contact with Patient 03/28/12 [phone]      Chief Complaint  Patient presents with  . Chest Pain    HPI Misty Ball is a 78 y.o. female who presented to the ED because of coughing, CP, and increase in mucous production.  Patient reports that this started two weeks ago.  Patient with history of COPD and recurrent bouts of bronchitis.  Started coughing and having subjective fevers.  Then over last few days will have sharp L sided CP.  Only with coughing.  None while at rest.  Able to reproduce the pain when she touches the area.  No history of PE.  No history of MI.    Past Medical History  Diagnosis Date  . Hypertension   . Pancreatitis   . Diabetes mellitus   . Thyroid disease     Past Surgical History  Procedure Laterality Date  . Abdominal hysterectomy    . Cholecystectomy    . Appendectomy     Family History: Reviewed.  None pertinent.    History  Substance Use Topics  . Smoking status: Current Every Day Smoker -- 0.50 packs/day for 52 years    Types: Cigarettes  . Smokeless tobacco: Not on file  . Alcohol Use: No    Review of Systems  Constitutional: Negative for fever and chills.  HENT: Negative for congestion, rhinorrhea, neck pain and neck stiffness.   Respiratory: Positive for cough. Negative for shortness of breath.   Cardiovascular: Positive for chest pain.  Gastrointestinal: Negative for nausea, vomiting, abdominal pain, diarrhea and abdominal distention.  Endocrine: Negative for polyuria.  Genitourinary: Negative for dysuria.  Skin: Negative for rash.  Neurological: Negative for headaches.  Psychiatric/Behavioral: Negative.   All other systems reviewed and are negative.    Allergies  Sulfonamide derivatives  Home Medications   Current Outpatient Rx  Name  Route  Sig  Dispense  Refill  . aspirin EC 81 MG tablet   Oral   Take 81 mg by mouth daily.          . betamethasone dipropionate 0.05 % lotion   Topical   Apply 1 application topically 2 (two) times daily.         . clonazePAM (KLONOPIN) 0.5 MG tablet   Oral   Take 0.5 mg by mouth at bedtime.         . cyclobenzaprine (FLEXERIL) 5 MG tablet   Oral   Take 5 mg by mouth at bedtime as needed for muscle spasms.         [name] doxepin (SINEQUAN) 25 MG capsule   Oral   Take 25 mg by mouth at bedtime.         . insulin glargine (LANTUS) 100 UNIT/ML injection   Subcutaneous   Inject 42 Units into the skin daily.         [name] levothyroxine (SYNTHROID, LEVOTHROID) 112 MCG tablet   Oral   Take 112 mcg by mouth daily.         . lipase/protease/amylase (CREON-10/PANCREASE) 12000 UNITS CPEP   Oral   Take 1 capsule by mouth 3 (three) times daily before meals.         . triamcinolone cream (KENALOG) 0.1 %   Topical   Apply 1 application topically 2 (two) times daily.         . Insulin Pen Needle (B-D ULTRAFINE III  SHORT PEN) 31G X 8 MM MISC   Intradermal   Inject 100 application into the skin every morning.   100 each   3   . levofloxacin (LEVAQUIN) 750 MG tablet   Oral   Take 1 tablet (750 mg total) by mouth daily.   5 tablet   0     BP 137/84  Pulse 85  Temp(Src) 98.3 F (36.8 C) (Oral)  Resp 20  SpO2 96%  Physical Exam  Nursing note and vitals reviewed. Constitutional: She is oriented to person, place, and time. She appears well-developed and well-nourished. No distress.  HENT:  Head: Normocephalic and atraumatic.  Right Ear: External ear normal.  Left Ear: External ear normal.  Nose: Nose normal.  Mouth/Throat: Oropharynx is clear and moist. No oropharyngeal exudate.  Eyes: EOM are normal. Pupils are equal, round, and reactive to light.  Neck: Normal range of motion. Neck supple. No tracheal deviation present.  Cardiovascular: Normal rate.   Pulmonary/Chest: Effort normal. No stridor. No respiratory distress. She has decreased breath sounds. She has  wheezes. She has no rales.  Abdominal: Soft. She exhibits no distension. There is no tenderness. There is no rebound.  Musculoskeletal: Normal range of motion.  Neurological: She is alert and oriented to person, place, and time.  Skin: Skin is warm and dry. She is not diaphoretic.    ED Course  Procedures (including critical care time)  Labs Reviewed  CBC WITH DIFFERENTIAL - Abnormal; Notable for the following:    RBC 5.34 (*)    Hemoglobin 15.6 (*)    All other components within normal limits  BASIC METABOLIC PANEL - Abnormal; Notable for the following:    Glucose, Bld 104 (*)    GFR calc non Af Amer 80 (*)    All other components within normal limits  D-DIMER, QUANTITATIVE - Abnormal; Notable for the following:    D-Dimer, Quant 1.07 (*)    All other components within normal limits  GLUCOSE, CAPILLARY - Abnormal; Notable for the following:    Glucose-Capillary 204 (*)    All other components within normal limits   Dg Chest 2 View  03/28/2012  *RADIOLOGY REPORT*  Clinical Data: Chest pain.  CHEST - 2 VIEW  Comparison: Chest x-ray 08/30/2010.  Findings: New airspace consolidation in the left upper lobe concerning for pneumonia.  Patchy linear predominant opacities throughout the mid and lower lungs bilaterally, similar to prior examinations, most consistent with chronic scarring.  No definite pleural effusions.  Pulmonary venous congestion, without frank pulmonary edema.  Heart size is normal.  Slight prominence of soft tissues in the left hilar region.  Atherosclerosis of the thoracic aorta.  IMPRESSION: 1.  Interval development of new air space consolidation in the left upper lobe concerning for pneumonia.  Given the left hilar prominence, follow-up radiographs in 2-3 weeks after appropriate trial of antimicrobial therapy is highly recommended to exclude the possibility of a central obstructing lesion with postobstructive pneumonia. 2.  Extensive bibasilar scarring, similar to prior  examinations. 3.  Atherosclerosis.   Original Report Authenticated By: Trudie Reed, M.D.      Date: 03/28/2012  Rate: 87  Rhythm: normal sinus rhythm  QRS Axis: normal  Intervals: PR prolonged  ST/T Wave abnormalities: nonspecific T wave changes  Conduction Disutrbances:first-degree A-V block   Narrative Interpretation:   Old EKG Reviewed: changes noted New inverted T waves laterally.     1. Community acquired pneumonia     MDM   Misty Ball  is a 79 y.o. female who presented to the ED with chest pain only while coughing.  On exam able to reproduce with palpation.  No CP at rest or signs of MI.  XR concerning for pneumonia.  C/w patient's symptoms and exam.  PE considered but unlikely given story, lack of tachycardia or hypoxia, and exam.  No CT indicated.  D-dimer likely 2/2 age and pneumonia.  Patient given return precautions.  Will treat pneumonia with levaquin.  Patient safe for discharge.  Patient to f/u closely with PCP.  Patient discharged.       Arloa Koh, MD 03/28/12 1642

## 2012-03-28 NOTE — ED Notes (Signed)
Dr. Wickline at the bedside.  

## 2012-03-28 NOTE — ED Notes (Signed)
Lt. Lateral chest pain began last night radiated into lt. Chest wall.  Pain is reproducible and increases with movement and cough.   Pt. Took  4 baby aspirin prior to coming in.  Pt. Also has clear productive cough.  Is being treated for bronchitis.

## 2012-03-28 NOTE — ED Notes (Signed)
Checked patient cbg it was 37 motified RN Lori of blood sugar

## 2012-03-28 NOTE — ED Notes (Signed)
Pt. Given a lunch and diet coke

## 2012-03-31 NOTE — ED Provider Notes (Signed)
I have personally seen and examined the patient.  I have discussed the plan of care with the resident.  I have reviewed the documentation on PMH/FH/Soc. History.  I have reviewed the documentation of the resident and agree.  I have reviewed and agree with the ECG interpretation(s) documented by the resident.  Her CP was reproducible on my exam.  Suspicion for occult PE low.  Pneumonia noted and pt tolerated ambulation and feel she is safe for d/c  Joya Gaskins, MD 03/31/12 1330

## 2012-04-11 ENCOUNTER — Encounter (INDEPENDENT_AMBULATORY_CARE_PROVIDER_SITE_OTHER): Payer: Medicare Other | Admitting: Ophthalmology

## 2012-04-13 ENCOUNTER — Encounter (INDEPENDENT_AMBULATORY_CARE_PROVIDER_SITE_OTHER): Payer: Medicare Other | Admitting: Ophthalmology

## 2012-04-13 DIAGNOSIS — E11359 Type 2 diabetes mellitus with proliferative diabetic retinopathy without macular edema: Secondary | ICD-10-CM

## 2012-04-13 DIAGNOSIS — I10 Essential (primary) hypertension: Secondary | ICD-10-CM

## 2012-04-13 DIAGNOSIS — H353290 Exudative age-related macular degeneration, unspecified eye, stage unspecified: Secondary | ICD-10-CM

## 2012-04-13 DIAGNOSIS — H43819 Vitreous degeneration, unspecified eye: Secondary | ICD-10-CM

## 2012-04-13 DIAGNOSIS — E1139 Type 2 diabetes mellitus with other diabetic ophthalmic complication: Secondary | ICD-10-CM

## 2012-04-13 DIAGNOSIS — H35039 Hypertensive retinopathy, unspecified eye: Secondary | ICD-10-CM

## 2012-04-18 ENCOUNTER — Ambulatory Visit (INDEPENDENT_AMBULATORY_CARE_PROVIDER_SITE_OTHER): Payer: Medicare Other | Admitting: Family Medicine

## 2012-04-18 ENCOUNTER — Ambulatory Visit: Payer: Medicare Other

## 2012-04-18 VITALS — BP 118/72 | HR 81 | Temp 98.1°F | Resp 18 | Ht 63.0 in | Wt 150.2 lb

## 2012-04-18 DIAGNOSIS — R5381 Other malaise: Secondary | ICD-10-CM

## 2012-04-18 DIAGNOSIS — E119 Type 2 diabetes mellitus without complications: Secondary | ICD-10-CM

## 2012-04-18 DIAGNOSIS — R05 Cough: Secondary | ICD-10-CM

## 2012-04-18 DIAGNOSIS — R5383 Other fatigue: Secondary | ICD-10-CM

## 2012-04-18 DIAGNOSIS — R0789 Other chest pain: Secondary | ICD-10-CM

## 2012-04-18 DIAGNOSIS — R059 Cough, unspecified: Secondary | ICD-10-CM

## 2012-04-18 LAB — POCT CBC
Granulocyte percent: 55.7 %G (ref 37–80)
HCT, POC: 48.3 % — AB (ref 37.7–47.9)
Hemoglobin: 15.6 g/dL (ref 12.2–16.2)
Lymph, poc: 3.8 — AB (ref 0.6–3.4)
MCH, POC: 28.9 pg (ref 27–31.2)
MCHC: 32.3 g/dL (ref 31.8–35.4)
MCV: 89.6 fL (ref 80–97)
MID (cbc): 0.6 (ref 0–0.9)
MPV: 9.5 fL (ref 0–99.8)
POC Granulocyte: 5.6 (ref 2–6.9)
POC LYMPH PERCENT: 38.4 %L (ref 10–50)
POC MID %: 5.9 %M (ref 0–12)
Platelet Count, POC: 391 10*3/uL (ref 142–424)
RBC: 5.39 M/uL (ref 4.04–5.48)
RDW, POC: 14.6 %
WBC: 10 10*3/uL (ref 4.6–10.2)

## 2012-04-18 LAB — GLUCOSE, POCT (MANUAL RESULT ENTRY): POC Glucose: 209 mg/dl — AB (ref 70–99)

## 2012-04-18 MED ORDER — HYDROCODONE-HOMATROPINE 5-1.5 MG/5ML PO SYRP: 5.0000 mL | ORAL_SOLUTION | Freq: Three times a day (TID) | ORAL | Status: DC | PRN

## 2012-04-18 NOTE — Progress Notes (Signed)
79 yo IDDM patient with recent pneumonia dx'd at ED and she was given Rx for antibiotic along with steroid shot.  She still feels very tired.  Intermittent cough with chest pain after coughing.  Not short of breath.  Still smoking.  No polyuria or polydipsia or dysuria.  Objective: cheerful, good color Chest: few right basilar rales Oroph:  Reddened Neck: no adenopathy Extrem:  No edema Neuro:  Alert, moving 4 extremities equally.   [facility] reading (PRIMARY) by  Dr. Milus Glazier:  Persistent left upper lobe infiltrate Results for orders placed in visit on 04/18/12  GLUCOSE, POCT (MANUAL RESULT ENTRY)      Result Value Range   POC Glucose 209 (*) 70 - 99 mg/dl  POCT CBC      Result Value Range   WBC 10.0  4.6 - 10.2 K/uL   Lymph, poc 3.8 (*) 0.6 - 3.4   POC LYMPH PERCENT 38.4  10 - 50 %L   MID (cbc) 0.6  0 - 0.9   POC MID % 5.9  0 - 12 %M   POC Granulocyte 5.6  2 - 6.9   Granulocyte percent 55.7  37 - 80 %G   RBC 5.39  4.04 - 5.48 M/uL   Hemoglobin 15.6  12.2 - 16.2 g/dL   HCT, POC 16.1 (*) 09.6 - 47.9 %   MCV 89.6  80 - 97 fL   MCH, POC 28.9  27 - 31.2 pg   MCHC 32.3  31.8 - 35.4 g/dL   RDW, POC 04.5     Platelet Count, POC 391  142 - 424 K/uL   MPV 9.5  0 - 99.8 fL   Assessment:  Persistent left upper lobe infiltrate with fatigue, malaise, cough  Plan:  CT lung.  Will probably need to refer to pulmonary

## 2012-04-18 NOTE — Patient Instructions (Addendum)
We need to work up the lung problem with a cat scan [facility] can do this.    Driving directions to [address], [place], [place] [address] [id number]  - more info    [address]  [place], [place] [address]     1. Head south on [address] toward [address]      0.5 mi    2. Sharp left onto [address]      0.6 mi    3. Turn left onto [address]      0.2 mi    4. [address]ge [address]      3.0 mi    5. Continue straight to stay on [address]      0.4 mi    6. Slight left to stay on [facility]  Destination will be on the right     1.0 mi     [address] [place], [place] [address]

## 2012-04-19 ENCOUNTER — Ambulatory Visit
Admission: RE | Admit: 2012-04-19 | Discharge: 2012-04-19 | Disposition: A | Discharge status: Home / Self Care | Payer: Medicare Other | Source: Ambulatory Visit | Attending: Family Medicine | Admitting: Family Medicine

## 2012-04-19 LAB — COMPREHENSIVE METABOLIC PANEL
ALT: 8 U/L (ref 0–35)
AST: 15 U/L (ref 0–37)
Albumin: 3.8 g/dL (ref 3.5–5.2)
Alkaline Phosphatase: 74 U/L (ref 39–117)
BUN: 17 mg/dL (ref 6–23)
CO2: 28 mEq/L (ref 19–32)
Calcium: 9.7 mg/dL (ref 8.4–10.5)
Chloride: 97 mEq/L (ref 96–112)
Creat: 0.8 mg/dL (ref 0.50–1.10)
Glucose, Bld: 207 mg/dL — ABNORMAL HIGH (ref 70–99)
Potassium: 4.4 mEq/L (ref 3.5–5.3)
Sodium: 134 mEq/L — ABNORMAL LOW (ref 135–145)
Total Bilirubin: 0.3 mg/dL (ref 0.3–1.2)
Total Protein: 6.9 g/dL (ref 6.0–8.3)

## 2012-04-21 ENCOUNTER — Other Ambulatory Visit: Payer: Self-pay | Admitting: Radiology

## 2012-04-21 ENCOUNTER — Other Ambulatory Visit: Payer: Self-pay | Admitting: Family Medicine

## 2012-04-21 DIAGNOSIS — R918 Other nonspecific abnormal finding of lung field: Secondary | ICD-10-CM

## 2012-04-25 ENCOUNTER — Telehealth: Payer: Self-pay

## 2012-04-25 NOTE — Telephone Encounter (Signed)
appt with dr wert on 05/05/12 at 1100 and they do have her on a waiting list. Patient provided the information for the appt. And the number there.

## 2012-04-25 NOTE — Telephone Encounter (Signed)
DR LAUENSTEIN - WANTS TO KNOW WHEN SHE IS SUPPOSED TO HAVE THE VISIT WITH THE LUNG SPECIALIST.  SHE HAS NOT HEARD ANYTHING AND IS SITTING AROUND WAITING.  SHE IS VERY WORRIED. [phone]

## 2012-05-05 ENCOUNTER — Encounter: Payer: Self-pay | Admitting: Internal Medicine

## 2012-05-05 ENCOUNTER — Ambulatory Visit (INDEPENDENT_AMBULATORY_CARE_PROVIDER_SITE_OTHER): Payer: Medicare Other | Admitting: Internal Medicine

## 2012-05-05 VITALS — BP 124/80 | HR 78 | Temp 97.4°F | Ht 64.0 in | Wt 148.0 lb

## 2012-05-05 DIAGNOSIS — F17200 Nicotine dependence, unspecified, uncomplicated: Secondary | ICD-10-CM

## 2012-05-05 DIAGNOSIS — R222 Localized swelling, mass and lump, trunk: Secondary | ICD-10-CM

## 2012-05-05 DIAGNOSIS — R918 Other nonspecific abnormal finding of lung field: Secondary | ICD-10-CM | POA: Insufficient documentation

## 2012-05-05 NOTE — Assessment & Plan Note (Signed)
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  Discussed in detail all the  indications, usual  risks and alternatives  relative to the benefits with patient and son who agreed to proceed with bronchoscopy with biopsy 4/2  See instructions for specific recommendations which were reviewed directly with the patient who was given a copy with highlighter outlining the key components.

## 2012-05-05 NOTE — Patient Instructions (Addendum)
Bronchoscopy is done at [facility] at 715 am April 1 with nothing to eat or drink after Midnight Monday 31st.  Stop aspirin after Saturday 3/29   Out patient registration at 715 am

## 2012-05-05 NOTE — Assessment & Plan Note (Signed)
Strongly advised to quit smoking

## 2012-05-05 NOTE — Progress Notes (Signed)
Subjective:    Patient ID: Misty Ball, female    DOB: [birth year]  MRN: [id number]  HPI  41 yowf smoker referred 05/05/2012 to pulmonary clinic by Dr Milus Glazier for eval for lung mass  ER eval 03/28/12: presented to the ED because of coughing, CP, and increase in mucous production. Patient reports that this started two weeks ago. Patient with history of COPD and recurrent bouts of bronchitis. Started coughing and having subjective fevers. Then over last few days will have sharp L sided CP. Only with coughing. None while at rest   05/05/2012 1st pulmonary eval acute ill since around the first of Feb 2014 as above rx with abx for presumed dx of pna and feeling a lot better p abx and steroids but still congested cough, min mucoid sputum, no cp, hemoptysis and not limited by sob but quite sedentary.   No obvious daytime variabilty or assoc   chest tightness, subjective wheeze overt sinus or hb symptoms. No unusual exp hx or h/o childhood pna/ asthma or premature birth to her knowledge.   Sleeping ok without nocturnal  or early am exacerbation  of respiratory  c/o's or need for noct saba. Also denies any obvious fluctuation of symptoms with weather or environmental changes or other aggravating or alleviating factors except as outlined above   Review of Systems  Constitutional: Positive for appetite change. Negative for fever, chills, diaphoresis, activity change, fatigue and unexpected weight change.  HENT: Positive for sneezing. Negative for hearing loss, ear pain, nosebleeds, congestion, sore throat, facial swelling, rhinorrhea, mouth sores, trouble swallowing, neck pain, neck stiffness, dental problem, voice change, postnasal drip, sinus pressure, tinnitus and ear discharge.   Eyes: Negative for photophobia, discharge, itching and visual disturbance.  Respiratory: Positive for cough. Negative for apnea, choking, chest tightness, shortness of breath, wheezing and stridor.   Cardiovascular:  Positive for chest pain. Negative for palpitations and leg swelling.  Gastrointestinal: Negative for nausea, vomiting, abdominal pain, constipation, blood in stool and abdominal distention.  Genitourinary: Negative for dysuria, urgency, frequency, hematuria, flank pain, decreased urine volume and difficulty urinating.  Musculoskeletal: Negative for myalgias, back pain, joint swelling, arthralgias and gait problem.  Skin: Positive for rash. Negative for color change and pallor.  Neurological: Positive for headaches. Negative for dizziness, tremors, seizures, syncope, speech difficulty, weakness, light-headedness and numbness.  Hematological: Negative for adenopathy. Does not bruise/bleed easily.  Psychiatric/Behavioral: Negative for confusion, sleep disturbance and agitation. The patient is not nervous/anxious.        Objective:   Physical Exam Wt Readings from Last 3 Encounters:  05/05/12 148 lb (67.132 kg)  04/18/12 150 lb 3.2 oz (68.13 kg)  03/02/12 150 lb (68.04 kg)     amb wf nad walks with cane Localized wheeze LUL  HEENT mild turbinate edema.  Oropharynx no thrush or excess pnd or cobblestoning.  No JVD or cervical adenopathy. Mild accessory muscle hypertrophy. Trachea midline, nl thryroid. Chest was hyperinflated by percussion with diminished breath sounds and moderate increased exp time with Localized wheeze LUL. Hoover sign positive at mid inspiration. Regular rate and rhythm without murmur gallop or rub or increase P2 or edema.  Abd: no hsm, nl excursion. Ext warm without cyanosis or clubbing.    CT chest 04/21/12 1. Large solid mass within the left upper lobe extending to the  pleura apically consistent with primary lung carcinoma with  mediastinal extension.  2. Suspect pulmonary arterial hypertension with prominent  pulmonary artery segments.  3. Diffuse coronary artery calcifications.  Assessment & Plan:

## 2012-05-09 ENCOUNTER — Encounter (HOSPITAL_COMMUNITY): Payer: Medicare Other

## 2012-05-10 ENCOUNTER — Encounter (HOSPITAL_COMMUNITY): Payer: Self-pay | Admitting: Internal Medicine

## 2012-05-10 ENCOUNTER — Encounter (HOSPITAL_COMMUNITY)
Admission: RE | Disposition: A | Discharge status: Home / Self Care | Payer: Self-pay | Source: Ambulatory Visit | Attending: Internal Medicine

## 2012-05-10 ENCOUNTER — Ambulatory Visit (HOSPITAL_COMMUNITY)
Admission: RE | Admit: 2012-05-10 | Discharge: 2012-05-10 | Disposition: A | Discharge status: Home / Self Care | Payer: Medicare Other | Source: Ambulatory Visit | Attending: Internal Medicine | Admitting: Internal Medicine

## 2012-05-10 ENCOUNTER — Encounter (INDEPENDENT_AMBULATORY_CARE_PROVIDER_SITE_OTHER): Payer: Medicare Other | Admitting: Ophthalmology

## 2012-05-10 DIAGNOSIS — J9819 Other pulmonary collapse: Secondary | ICD-10-CM | POA: Insufficient documentation

## 2012-05-10 DIAGNOSIS — R918 Other nonspecific abnormal finding of lung field: Secondary | ICD-10-CM

## 2012-05-10 DIAGNOSIS — F17200 Nicotine dependence, unspecified, uncomplicated: Secondary | ICD-10-CM | POA: Insufficient documentation

## 2012-05-10 DIAGNOSIS — R059 Cough, unspecified: Secondary | ICD-10-CM | POA: Insufficient documentation

## 2012-05-10 DIAGNOSIS — J4489 Other specified chronic obstructive pulmonary disease: Secondary | ICD-10-CM | POA: Insufficient documentation

## 2012-05-10 DIAGNOSIS — R222 Localized swelling, mass and lump, trunk: Secondary | ICD-10-CM | POA: Insufficient documentation

## 2012-05-10 DIAGNOSIS — R05 Cough: Secondary | ICD-10-CM | POA: Insufficient documentation

## 2012-05-10 DIAGNOSIS — I2510 Atherosclerotic heart disease of native coronary artery without angina pectoris: Secondary | ICD-10-CM | POA: Insufficient documentation

## 2012-05-10 DIAGNOSIS — J449 Chronic obstructive pulmonary disease, unspecified: Secondary | ICD-10-CM | POA: Insufficient documentation

## 2012-05-10 HISTORY — PX: VIDEO BRONCHOSCOPY: SHX5072

## 2012-05-10 SURGERY — VIDEO BRONCHOSCOPY WITHOUT FLUORO
Anesthesia: Moderate Sedation | Laterality: Bilateral

## 2012-05-10 MED ORDER — MEPERIDINE HCL 25 MG/ML IJ SOLN
INTRAMUSCULAR | Status: DC | PRN
  Administered 2012-05-10: 25 mg via INTRAVENOUS

## 2012-05-10 MED ORDER — MIDAZOLAM HCL 10 MG/2ML IJ SOLN
INTRAMUSCULAR | Status: DC | PRN
  Administered 2012-05-10: 2.5 mg via INTRAVENOUS

## 2012-05-10 MED ORDER — LIDOCAINE HCL 1 % IJ SOLN
INTRAMUSCULAR | Status: DC | PRN
  Administered 2012-05-10: 6 mL via RESPIRATORY_TRACT

## 2012-05-10 MED ORDER — SODIUM CHLORIDE 0.9 % IV SOLN
INTRAVENOUS | Status: DC
  Administered 2012-05-10: 08:00:00 via INTRAVENOUS

## 2012-05-10 MED ORDER — PHENYLEPHRINE HCL 0.25 % NA SOLN
NASAL | Status: DC | PRN
  Administered 2012-05-10: 2 via NASAL

## 2012-05-10 MED ORDER — LIDOCAINE HCL 2 % EX GEL
CUTANEOUS | Status: DC | PRN
  Administered 2012-05-10: 1

## 2012-05-10 MED ORDER — MIDAZOLAM HCL 10 MG/2ML IJ SOLN
INTRAMUSCULAR | Status: AC
  Filled 2012-05-10: qty 4

## 2012-05-10 MED ORDER — MEPERIDINE HCL 100 MG/ML IJ SOLN
INTRAMUSCULAR | Status: AC
  Filled 2012-05-10: qty 2

## 2012-05-10 NOTE — H&P (Signed)
67 yowf smoker referred 05/05/2012 to pulmonary clinic by Dr Milus Glazier for eval for lung mass   ER eval 03/28/12: presented to the ED because of coughing, CP, and increase in mucous production. Patient reports that this started two weeks ago. Patient with history of COPD and recurrent bouts of bronchitis. Started coughing and having subjective fevers. Then over last few days will have sharp L sided CP. Only with coughing. None while at rest     05/05/2012 1st pulmonary eval acute ill since around the first of Feb 2014 as above rx with abx for presumed dx of pna and feeling a lot better p abx and steroids but still congested cough, min mucoid sputum, no cp, hemoptysis and not limited by sob but quite sedentary.    No obvious daytime variabilty or assoc   chest tightness, subjective wheeze overt sinus or hb symptoms. No unusual exp hx or h/o childhood pna/ asthma or premature birth to her knowledge.    Sleeping ok without nocturnal  or early am exacerbation  of respiratory  c/o's or need for noct saba. Also denies any obvious fluctuation of symptoms with weather or environmental changes or other aggravating or alleviating factors except as outlined above     Review of Systems  Constitutional: Positive for appetite change. Negative for fever, chills, diaphoresis, activity change, fatigue and unexpected weight change.  HENT: Positive for sneezing. Negative for hearing loss, ear pain, nosebleeds, congestion, sore throat, facial swelling, rhinorrhea, mouth sores, trouble swallowing, neck pain, neck stiffness, dental problem, voice change, postnasal drip, sinus pressure, tinnitus and ear discharge.   Eyes: Negative for photophobia, discharge, itching and visual disturbance.  Respiratory: Positive for cough. Negative for apnea, choking, chest tightness, shortness of breath, wheezing and stridor.   Cardiovascular: Positive for chest pain. Negative for palpitations and leg swelling.  Gastrointestinal:  Negative for nausea, vomiting, abdominal pain, constipation, blood in stool and abdominal distention.  Genitourinary: Negative for dysuria, urgency, frequency, hematuria, flank pain, decreased urine volume and difficulty urinating.  Musculoskeletal: Negative for myalgias, back pain, joint swelling, arthralgias and gait problem.  Skin: Positive for rash. Negative for color change and pallor.  Neurological: Positive for headaches. Negative for dizziness, tremors, seizures, syncope, speech difficulty, weakness, light-headedness and numbness.  Hematological: Negative for adenopathy. Does not bruise/bleed easily.  Psychiatric/Behavioral: Negative for confusion, sleep disturbance and agitation. The patient is not nervous/anxious.     05/10/2012 FOB/MW no change in hx or exam      Objective:     Physical Exam Wt Readings from Last 3 Encounters:   05/05/12  148 lb (67.132 kg)   04/18/12  150 lb 3.2 oz (68.13 kg)   03/02/12  150 lb (68.04 kg)        amb wf nad walks with cane Localized wheeze LUL   HEENT mild turbinate edema.  Oropharynx no thrush or excess pnd or cobblestoning.  No JVD or cervical adenopathy. Mild accessory muscle hypertrophy. Trachea midline, nl thryroid. Chest was hyperinflated by percussion with diminished breath sounds and moderate increased exp time with Localized wheeze LUL. Hoover sign positive at mid inspiration. Regular rate and rhythm without murmur gallop or rub or increase P2 or edema.  Abd: no hsm, nl excursion. Ext warm without cyanosis or clubbing.     CT chest 04/21/12 1. Large solid mass within the left upper lobe extending to the   pleura apically consistent with primary lung carcinoma with   mediastinal extension.   2. Suspect pulmonary arterial hypertension  with prominent   pulmonary artery segments.   3. Diffuse coronary artery calcifications.           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   Discussed in detail all the  indications, usual  risks and alternatives  relative to the benefits with patient and son who agreed to proceed with bronchoscopy with biopsy 4/2   See instructions for specific recommendations which were reviewed directly with the patient who was given a copy with highlighter outlining the key components.          Smoker - Nyoka Cowden, MD at 05/05/2012 12:58 PM    Status: Written Related Problem: Smoker           Strongly advised to quit smoking               Not recorded       Discontinued Medications      Reason for Discontinue    betamethasone dipropionate 0.05 % lotion      HYDROcodone-homatropine (HYCODAN) 5-1.5 MG/5ML syrup Completed Course           Sandrea Hughs, MD Pulmonary and Critical Care Medicine [facility] Cell [phone] After 5:30 PM or weekends, call [phone]

## 2012-05-10 NOTE — Op Note (Signed)
Bronchoscopy Procedure Note  Date of Operation: 05/10/2012   Pre-op Diagnosis: Atelectasis LUL  Post-op Diagnosis: Same  Surgeon: Sandrea Hughs  Anesthesia: Monitored Local Anesthesia with Sedation  Operation: Video Flexible fiberoptic bronchoscopy, diagnostic   Findings: narrowing of LUL orifice, no def endobronchial process   Specimen: washing, bx LUL orifice AP segment  Estimated Blood Loss: Min   Complications:None  Indications and History: See updated H and P same date. The risks, benefits, complications, treatment options and expected outcomes were discussed with the patient.  The possibilities of reaction to medication, pulmonary aspiration, perforation of a viscus, bleeding, failure to diagnose a condition and creating a complication requiring transfusion or operation were discussed with the patient who freely signed the consent.    Description of Procedure: The patient was re-examined in the bronchoscopy suite and the site of surgery properly noted/marked.  The patient was identified  and the procedure verified as Flexible Fiberoptic Bronchoscopy.  A Time Out was held and the above information confirmed.   After the induction of topical nasopharyngeal anesthesia, the patient was positioned  and the bronchoscope was passed through the R naris. The vocal cords were visualized and  1% buffered lidocaine 5 ml was topically placed onto the cords. The cords were nl. The scope was then passed into the trachea.  1% buffered lidocaine given topically. Airways inspected bilaterally to the subsegmental level with the following findings:  Airways to subsegmental level clear bilaterally x for LUL AP segment, swollen and poorly viz due to anatomy  Procedure Washing LUL Endobronchial Bx LUL      The Patient was taken to the Endoscopy Recovery area in satisfactory condition.  Attestation: I performed the procedure.  Sandrea Hughs, MD Pulmonary and Critical Care Medicine [facility] Cell [phone] After 5:30 PM or weekends, call [phone]

## 2012-05-10 NOTE — Progress Notes (Signed)
Caesar Chestnut, RN from ICU here to check CBG.  CBG= 103 GBG results did not cross over into epic flowsheet.

## 2012-05-10 NOTE — Progress Notes (Signed)
Bronch w/ video intervention performed.  Bronchial washing intervention performed, bronchial biopsy intervention performed.   

## 2012-05-11 ENCOUNTER — Encounter (HOSPITAL_COMMUNITY): Payer: Self-pay | Admitting: Internal Medicine

## 2012-05-11 ENCOUNTER — Ambulatory Visit (INDEPENDENT_AMBULATORY_CARE_PROVIDER_SITE_OTHER): Payer: Medicare Other | Admitting: Family Medicine

## 2012-05-11 VITALS — BP 140/70 | HR 85 | Temp 97.8°F | Resp 16 | Ht 63.0 in | Wt 148.6 lb

## 2012-05-11 DIAGNOSIS — R079 Chest pain, unspecified: Secondary | ICD-10-CM

## 2012-05-11 DIAGNOSIS — R918 Other nonspecific abnormal finding of lung field: Secondary | ICD-10-CM

## 2012-05-11 DIAGNOSIS — L309 Dermatitis, unspecified: Secondary | ICD-10-CM

## 2012-05-11 DIAGNOSIS — R042 Hemoptysis: Secondary | ICD-10-CM

## 2012-05-11 DIAGNOSIS — L259 Unspecified contact dermatitis, unspecified cause: Secondary | ICD-10-CM

## 2012-05-11 MED ORDER — TRIAMCINOLONE ACETONIDE 0.1 % EX CREA: 1.0000 "application " | TOPICAL_CREAM | Freq: Two times a day (BID) | CUTANEOUS | Status: DC

## 2012-05-11 NOTE — Progress Notes (Signed)
79 yo woman with persistent cough and LUL infiltrate, possibly lung cancer, who underwent bronchoscopy with biopsy done yesterday.  Dr. Sherene Sires was "very sweet" to patient which she appreciated.  She is not experiencing any after effects. The coughing is less, and today there was no blood. Yesterday there was scant hemoptysis. Chest pain has resolved  Objective:  NAD Lungs:  Clear with better breath sounds left side Heart:  Regular Extremities:  Dry scaly lower extremities.  Assessment:  Awaiting biopsy, eczema  Plan: Eczema - Plan: triamcinolone cream (KENALOG) 0.1 %

## 2012-05-11 NOTE — Progress Notes (Signed)
Quick Note:  Spoke with pt and notified of results per Dr. Wert. Pt verbalized understanding and denied any questions.  ______ 

## 2012-05-17 ENCOUNTER — Ambulatory Visit (INDEPENDENT_AMBULATORY_CARE_PROVIDER_SITE_OTHER)
Admission: RE | Admit: 2012-05-17 | Discharge: 2012-05-17 | Disposition: A | Discharge status: Home / Self Care | Payer: Medicare Other | Source: Ambulatory Visit | Attending: Internal Medicine | Admitting: Internal Medicine

## 2012-05-17 ENCOUNTER — Ambulatory Visit (INDEPENDENT_AMBULATORY_CARE_PROVIDER_SITE_OTHER): Payer: Medicare Other | Admitting: Internal Medicine

## 2012-05-17 ENCOUNTER — Encounter: Payer: Self-pay | Admitting: Internal Medicine

## 2012-05-17 ENCOUNTER — Other Ambulatory Visit: Payer: Self-pay | Admitting: Internal Medicine

## 2012-05-17 VITALS — BP 124/80 | HR 91 | Temp 97.0°F | Ht 64.0 in | Wt 147.0 lb

## 2012-05-17 DIAGNOSIS — R918 Other nonspecific abnormal finding of lung field: Secondary | ICD-10-CM

## 2012-05-17 DIAGNOSIS — R059 Cough, unspecified: Secondary | ICD-10-CM

## 2012-05-17 DIAGNOSIS — F17200 Nicotine dependence, unspecified, uncomplicated: Secondary | ICD-10-CM

## 2012-05-17 DIAGNOSIS — R05 Cough: Secondary | ICD-10-CM

## 2012-05-17 DIAGNOSIS — R042 Hemoptysis: Secondary | ICD-10-CM

## 2012-05-17 DIAGNOSIS — R9389 Abnormal findings on diagnostic imaging of other specified body structures: Secondary | ICD-10-CM

## 2012-05-17 DIAGNOSIS — R222 Localized swelling, mass and lump, trunk: Secondary | ICD-10-CM

## 2012-05-17 NOTE — Assessment & Plan Note (Signed)
-   See CT chest 04/19/12 - FOB   05/10/12 >>> extrinsic narrowing only, neg cyt, neg bx - 05/17/2012 cxr no better > rec PET and ? ebus vs fna if any distal mets accessible  Discussed in detail all the  indications, usual  risks and alternatives  relative to the benefits with patient who agrees to proceed with PET scanning as next step

## 2012-05-17 NOTE — Progress Notes (Signed)
Quick Note:  Spoke with pt and notified of results per Dr. Sherene Sires. Pt verbalized understanding and denied any questions. Order sent to [facility] ______

## 2012-05-17 NOTE — Assessment & Plan Note (Signed)
She says she's cut way back and I believe this is the reason she has improved in terms of symptoms; unfortunately, it may be a case of too little too late based on the chest ct which suggest III A vs B Lung ca.   rec complete cessation

## 2012-05-17 NOTE — Progress Notes (Signed)
Subjective:    Patient ID: Misty Ball, female    DOB: 06-22-1931  MRN: [id number]  HPI  86 yowf smoker referred 05/05/2012 to pulmonary clinic by Dr Milus Glazier for eval for lung mass  ER eval 03/28/12: presented to the ED because of coughing, CP, and increase in mucous production. Patient reports that this started two weeks ago. Patient with history of COPD and recurrent bouts of bronchitis. Started coughing and having subjective fevers. Then over last few days will have sharp L sided CP. Only with coughing. None while at rest   05/05/2012 1st pulmonary eval acute ill since around the first of Feb 2014 as above rx with abx for presumed dx of pna and feeling a lot better p abx and steroids but still congested cough, min mucoid sputum, no cp, hemoptysis and not limited by sob but quite sedentary.  rec fob   FOB   05/10/12 >>> extrinsic narrowing only, neg cyt, neg bx  05/17/2012 f/u ov/Misty Ball for f/u abn cxr Chief Complaint  Patient presents with  . Follow-up    Pt states doing well, here to discuss results of bronch   completely better - no cough, no sob over baseline   No obvious daytime variabilty or assoc   chest tightness, subjective wheeze overt sinus or hb symptoms. No unusual exp hx or h/o childhood pna/ asthma or premature birth to her knowledge.   Sleeping ok without nocturnal  or early am exacerbation  of respiratory  c/o's or need for noct saba. Also denies any obvious fluctuation of symptoms with weather or environmental changes or other aggravating or alleviating factors except as outlined above   ROS  The following are not active complaints unless bolded sore throat, dysphagia, dental problems, itching, sneezing,  nasal congestion or excess/ purulent secretions, ear ache,   fever, chills, sweats, unintended wt loss, pleuritic or exertional cp, hemoptysis,  orthopnea pnd or leg swelling, presyncope, palpitations, heartburn, abdominal pain, anorexia, nausea, vomiting, diarrhea   or change in bowel or urinary habits, change in stools or urine, dysuria,hematuria,  rash, arthralgias, visual complaints, headache, numbness weakness or ataxia or problems with walking or coordination,  change in mood/affect or memory.      .        Objective:   Physical Exa Wt Readings from Last 3 Encounters:  05/17/12 147 lb (66.679 kg)  05/11/12 148 lb 9.6 oz (67.405 kg)  05/05/12 148 lb (67.132 kg)       amb wf nad walks with cane Localized wheeze LUL no longer present.  HEENT mild turbinate edema.  Oropharynx no thrush or excess pnd or cobblestoning.  No JVD or cervical adenopathy. Mild accessory muscle hypertrophy. Trachea midline, nl thryroid. Chest was hyperinflated by percussion with diminished breath sounds and moderate increased exp time with Localized wheeze LUL. Hoover sign positive at mid inspiration. Regular rate and rhythm without murmur gallop or rub or increase P2 or edema.  Abd: no hsm, nl excursion. Ext warm without cyanosis or clubbing.    CT chest 04/21/12 1. Large solid mass within the left upper lobe extending to the  pleura apically consistent with primary lung carcinoma with  mediastinal extension.  2. Suspect pulmonary arterial hypertension with prominent  pulmonary artery segments.  3. Diffuse coronary artery calcifications.  CXR  05/17/2012 : Left upper lobe mass, corresponding to suspected primary  bronchogenic neoplasm on CT.  Mild postobstructive left upper lobe opacity is possible.  Mild patchy left lower lobe opacity, atelectasis versus  pneumonia.          Assessment & Plan:

## 2012-05-17 NOTE — Patient Instructions (Addendum)
Please remember to go to the  x-ray department downstairs for your tests - we will call you with the results when they are available.     

## 2012-05-19 ENCOUNTER — Encounter (INDEPENDENT_AMBULATORY_CARE_PROVIDER_SITE_OTHER): Payer: Medicare Other | Admitting: Ophthalmology

## 2012-05-19 DIAGNOSIS — H43819 Vitreous degeneration, unspecified eye: Secondary | ICD-10-CM

## 2012-05-19 DIAGNOSIS — H35039 Hypertensive retinopathy, unspecified eye: Secondary | ICD-10-CM

## 2012-05-19 DIAGNOSIS — I10 Essential (primary) hypertension: Secondary | ICD-10-CM

## 2012-05-19 DIAGNOSIS — H353290 Exudative age-related macular degeneration, unspecified eye, stage unspecified: Secondary | ICD-10-CM

## 2012-05-24 ENCOUNTER — Encounter (HOSPITAL_COMMUNITY)
Admission: RE | Admit: 2012-05-24 | Discharge: 2012-05-24 | Disposition: A | Discharge status: Home / Self Care | Payer: Medicare Other | Source: Ambulatory Visit | Attending: Internal Medicine | Admitting: Internal Medicine

## 2012-05-24 DIAGNOSIS — F17200 Nicotine dependence, unspecified, uncomplicated: Secondary | ICD-10-CM

## 2012-05-24 DIAGNOSIS — R042 Hemoptysis: Secondary | ICD-10-CM | POA: Insufficient documentation

## 2012-05-24 DIAGNOSIS — R918 Other nonspecific abnormal finding of lung field: Secondary | ICD-10-CM

## 2012-05-24 DIAGNOSIS — R9389 Abnormal findings on diagnostic imaging of other specified body structures: Secondary | ICD-10-CM

## 2012-05-24 DIAGNOSIS — R05 Cough: Secondary | ICD-10-CM

## 2012-05-24 DIAGNOSIS — C3410 Malignant neoplasm of upper lobe, unspecified bronchus or lung: Secondary | ICD-10-CM | POA: Insufficient documentation

## 2012-05-24 DIAGNOSIS — J90 Pleural effusion, not elsewhere classified: Secondary | ICD-10-CM | POA: Insufficient documentation

## 2012-05-24 LAB — GLUCOSE, CAPILLARY: Glucose-Capillary: 132 mg/dL — ABNORMAL HIGH (ref 70–99)

## 2012-05-24 MED ORDER — FLUDEOXYGLUCOSE F - 18 (FDG) INJECTION
20.3000 | Freq: Once | INTRAVENOUS | Status: AC | PRN
  Administered 2012-05-24: 20.3 via INTRAVENOUS

## 2012-05-29 ENCOUNTER — Encounter: Payer: Self-pay | Admitting: Internal Medicine

## 2012-05-31 NOTE — Progress Notes (Signed)
Quick Note:  Spoke with pt and scheduled her to see MW on 06/16/12 at 9:45 ______

## 2012-06-01 ENCOUNTER — Ambulatory Visit (INDEPENDENT_AMBULATORY_CARE_PROVIDER_SITE_OTHER): Payer: Medicare Other | Admitting: Family Medicine

## 2012-06-01 ENCOUNTER — Telehealth: Payer: Self-pay

## 2012-06-01 ENCOUNTER — Encounter: Payer: Self-pay | Admitting: Family Medicine

## 2012-06-01 VITALS — BP 154/75 | HR 77 | Temp 98.0°F | Resp 18 | Ht 64.0 in | Wt 149.0 lb

## 2012-06-01 DIAGNOSIS — C3490 Malignant neoplasm of unspecified part of unspecified bronchus or lung: Secondary | ICD-10-CM

## 2012-06-01 DIAGNOSIS — IMO0001 Reserved for inherently not codable concepts without codable children: Secondary | ICD-10-CM

## 2012-06-01 DIAGNOSIS — F411 Generalized anxiety disorder: Secondary | ICD-10-CM

## 2012-06-01 DIAGNOSIS — R05 Cough: Secondary | ICD-10-CM

## 2012-06-01 DIAGNOSIS — C3492 Malignant neoplasm of unspecified part of left bronchus or lung: Secondary | ICD-10-CM

## 2012-06-01 DIAGNOSIS — R059 Cough, unspecified: Secondary | ICD-10-CM

## 2012-06-01 LAB — COMPREHENSIVE METABOLIC PANEL WITH GFR
ALT: 9 U/L (ref 0–35)
AST: 15 U/L (ref 0–37)
Albumin: 3.5 g/dL (ref 3.5–5.2)
Alkaline Phosphatase: 70 U/L (ref 39–117)
BUN: 10 mg/dL (ref 6–23)
CO2: 29 meq/L (ref 19–32)
Calcium: 9.1 mg/dL (ref 8.4–10.5)
Chloride: 99 meq/L (ref 96–112)
Creat: 0.66 mg/dL (ref 0.50–1.10)
Glucose, Bld: 197 mg/dL — ABNORMAL HIGH (ref 70–99)
Potassium: 4.3 meq/L (ref 3.5–5.3)
Sodium: 135 meq/L (ref 135–145)
Total Bilirubin: 0.5 mg/dL (ref 0.3–1.2)
Total Protein: 6.6 g/dL (ref 6.0–8.3)

## 2012-06-01 LAB — POCT GLYCOSYLATED HEMOGLOBIN (HGB A1C): Hemoglobin A1C: 8.9

## 2012-06-01 MED ORDER — INSULIN GLARGINE 100 UNIT/ML ~~LOC~~ SOLN: 42.0000 [IU] | Freq: Every day | SUBCUTANEOUS | Status: DC

## 2012-06-01 MED ORDER — INSULIN GLARGINE 100 UNIT/ML ~~LOC~~ SOLN: SUBCUTANEOUS | Status: DC

## 2012-06-01 MED ORDER — METHYLPREDNISOLONE ACETATE 80 MG/ML IJ SUSP
80.0000 mg | Freq: Once | INTRAMUSCULAR | Status: AC
  Administered 2012-06-01: 80 mg via INTRAMUSCULAR

## 2012-06-01 MED ORDER — DOXEPIN HCL 25 MG PO CAPS: 25.0000 mg | ORAL_CAPSULE | Freq: Every day | ORAL | Status: DC

## 2012-06-01 NOTE — Progress Notes (Signed)
79 yo woman with LUL mass, recently having PET scan which suggested a bronchogenic carcinoma.  I shared this information with patient.  Still coughing (not as bad), no hemoptysis, with intermittent mild LUL chest pains.  She gets dyspneic with housework (not new). Appetite is unchanged.  Hasn't checked sugar lately.  No skin changes.   Objective:  NAD Good color Extremities:  No edema. Chest: decreased BS left anterior upper chest Heart:  Regular, no murmur Skin: no rash noted. No cervical or axillary adenopathy Results for orders placed in visit on 06/01/12  POCT GLYCOSYLATED HEMOGLOBIN (HGB A1C)      Result Value Range   Hemoglobin A1C 8.9       Assessment:  Patient seems to be taking the news of her PET scan well.  No evidence for spread of disease.  The diabetic control continues to be a problem, but I felt with patient's cough and ongoing chest pain, the depo medrol might offer some relief.  At any rate, the diabetes is probably much less worrisome than the carcinoma that needs careful attention.  Anxiety state, unspecified - Plan: doxepin (SINEQUAN) 25 MG capsule  Type II or unspecified type diabetes mellitus without mention of complication, uncontrolled - Plan: insulin glargine (LANTUS) 100 UNIT/ML injection, POCT glycosylated hemoglobin (Hb A1C)  Bronchogenic cancer, left - Plan: Comprehensive metabolic panel

## 2012-06-01 NOTE — Telephone Encounter (Signed)
Pharmacist called to request change of Lantus Rx sent in today as vials to the Solostar pens pt normally uses. After checking with Dr L, am sending in Rx for pens.

## 2012-06-09 LAB — FUNGUS CULTURE W SMEAR

## 2012-06-16 ENCOUNTER — Ambulatory Visit (INDEPENDENT_AMBULATORY_CARE_PROVIDER_SITE_OTHER): Payer: Medicare Other | Admitting: Internal Medicine

## 2012-06-16 ENCOUNTER — Encounter: Payer: Self-pay | Admitting: Internal Medicine

## 2012-06-16 VITALS — BP 128/72 | HR 75 | Temp 97.7°F | Ht 64.0 in | Wt 147.8 lb

## 2012-06-16 DIAGNOSIS — F17200 Nicotine dependence, unspecified, uncomplicated: Secondary | ICD-10-CM

## 2012-06-16 DIAGNOSIS — R222 Localized swelling, mass and lump, trunk: Secondary | ICD-10-CM

## 2012-06-16 DIAGNOSIS — R918 Other nonspecific abnormal finding of lung field: Secondary | ICD-10-CM

## 2012-06-16 NOTE — Patient Instructions (Addendum)
Please see patient coordinator before you leave today  to schedule CT guided biopsy of your lymph node at [facility]

## 2012-06-17 NOTE — Assessment & Plan Note (Signed)
Strongly rec she stop completely at this point

## 2012-06-17 NOTE — Assessment & Plan Note (Signed)
I had an extended discussion with the patient and her two children today lasting 15 to 20 minutes of a 25 minute visit on the following issues:   Likely this is stage IV LUng ca so goal is tissue dx with least invasive means. Discussed in detail all the  indications, usual  risks and alternatives  relative to the benefits with patient who agrees to proceed with IR bx CT vs u/s guided per radiology choice

## 2012-06-17 NOTE — Progress Notes (Signed)
Subjective:    Patient ID: Misty Ball, female    DOB: 1931/09/29  MRN: [id number]  HPI  47 yowf smoker referred 05/05/2012 to pulmonary clinic by Dr Milus Glazier for eval for lung mass  ER eval 03/28/12: presented to the ED because of coughing, CP, and increase in mucous production. Patient reports that this started two weeks ago. Patient with history of COPD and recurrent bouts of bronchitis. Started coughing and having subjective fevers. Then over last few days will have sharp L sided CP. Only with coughing. None while at rest   05/05/2012 1st pulmonary eval acute ill since around the first of Feb 2014 as above rx with abx for presumed dx of pna and feeling a lot better p abx and steroids but still congested cough, min mucoid sputum, no cp, hemoptysis and not limited by sob but quite sedentary.  rec fob   FOB   05/10/12 >>> extrinsic narrowing only, neg cyt, neg bx  05/17/2012 f/u ov/Shanitra Phillippi for f/u abn cxr Chief Complaint  Patient presents with  . Follow-up    Pt states doing well, here to discuss results of bronch   completely better - no cough, no sob over baseline  rec PET > Pos Stage IV lung ca   06/16/2012 f/u ov/Kaitelyn Jamison re Pos pet Chief Complaint  Patient presents with  . Follow-up    Discuss results of PET scan. Cough has improved since the last visit.    no limiting sob  No obvious daytime variabilty or assoc   chest tightness, subjective wheeze overt sinus or hb symptoms. No unusual exp hx or h/o childhood pna/ asthma or premature birth to her knowledge.   Sleeping ok without nocturnal  or early am exacerbation  of respiratory  c/o's or need for noct saba. Also denies any obvious fluctuation of symptoms with weather or environmental changes or other aggravating or alleviating factors except as outlined above   Current Medications, Allergies, Past Medical History, Past Surgical History, Family History, and Social History were reviewed in [employer]  record.  ROS  The following are not active complaints unless bolded sore throat, dysphagia, dental problems, itching, sneezing,  nasal congestion or excess/ purulent secretions, ear ache,   fever, chills, sweats, unintended wt loss, pleuritic or exertional cp, hemoptysis,  orthopnea pnd or leg swelling, presyncope, palpitations, heartburn, abdominal pain, anorexia, nausea, vomiting, diarrhea  or change in bowel or urinary habits, change in stools or urine, dysuria,hematuria,  rash, arthralgias, visual complaints, headache, numbness weakness or ataxia or problems with walking or coordination,  change in mood/affect or memory.    .      .        Objective:   Physical Exam  Wt Readings from Last 3 Encounters:  06/16/12 147 lb 12.8 oz (67.042 kg)  06/01/12 149 lb (67.586 kg)  05/17/12 147 lb (66.679 kg)         amb wf nad walks with cane Localized wheeze LUL no longer present.  HEENT mild turbinate edema.  Oropharynx no thrush or excess pnd or cobblestoning.  No JVD or cervical adenopathy. Mild accessory muscle hypertrophy. Trachea midline, nl thryroid. Chest was hyperinflated by percussion with diminished breath sounds and moderate increased exp time with Localized wheeze LUL. Hoover sign positive at mid inspiration. Regular rate and rhythm without murmur gallop or rub or increase P2 or edema.  Abd: no hsm, nl excursion. Ext warm without cyanosis or clubbing.    See PET 05/24/12  Assessment & Plan:

## 2012-06-19 ENCOUNTER — Other Ambulatory Visit: Payer: Self-pay | Admitting: Radiology

## 2012-06-19 ENCOUNTER — Encounter (HOSPITAL_COMMUNITY): Payer: Self-pay | Admitting: Pharmacy Technician

## 2012-06-22 ENCOUNTER — Encounter (HOSPITAL_COMMUNITY): Payer: Self-pay

## 2012-06-22 ENCOUNTER — Encounter: Payer: Self-pay | Admitting: Internal Medicine

## 2012-06-22 ENCOUNTER — Ambulatory Visit (HOSPITAL_COMMUNITY)
Admission: RE | Admit: 2012-06-22 | Discharge: 2012-06-22 | Disposition: A | Discharge status: Home / Self Care | Payer: Medicare Other | Source: Ambulatory Visit | Attending: Internal Medicine | Admitting: Internal Medicine

## 2012-06-22 DIAGNOSIS — J984 Other disorders of lung: Secondary | ICD-10-CM | POA: Insufficient documentation

## 2012-06-22 DIAGNOSIS — R599 Enlarged lymph nodes, unspecified: Secondary | ICD-10-CM | POA: Insufficient documentation

## 2012-06-22 DIAGNOSIS — C801 Malignant (primary) neoplasm, unspecified: Secondary | ICD-10-CM | POA: Insufficient documentation

## 2012-06-22 DIAGNOSIS — R918 Other nonspecific abnormal finding of lung field: Secondary | ICD-10-CM

## 2012-06-22 DIAGNOSIS — C770 Secondary and unspecified malignant neoplasm of lymph nodes of head, face and neck: Secondary | ICD-10-CM | POA: Insufficient documentation

## 2012-06-22 LAB — CBC
HCT: 45.1 % (ref 36.0–46.0)
Hemoglobin: 15.7 g/dL — ABNORMAL HIGH (ref 12.0–15.0)
MCH: 29.1 pg (ref 26.0–34.0)
MCV: 83.5 fL (ref 78.0–100.0)
RBC: 5.4 MIL/uL — ABNORMAL HIGH (ref 3.87–5.11)

## 2012-06-22 LAB — AFB CULTURE WITH SMEAR (NOT AT ARMC)
Acid Fast Smear: NONE SEEN
Special Requests: NORMAL

## 2012-06-22 MED ORDER — MIDAZOLAM HCL 2 MG/2ML IJ SOLN
INTRAMUSCULAR | Status: AC
  Filled 2012-06-22: qty 4

## 2012-06-22 MED ORDER — FENTANYL CITRATE 0.05 MG/ML IJ SOLN
INTRAMUSCULAR | Status: AC | PRN
  Administered 2012-06-22: 50 ug via INTRAVENOUS

## 2012-06-22 MED ORDER — MIDAZOLAM HCL 2 MG/2ML IJ SOLN
INTRAMUSCULAR | Status: AC | PRN
  Administered 2012-06-22: 2 mg via INTRAVENOUS

## 2012-06-22 MED ORDER — FENTANYL CITRATE 0.05 MG/ML IJ SOLN
INTRAMUSCULAR | Status: AC
  Filled 2012-06-22: qty 4

## 2012-06-22 MED ORDER — SODIUM CHLORIDE 0.9 % IV SOLN
Freq: Once | INTRAVENOUS | Status: AC
  Administered 2012-06-22: 08:00:00 via INTRAVENOUS

## 2012-06-22 NOTE — H&P (Signed)
Chief Complaint: "I'm here for a biopsy" Referring Physician:Wert HPI: Misty Ball is an 79 y.o. female with lung mass. Her workup has included a PET scan. IR is asked to obtain tissue sample and PET review finds cervical LAD amenable to biopsy/FNA. She is scheduled today for US guided biopsy/FNA of cervical LN. PMHx and meds reviewed.   Past Medical History:  Past Medical History  Diagnosis Date  . Hypertension   . Pancreatitis   . Diabetes mellitus   . Thyroid disease     Past Surgical History:  Past Surgical History  Procedure Laterality Date  . Abdominal hysterectomy    . Cholecystectomy    . Appendectomy    . Video bronchoscopy Bilateral 05/10/2012    Procedure: VIDEO BRONCHOSCOPY WITHOUT FLUORO;  Surgeon: Nyoka Cowden, MD;  Location: [facility];  Service: Endoscopy;  Laterality: Bilateral;    Family History: No family history on file.  Social History:  reports that she has been smoking Cigarettes.  She has a 26 pack-year smoking history. She has never used smokeless tobacco. She reports that she does not drink alcohol. Her drug history is not on file.  Allergies:  Allergies  Allergen Reactions  . Sulfa Antibiotics Rash    Medications: aspirin EC 81 MG tablet (Taking) Sig - Route: Take 81 mg by mouth daily. - Oral Class: Historical Med Number of times this order has been changed since signing: 1 Order [address] clonazePAM (KLONOPIN) 0.5 MG tablet (Taking) Sig - Route: Take 1 mg by mouth at bedtime. - Oral Class: Historical Med Number of times this order has been changed since signing: 2 Order [address] cyclobenzaprine (FLEXERIL) 5 MG tablet (Taking) Sig - Route: Take 5 mg by mouth at bedtime as needed for muscle spasms. - Oral Class: Historical Med doxepin (SINEQUAN) 25 MG capsule (Taking) 90 capsule 3 06/01/2012 Sig - Route: Take 1 capsule (25 mg total) by mouth at bedtime. - Oral Number of times this order has been changed since signing: 1 Order [address]  insulin glargine (LANTUS) 100 UNIT/ML injection (Taking) Sig - Route: Inject 42 Units into the skin daily. - Subcutaneous Class: Historical Med Number of times this order has been changed since signing: 1 Order [address] levothyroxine (SYNTHROID, LEVOTHROID) 112 MCG tablet (Taking) 03/02/2012 03/02/2013 Sig - Route: Take 112 mcg by mouth daily. - Oral Class: Historical Med lipase/protease/amylase (CREON-10/PANCREASE) 12000 UNITS CPEP (Taking) 03/02/2012 Sig - Route: Take 1 capsule by mouth 3 (three) times daily before meals. - Oral Class: Historical Med triamcinolone cream (KENALOG) 0.1 % (Taking) 454 g 1 05/11/2012 Sig - Route: Apply 1 application topically 2 (two) times daily. - Topical Number of times this order has been changed since signing: 1 Order [address] Insulin Pen Needle (B-D ULTRAFINE III SHORT PEN) 31G X 8 MM MISC 100 each 3 12/09/2011 Sig - Route: Inject 100 application into the skin every morning. - Intradermal Number of times this order has been changed since signing    Please HPI for pertinent positives, otherwise complete 10 system ROS negative.  Physical Exam: Pulse 77, temperature 97.6 F (36.4 C), temperature source Oral, resp. rate 18, height 5\' 4"  (1.626 m), weight 151 lb (68.493 kg), SpO2 94.00%. Body mass index is 25.91 kg/(m^2).   General Appearance:  Alert, cooperative, no distress, appears stated age  Head:  Normocephalic, without obvious abnormality, atraumatic  ENT: Unremarkable  Neck: Supple, symmetrical, trachea midline, no adenopathy, thyroid: not enlarged, symmetric, no tenderness/mass/nodules  Lungs:  Clear to auscultation bilaterally, no w/r/r, respirations unlabored without use of accessory muscles.  Chest Wall:  No tenderness or deformity  Heart:  Regular rate and rhythm, S1, S2 normal, no murmur, rub or gallop.   Neurologic: Normal affect, no gross deficits.   Results for orders placed during the hospital encounter of 06/22/12 (from the past 48 hour(s))   APTT     Status: None   Collection Time    06/22/12  8:19 AM      Result Value Range   aPTT 29  24 - 37 seconds  CBC     Status: Abnormal   Collection Time    06/22/12  8:19 AM      Result Value Range   WBC 8.2  4.0 - 10.5 K/uL   RBC 5.40 (*) 3.87 - 5.11 MIL/uL   Hemoglobin 15.7 (*) 12.0 - 15.0 g/dL   HCT 40.9  81.1 - 91.4 %   MCV 83.5  78.0 - 100.0 fL   MCH 29.1  26.0 - 34.0 pg   MCHC 34.8  30.0 - 36.0 g/dL   RDW 78.2  95.6 - 21.3 %   Platelets 255  150 - 400 K/uL  PROTIME-INR     Status: None   Collection Time    06/22/12  8:19 AM      Result Value Range   Prothrombin Time 12.6  11.6 - 15.2 seconds   INR 0.95  0.00 - 1.49  GLUCOSE, CAPILLARY     Status: None   Collection Time    06/22/12  8:24 AM      Result Value Range   Glucose-Capillary 76  70 - 99 mg/dL   No results found.  Assessment/Plan Lung mass with  Lymphadenopathy For US guided biopsy/FNA of cervical LN Discussed procedure, risks complications, use of sedation Labs reviewed, ok Consent signed in chart  Brayton El PA-C 06/22/2012, 9:21 AM

## 2012-06-22 NOTE — Procedures (Signed)
Technically successful US guided biopsy of enlarged left Cx LN.  No immediate complications.

## 2012-06-23 ENCOUNTER — Encounter (INDEPENDENT_AMBULATORY_CARE_PROVIDER_SITE_OTHER): Payer: Medicare Other | Admitting: Ophthalmology

## 2012-06-28 ENCOUNTER — Encounter (INDEPENDENT_AMBULATORY_CARE_PROVIDER_SITE_OTHER): Payer: Medicare Other | Admitting: Ophthalmology

## 2012-06-29 ENCOUNTER — Encounter: Payer: Self-pay | Admitting: Internal Medicine

## 2012-06-29 ENCOUNTER — Telehealth: Payer: Self-pay | Admitting: Internal Medicine

## 2012-06-29 DIAGNOSIS — C3492 Malignant neoplasm of unspecified part of left bronchus or lung: Secondary | ICD-10-CM

## 2012-06-29 NOTE — Telephone Encounter (Signed)
Pt daughter Bonita Quin is requesting BX results. Please advise MW thanks

## 2012-06-29 NOTE — Telephone Encounter (Signed)
discusse dx of small cell ca > needs oncology consult asap (not [facility])

## 2012-06-29 NOTE — Telephone Encounter (Signed)
Referral was sent to [facility]

## 2012-06-30 ENCOUNTER — Telehealth: Payer: Self-pay | Admitting: Internal Medicine

## 2012-06-30 NOTE — Telephone Encounter (Signed)
I spoke with Misty Ball and she has several questions she would like to discuss with MW regarding results. Please advise MW thanks

## 2012-06-30 NOTE — Telephone Encounter (Signed)
Discussed limited prognosis s rx and rec wait til she talks to onc for r/b/a review

## 2012-07-04 ENCOUNTER — Telehealth: Payer: Self-pay | Admitting: Internal Medicine

## 2012-07-04 ENCOUNTER — Telehealth: Payer: Self-pay | Admitting: *Deleted

## 2012-07-04 NOTE — Telephone Encounter (Signed)
Spoke with pt regarding appt for [facility] 07/06/12.  She requested I call daughter Laroy Apple at [phone].  I called and left VM message regarding appt

## 2012-07-04 NOTE — Telephone Encounter (Signed)
C/D 07/04/12 for appt. 07/06/12

## 2012-07-06 ENCOUNTER — Ambulatory Visit (INDEPENDENT_AMBULATORY_CARE_PROVIDER_SITE_OTHER): Payer: Medicare Other | Admitting: Family Medicine

## 2012-07-06 ENCOUNTER — Ambulatory Visit
Admission: RE | Admit: 2012-07-06 | Discharge: 2012-07-06 | Disposition: A | Discharge status: Home / Self Care | Payer: Medicare Other | Source: Ambulatory Visit | Attending: Radiation Oncology | Admitting: Radiation Oncology

## 2012-07-06 ENCOUNTER — Telehealth: Payer: Self-pay | Admitting: *Deleted

## 2012-07-06 ENCOUNTER — Encounter: Payer: Self-pay | Admitting: Family Medicine

## 2012-07-06 ENCOUNTER — Ambulatory Visit (HOSPITAL_BASED_OUTPATIENT_CLINIC_OR_DEPARTMENT_OTHER): Payer: Medicare Other | Admitting: Internal Medicine

## 2012-07-06 ENCOUNTER — Telehealth: Payer: Self-pay | Admitting: Internal Medicine

## 2012-07-06 ENCOUNTER — Encounter: Payer: Self-pay | Admitting: Oncology

## 2012-07-06 ENCOUNTER — Encounter: Payer: Self-pay | Admitting: Specialist

## 2012-07-06 ENCOUNTER — Encounter: Payer: Self-pay | Admitting: *Deleted

## 2012-07-06 ENCOUNTER — Encounter: Payer: Self-pay | Admitting: Internal Medicine

## 2012-07-06 VITALS — BP 145/74 | HR 89 | Temp 97.4°F | Resp 20 | Ht 64.0 in | Wt 146.0 lb

## 2012-07-06 DIAGNOSIS — M549 Dorsalgia, unspecified: Secondary | ICD-10-CM

## 2012-07-06 DIAGNOSIS — C3492 Malignant neoplasm of unspecified part of left bronchus or lung: Secondary | ICD-10-CM

## 2012-07-06 DIAGNOSIS — F419 Anxiety disorder, unspecified: Secondary | ICD-10-CM

## 2012-07-06 DIAGNOSIS — C3490 Malignant neoplasm of unspecified part of unspecified bronchus or lung: Secondary | ICD-10-CM

## 2012-07-06 DIAGNOSIS — E119 Type 2 diabetes mellitus without complications: Secondary | ICD-10-CM

## 2012-07-06 DIAGNOSIS — E039 Hypothyroidism, unspecified: Secondary | ICD-10-CM

## 2012-07-06 DIAGNOSIS — L309 Dermatitis, unspecified: Secondary | ICD-10-CM

## 2012-07-06 MED ORDER — LEVOTHYROXINE SODIUM 112 MCG PO TABS: 112.0000 ug | ORAL_TABLET | Freq: Every day | ORAL | Status: DC

## 2012-07-06 MED ORDER — TRIAMCINOLONE ACETONIDE 0.1 % EX CREA: 1.0000 "application " | TOPICAL_CREAM | Freq: Two times a day (BID) | CUTANEOUS | Status: DC

## 2012-07-06 MED ORDER — CLONAZEPAM 0.5 MG PO TABS: 1.0000 mg | ORAL_TABLET | Freq: Every day | ORAL | Status: DC

## 2012-07-06 MED ORDER — HYDROCODONE-IBUPROFEN 7.5-200 MG PO TABS: 1.0000 | ORAL_TABLET | Freq: Three times a day (TID) | ORAL | Status: DC | PRN

## 2012-07-06 MED ORDER — INSULIN GLARGINE 100 UNIT/ML ~~LOC~~ SOLN: 42.0000 [IU] | Freq: Every day | SUBCUTANEOUS | Status: DC

## 2012-07-06 MED ORDER — PROCHLORPERAZINE MALEATE 10 MG PO TABS: 10.0000 mg | ORAL_TABLET | Freq: Four times a day (QID) | ORAL | Status: DC | PRN

## 2012-07-06 NOTE — Patient Instructions (Signed)
Your recently diagnosed with small cell lung cancer. We discussed treatment options including systemic chemotherapy with carboplatin and etoposide.

## 2012-07-06 NOTE — Progress Notes (Signed)
79 yo woman with recently diagnosed left bronchogenic carcinoma.  Acute pain developed yesterday when lifting right thigh doing exercise in chair.    Objective:  Crying.  Worried about the implications of the cancer Pain with movement of torso or deep breath.  Pain with palpation right lower ribs. No change in strength of legs.  Neg SLR PET scan did not show any vertebral activity.  Heart:  Regular with 1/VI systolic murmur Chest:  Nontender, wheezes left upper chest.  Assessment;  Acute muscle strain in back.  Worrisome carcinoma  Plan:   Oncology appt today Vicoprofen 7.5 q8h prn Eczema - Plan: triamcinolone cream (KENALOG) 0.1 %  Anxiety - Plan: clonazePAM (KLONOPIN) 0.5 MG tablet  Hypothyroid - Plan: levothyroxine (SYNTHROID, LEVOTHROID) 112 MCG tablet  Diabetes - Plan: insulin glargine (LANTUS) 100 UNIT/ML injection  Bronchogenic cancer, left  [place]

## 2012-07-06 NOTE — Progress Notes (Signed)
[facility] [phone]   [phone]  CONSULT NOTE  REFERRING PHYSICIAN: Dr. Sandrea Hughs  REASON FOR CONSULTATION:  79 years old white female diagnosed with lung cancer.   HPI Misty Ball is a 79 y.o. female with past medical history significant for hypertension, diabetes mellitus, hypothyroidism as well as long history of smoking. The patient mentions that in the middle of February 2014 she started complaining of left-sided chest pain as well as cough and shortness of breath. She was seen at the emergency department and started on treatment with antibiotic for questionable pneumonia. She had no improvement in her condition and she was seen by her primary care physician and chest x-ray was performed on 04/18/2012. It showed left upper lobe airspace opacity compatible with the provided diagnosis of pneumonia but followup chest x-ray was recommended for resolution. CT scan of the chest was performed on 04/21/2012 and it showed a solid appearing mass within the left upper lobe extending to the pleura near the apex measuring 4.5 x 5.7 x 7.0 CM most consistent with primary lung carcinoma. There was also evidence of mediastinal extension. Precarinal node was present was a short axis diameter of 23 mm with an enlarging subcarinal node of 21 mm there was also additional small mediastinal nodes. The patient was referred to Dr. Sherene Sires and Video flexible fiberoptic bronchoscopy was performed on 05/10/2012. The final pathology showed solitary fragments of nondiagnostic lung tissue. A PET scan was performed on 05/31/2012 and it showed left upper lobe primary bronchogenic carcinoma with nodal metastasis in the left hilum, mediastinum and bilateral cervical stations. There was equivocal but suspicious hypermetabolism within the right iliac wing and left side of T6. On 06/22/2012 the patient underwent ultrasound guided left supraclavicular lymph node biopsy by interventional  radiology and the final pathology (Accession #: [id number]) was consistent with small cell carcinoma. Immunohistochemistry is performed and the carcinoma is positive with cytokeratin 7, CD56, chromogranin, synaptophysin and thyroid transcription factor-1 (TTF-1). There is patchy positivity with cytokeratin 903 and p63 and the tumor is negative with CDX-2, cytokeratin 20, cytokeratin 5/6 and Napsin-A. 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 The patient was referred to me today for evaluation and recommendation regarding treatment of her condition. She continues to complain of pain in the left side of her chest as well as the lower back. The patient denied having any significant shortness breath but continues to have cough productive of whitish sputum. She lost around 10 pounds in the last few months. She denied having any headache or visual changes. The patient is a widow and has 7 children. 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 She was accompanied today by her daughter Misty Ball and her son Misty Ball. The patient has a history of smoking one half pack per day for around 50 years and quit few days ago. She has no history of alcohol or drug abuse. @SFHPI @  Past Medical History  Diagnosis Date  . Hypertension   . Pancreatitis   . Diabetes mellitus   . Thyroid disease     Past Surgical History  Procedure Laterality Date  . Abdominal hysterectomy    . Cholecystectomy    . Appendectomy    . Video bronchoscopy Bilateral 05/10/2012    Procedure: VIDEO BRONCHOSCOPY WITHOUT FLUORO;  Surgeon: Nyoka Cowden, MD;  Location: [facility];  Service: Endoscopy;  Laterality: Bilateral;    Family History  Problem Relation Age of  Onset  . Diabetes Mother   . Diabetes Daughter   . Diabetes Son   . Diabetes Son     Social History History  Substance Use Topics  . Smoking status: Current Some Day Smoker -- 0.50 packs/day  for 52 years    Types: Cigarettes  . Smokeless tobacco: Never Used  . Alcohol Use: No    Allergies  Allergen Reactions  . Sulfa Antibiotics Rash    Current Outpatient Prescriptions  Medication Sig Dispense Refill  . aspirin EC 81 MG tablet Take 81 mg by mouth daily.      . clonazePAM (KLONOPIN) 0.5 MG tablet Take 2 tablets (1 mg total) by mouth at bedtime.  30 tablet  5  . doxepin (SINEQUAN) 25 MG capsule Take 1 capsule (25 mg total) by mouth at bedtime.  90 capsule  3  . HYDROcodone-ibuprofen (VICOPROFEN) 7.5-200 MG per tablet Take 1 tablet by mouth every 8 (eight) hours as needed for pain.  30 tablet  0  . insulin glargine (LANTUS) 100 UNIT/ML injection Inject 0.42 mLs (42 Units total) into the skin daily.  10 mL  11  . Insulin Pen Needle (B-D ULTRAFINE III SHORT PEN) 31G X 8 MM MISC Inject 100 application into the skin every morning.  100 each  3  . levothyroxine (SYNTHROID, LEVOTHROID) 112 MCG tablet Take 1 tablet (112 mcg total) by mouth daily.  90 tablet  3  . lipase/protease/amylase (CREON-10/PANCREASE) 12000 UNITS CPEP Take 1 capsule by mouth 3 (three) times daily before meals.      . triamcinolone cream (KENALOG) 0.1 % Apply 1 application topically 2 (two) times daily.  454 g  1   No current facility-administered medications for this visit.    Review of Systems  A comprehensive review of systems was negative except for: Constitutional: positive for anorexia, fatigue and weight loss Respiratory: positive for cough and sputum Musculoskeletal: positive for Left chest wall pain.  Physical Exam  RUE:AVWUJ, healthy, no distress, well nourished and well developed SKIN: skin color, texture, turgor are normal HEAD: Normocephalic, No masses, lesions, tenderness or abnormalities EYES: normal, PERRLA EARS: External ears normal OROPHARYNX:no exudate and no erythema  NECK: supple, no adenopathy LYMPH:  no palpable lymphadenopathy, no hepatosplenomegaly BREAST:not examined LUNGS:  clear to auscultation  HEART: regular rate & rhythm and no murmurs ABDOMEN:abdomen soft, non-tender, normal bowel sounds and no masses or organomegaly BACK: Back symmetric, no curvature. EXTREMITIES:no edema, no skin discoloration, no clubbing  NEURO: alert & oriented x 3 with fluent speech, no focal motor/sensory deficits  PERFORMANCE STATUS: ECOG 1  LABORATORY DATA: Lab Results  Component Value Date   WBC 8.2 06/22/2012   HGB 15.7* 06/22/2012   HCT 45.1 06/22/2012   MCV 83.5 06/22/2012   PLT 255 06/22/2012      Chemistry      Component Value Date/Time   NA 135 06/01/2012 1116   K 4.3 06/01/2012 1116   CL 99 06/01/2012 1116   CO2 29 06/01/2012 1116   BUN 10 06/01/2012 1116   CREATININE 0.66 06/01/2012 1116   CREATININE 0.73 03/28/2012 0850      Component Value Date/Time   CALCIUM 9.1 06/01/2012 1116   ALKPHOS 70 06/01/2012 1116   AST 15 06/01/2012 1116   ALT 9 06/01/2012 1116   BILITOT 0.5 06/01/2012 1116       RADIOGRAPHIC STUDIES: US Biopsy  06/22/2012   *RADIOLOGY REPORT*  Indication: History of left upper lobe lung mass with hypermetabolic supraclavicular adenopathy  worrisome for metastatic disease.  ULTRASOUND GUIDED LEFT SUPRACLAVICULAR LYMPH NODE BIOPSY  Comparisons: PET CT - 05/24/2012  Medications: Fentanyl 50 mcg IV; Versed 2 mg IV  Total Moderate Sedation Time: 10 minutes  Complications: None immediate  Findings / Technique:  Informed written consent was obtained from the patient after a discussion of the risks, benefits and alternatives to treatment. Questions regarding the procedure were encouraged and answered. Initial ultrasound scanning demonstrated enlarged bilateral supraclavicular lymph nodes.  The dominant left-sided supraclavicular lymph node (correlating with the lymph nodes seen on preprocedural PET-CT - image 54, series 2) was targeted for biopsy.  An ultrasound image was saved for documentation purposes. The procedure was planned.  A timeout was performed prior to  the initiation of the procedure.  The operative was prepped and draped in the usual sterile fashion, and a sterile drape was applied covering the operative field.  A timeout was performed prior to the initiation of the procedure. Local anesthesia was provided with 1% lidocaine with epinephrine.  Under direct ultrasound guidance, an 18 gauge core needle device was utilized to obtain to obtain 4 core needle biopsied of the dominant left-sided supraclavicular lymph node.  The samples were placed in saline and submitted to pathology.  The needle was removed and hemostasis was achieved with manual compression.  Post procedure scan was negative for significant hematoma.  A dressing was placed.  The patient tolerated the procedure well without immediate postprocedural complication.  Impression:  Technically successful ultrasound guided left supraclavicular lymph node biopsy.   Original Report Authenticated By: Tacey Ruiz, MD    ASSESSMENT: This is a very pleasant 79 years old white female recently diagnosed with extensive stage small cell lung cancer presented with large left lung mass in addition to mediastinal lymphadenopathy as well as bilateral cervical lymph nodes.   PLAN: I have a lengthy discussion with the patient and her family today about her disease stage, prognosis and treatment options. I recommended for the patient to have MRI of the brain to rule out brain metastasis. I will also order repeat CT scan of the chest as a baseline before starting her chemotherapy as well as PET scan was more than 5 weeks ago. I discussed with the patient her treatment options including palliative care and hospice referral versus proceeding with systemic chemotherapy and carboplatin for AUC of 5 on day 1 and etoposide 120 mg/M2 on days 1, 2 and 3 with Neulasta support on day 4.  I discussed with the patient adverse effect of the chemotherapy including but not limited to alopecia, myelosuppression, nausea and vomiting,  peripheral neuropathy, liver or renal dysfunction. The patient would like to proceed with treatment as planned. She is expected to start the first cycle of her treatment on 07/12/2012. The patient will have a chemotherapy education class before starting the first cycle of her treatment. For the left-sided chest wall pain, the patient will be seen by Dr. Mitzi Hansen for consideration of palliative radiotherapy. She would come back for followup visit in 2 weeks for evaluation and management any adverse effect of her chemotherapy. I will call her pharmacy was prescription for Compazine 10 mg by mouth every 6 hours as needed for nausea. The patient was advised to call immediately if she has any concerning symptoms in the interval. I gave the patient and her family the time to ask questions and I answered them completely to their satisfaction. The patient and her family voice understanding of her current disease status and treatment  options. All questions were answered. The patient knows to call the clinic with any problems, questions or concerns. We can certainly see the patient much sooner if necessary.  Thank you so much for allowing me to participate in the care of Misty Ball. I will continue to follow up the patient with you and assist in her care.  I spent 40 minutes counseling the patient face to face. The total time spent in the appointment was 60 minutes.  Zyen Triggs K. 07/06/2012, 3:49 PM

## 2012-07-06 NOTE — Telephone Encounter (Signed)
gv and printed appt sched and avs for pt....pt sched for MRI 6.2.14....cs will contact to sched ct

## 2012-07-06 NOTE — Telephone Encounter (Signed)
Per staff phone call and POF I have schedueld appts.  JMW  

## 2012-07-06 NOTE — Progress Notes (Signed)
I met Mrs. Devore and two of her children today at thoracic clinic.  She has seven children, one of whom is mentally retarded and lives with her.  In response to being told her diagnosis and treatment plan, she said she was scared and was concerned about her child who lives with her.  I told her and her two children about the available services in the Patient and [facility] and encouraged then to let us know how we can help once they absorb the information given to them today.  I provided them with my contact information.

## 2012-07-06 NOTE — Progress Notes (Signed)
   Thoracic Treatment Summary Name: Shaliah Wann Date: 07/06/12 DOB: [birth year] Your Medical Team Medical Oncologist: Dr. Arbutus Ped Radiation Oncologist: Dr. Mitzi Hansen Pulmonologist: Dr. Sherene Sires  Type and Stage of Lung Cancer Small Cell Carcinoma:  Extensive Stage  Pathological Stage:  Clinical stage is based on radiology exams.  Pathological stage will be determined after surgery.  Staging is based on the size of the tumor, involvement of lymph nodes or not, and whether or not the cancer center has spread. Recommendations Recommendations: Repeat CT scan, MRI Brain, chemotherapy class, palliative radiation therapy,  and chemotherapy next week  These recommendations are based on information available as of today's consult.  This is subject to change depending further testing or exams. Next Steps Next Step: 1. [facility] will call with appointments for chemotherapy class, chemotherapy, and radiation therapy 2. Central scheduling will call with appointments for scans  Barriers to Care What do you perceive as a potential barrier that may prevent you from receiving your treatment plan? Family does not perceive any barriers at this time Questions Willette Pa, RN BSN Thoracic Oncology Nurse Navigator at [phone]  Annabelle Harman is a nurse navigator that is available to assist you through your cancer journey.  She can answer your questions and/or provide resources regarding your treatment plan, emotional support, or financial concerns.

## 2012-07-06 NOTE — Progress Notes (Signed)
Put daughter's fmla form on nurse's desk. °

## 2012-07-07 ENCOUNTER — Encounter: Payer: Self-pay | Admitting: *Deleted

## 2012-07-07 ENCOUNTER — Ambulatory Visit (HOSPITAL_COMMUNITY)
Admission: RE | Admit: 2012-07-07 | Discharge: 2012-07-07 | Disposition: A | Discharge status: Home / Self Care | Payer: Medicare Other | Source: Ambulatory Visit | Attending: Internal Medicine | Admitting: Internal Medicine

## 2012-07-07 ENCOUNTER — Telehealth: Payer: Self-pay

## 2012-07-07 ENCOUNTER — Encounter (HOSPITAL_COMMUNITY): Payer: Self-pay

## 2012-07-07 DIAGNOSIS — R599 Enlarged lymph nodes, unspecified: Secondary | ICD-10-CM | POA: Insufficient documentation

## 2012-07-07 DIAGNOSIS — C3410 Malignant neoplasm of upper lobe, unspecified bronchus or lung: Secondary | ICD-10-CM | POA: Insufficient documentation

## 2012-07-07 DIAGNOSIS — I2510 Atherosclerotic heart disease of native coronary artery without angina pectoris: Secondary | ICD-10-CM | POA: Insufficient documentation

## 2012-07-07 DIAGNOSIS — R059 Cough, unspecified: Secondary | ICD-10-CM | POA: Insufficient documentation

## 2012-07-07 DIAGNOSIS — R05 Cough: Secondary | ICD-10-CM | POA: Insufficient documentation

## 2012-07-07 DIAGNOSIS — C3490 Malignant neoplasm of unspecified part of unspecified bronchus or lung: Secondary | ICD-10-CM | POA: Insufficient documentation

## 2012-07-07 HISTORY — DX: Malignant (primary) neoplasm, unspecified: C80.1

## 2012-07-07 MED ORDER — IOHEXOL 300 MG/ML  SOLN
80.0000 mL | Freq: Once | INTRAMUSCULAR | Status: AC | PRN
  Administered 2012-07-07: 80 mL via INTRAVENOUS

## 2012-07-07 MED ORDER — INSULIN GLARGINE 100 UNIT/ML SOLOSTAR PEN: 0.4200 mL | PEN_INJECTOR | Freq: Every day | SUBCUTANEOUS | Status: DC

## 2012-07-07 NOTE — Telephone Encounter (Signed)
Pharm sent request to change Lantus Rx to Solostar instead of vials. I will send in revised Rx.

## 2012-07-07 NOTE — Progress Notes (Signed)
Radiation Oncology         [phone]) [phone] ________________________________  Name: Misty Ball MRN: [id number]  Date: 07/06/2012  DOB: [birth year]  YQ:[id number],BMWU, MD  Nyoka Cowden, MD     REFERRING PHYSICIAN: Nyoka Cowden, MD   DIAGNOSIS: The encounter diagnosis was Malignant neoplasm of upper lobe, bronchus or lung, left.:  Extensive stage small cell lung cancer   HISTORY OF PRESENT ILLNESS::Misty Ball is a 79 y.o. female who is seen for an initial consultation visit. The patient began experiencing some left-sided chest pain as well as a cough and some shortness of breath. She was seen in the emergency department and was started on antibiotics. Chest x-ray at that time showed a masslike opacity within the left upper lobe.  The patient proceeded to undergo a CT scan which revealed a large solid mass within the left upper lobe extending to the pleura apically which was consistent with a primary lung carcinoma with the final extension. The patient proceeded to undergo video flexible fiberoptic bronchoscopy. Pathology showed some solitary fragments of nondiagnostic lung tissue. Further workup has included a PET scan on 05/24/2012. This showed multiple areas of hypermetabolic activity including a left upper lobe primary bronchogenic carcinoma with nodal metastasis to the left hilum, mediastinum, and bilateral cervical lymph node regions. There is also suspicious hypermetabolic activity within the right iliac weighing as well as the left side of the T6 vertebral body.  The patient then proceeded with an ultrasound guided biopsy of a left supraclavicular lymph node. Pathology from this specimen returned positive for small cell lung cancer. The patient's case has been discussed at multidisciplinary thoracic conference this morning and she is seen in clinic today for evaluation for possible radiotherapy. She is also being seen by medical oncology.     PREVIOUS RADIATION THERAPY:  No   PAST MEDICAL HISTORY:  has a past medical history of Hypertension; Pancreatitis; Diabetes mellitus; and Thyroid disease.     PAST SURGICAL HISTORY: Past Surgical History  Procedure Laterality Date  . Abdominal hysterectomy    . Cholecystectomy    . Appendectomy    . Video bronchoscopy Bilateral 05/10/2012    Procedure: VIDEO BRONCHOSCOPY WITHOUT FLUORO;  Surgeon: Nyoka Cowden, MD;  Location: [facility];  Service: Endoscopy;  Laterality: Bilateral;     FAMILY HISTORY: family history includes Diabetes in her daughter, mother, and sons.   SOCIAL HISTORY:  reports that she has been smoking Cigarettes.  She has a 26 pack-year smoking history. She has never used smokeless tobacco. She reports that she does not drink alcohol.   ALLERGIES: Sulfa antibiotics   MEDICATIONS:  Current Outpatient Prescriptions  Medication Sig Dispense Refill  . aspirin EC 81 MG tablet Take 81 mg by mouth daily.      . clonazePAM (KLONOPIN) 0.5 MG tablet Take 2 tablets (1 mg total) by mouth at bedtime.  30 tablet  5  . doxepin (SINEQUAN) 25 MG capsule Take 1 capsule (25 mg total) by mouth at bedtime.  90 capsule  3  . HYDROcodone-ibuprofen (VICOPROFEN) 7.5-200 MG per tablet Take 1 tablet by mouth every 8 (eight) hours as needed for pain.  30 tablet  0  . insulin glargine (LANTUS) 100 UNIT/ML injection Inject 0.42 mLs (42 Units total) into the skin daily.  10 mL  11  . Insulin Pen Needle (B-D ULTRAFINE III SHORT PEN) 31G X 8 MM MISC Inject 100 application into the skin every morning.  100 each  3  .  levothyroxine (SYNTHROID, LEVOTHROID) 112 MCG tablet Take 1 tablet (112 mcg total) by mouth daily.  90 tablet  3  . lipase/protease/amylase (CREON-10/PANCREASE) 12000 UNITS CPEP Take 1 capsule by mouth 3 (three) times daily before meals.      . prochlorperazine (COMPAZINE) 10 MG tablet Take 1 tablet (10 mg total) by mouth every 6 (six) hours as needed.  60 tablet  0  . triamcinolone cream (KENALOG) 0.1 %  Apply 1 application topically 2 (two) times daily.  454 g  1   No current facility-administered medications for this encounter.     REVIEW OF SYSTEMS:  A 15 point review of systems is documented in the electronic medical record. This was obtained by the nursing staff. However, I reviewed this with the patient to discuss relevant findings and make appropriate changes.  Pertinent items are noted in HPI.    PHYSICAL EXAM:  vitals were not taken for this visit.  General: Well-developed, in no acute distress HEENT: Normocephalic, atraumatic; oral cavity clear Neck: Supple without any lymphadenopathy Cardiovascular: Regular rate and rhythm Respiratory: Clear to auscultation bilaterally GI: Soft, nontender, normal bowel sounds Extremities: No edema present Neuro: No focal deficits    LABORATORY DATA:  Lab Results  Component Value Date   WBC 8.2 06/22/2012   HGB 15.7* 06/22/2012   HCT 45.1 06/22/2012   MCV 83.5 06/22/2012   PLT 255 06/22/2012   Lab Results  Component Value Date   NA 135 06/01/2012   K 4.3 06/01/2012   CL 99 06/01/2012   CO2 29 06/01/2012   Lab Results  Component Value Date   ALT 9 06/01/2012   AST 15 06/01/2012   ALKPHOS 70 06/01/2012   BILITOT 0.5 06/01/2012      RADIOGRAPHY: US Biopsy  06/22/2012   *RADIOLOGY REPORT*  Indication: History of left upper lobe lung mass with hypermetabolic supraclavicular adenopathy worrisome for metastatic disease.  ULTRASOUND GUIDED LEFT SUPRACLAVICULAR LYMPH NODE BIOPSY  Comparisons: PET CT - 05/24/2012  Medications: Fentanyl 50 mcg IV; Versed 2 mg IV  Total Moderate Sedation Time: 10 minutes  Complications: None immediate  Findings / Technique:  Informed written consent was obtained from the patient after a discussion of the risks, benefits and alternatives to treatment. Questions regarding the procedure were encouraged and answered. Initial ultrasound scanning demonstrated enlarged bilateral supraclavicular lymph nodes.  The dominant  left-sided supraclavicular lymph node (correlating with the lymph nodes seen on preprocedural PET-CT - image 54, series 2) was targeted for biopsy.  An ultrasound image was saved for documentation purposes. The procedure was planned.  A timeout was performed prior to the initiation of the procedure.  The operative was prepped and draped in the usual sterile fashion, and a sterile drape was applied covering the operative field.  A timeout was performed prior to the initiation of the procedure. Local anesthesia was provided with 1% lidocaine with epinephrine.  Under direct ultrasound guidance, an 18 gauge core needle device was utilized to obtain to obtain 4 core needle biopsied of the dominant left-sided supraclavicular lymph node.  The samples were placed in saline and submitted to pathology.  The needle was removed and hemostasis was achieved with manual compression.  Post procedure scan was negative for significant hematoma.  A dressing was placed.  The patient tolerated the procedure well without immediate postprocedural complication.  Impression:  Technically successful ultrasound guided left supraclavicular lymph node biopsy.   Original Report Authenticated By: Tacey Ruiz, MD  IMPRESSION: The patient has a new diagnosis of small cell lung cancer. She has hypermetabolic areas suspicious for distant metastatic disease although this is not definitive. However, she has extensive local/regional disease as well and I think it is reasonable to consider the patient as having extensive disease on this basis alone. Notably, the patient has bilateral supraclavicular hypermetabolic lymphadenopathy. She is having pain at this time and I believe that she is an appropriate candidate to proceed with palliative radiotherapy to the dominant tumor within the left chest.   I discussed this possible treatment plan with the patient. Concurrent chemotherapy would be given and this can be arranged as well.  The patient  and I discussed the rationale for radiotherapy in this setting. We discussed the potential side effects and risks of treatment as well, notably including the issue of possible esophagitis. All of her questions were answered. The patient does wish to proceed with this treatment plan.   PLAN: Simulation will be scheduled as soon as this can be arranged and we will proceed with treatment planning to the chest.        ________________________________   Radene Gunning, MD, PhD

## 2012-07-10 ENCOUNTER — Ambulatory Visit: Payer: Medicare Other

## 2012-07-10 ENCOUNTER — Telehealth: Payer: Self-pay | Admitting: Internal Medicine

## 2012-07-10 ENCOUNTER — Encounter: Payer: Self-pay | Admitting: Internal Medicine

## 2012-07-10 ENCOUNTER — Other Ambulatory Visit: Payer: Self-pay | Admitting: *Deleted

## 2012-07-10 ENCOUNTER — Other Ambulatory Visit: Payer: Medicare Other

## 2012-07-10 NOTE — Telephone Encounter (Signed)
Jennifer from GI called to advised that pt MRI has been r/s to 6.3.14 due to the MRI machine breaking down.Marland KitchenMarland Kitchen

## 2012-07-10 NOTE — Progress Notes (Signed)
Faxed daughter's fmla form to [employer] @ [phone].

## 2012-07-11 ENCOUNTER — Ambulatory Visit
Admission: RE | Admit: 2012-07-11 | Discharge: 2012-07-11 | Disposition: A | Discharge status: Home / Self Care | Payer: Medicare Other | Source: Ambulatory Visit | Attending: Internal Medicine | Admitting: Internal Medicine

## 2012-07-11 MED ORDER — GADOBENATE DIMEGLUMINE 529 MG/ML IV SOLN
13.0000 mL | Freq: Once | INTRAVENOUS | Status: AC | PRN
  Administered 2012-07-11: 13 mL via INTRAVENOUS

## 2012-07-12 ENCOUNTER — Ambulatory Visit
Admission: RE | Admit: 2012-07-12 | Discharge: 2012-07-12 | Disposition: A | Discharge status: Home / Self Care | Payer: Medicare Other | Source: Ambulatory Visit | Attending: Radiation Oncology | Admitting: Radiation Oncology

## 2012-07-12 ENCOUNTER — Telehealth: Payer: Self-pay | Admitting: Internal Medicine

## 2012-07-12 ENCOUNTER — Telehealth: Payer: Self-pay | Admitting: *Deleted

## 2012-07-12 ENCOUNTER — Other Ambulatory Visit (HOSPITAL_BASED_OUTPATIENT_CLINIC_OR_DEPARTMENT_OTHER): Payer: Medicare Other | Admitting: Lab

## 2012-07-12 ENCOUNTER — Ambulatory Visit (HOSPITAL_BASED_OUTPATIENT_CLINIC_OR_DEPARTMENT_OTHER): Payer: Medicare Other

## 2012-07-12 ENCOUNTER — Other Ambulatory Visit: Payer: Self-pay | Admitting: Internal Medicine

## 2012-07-12 ENCOUNTER — Other Ambulatory Visit: Payer: Self-pay | Admitting: *Deleted

## 2012-07-12 DIAGNOSIS — Z79899 Other long term (current) drug therapy: Secondary | ICD-10-CM | POA: Insufficient documentation

## 2012-07-12 DIAGNOSIS — Z7982 Long term (current) use of aspirin: Secondary | ICD-10-CM | POA: Insufficient documentation

## 2012-07-12 DIAGNOSIS — C3490 Malignant neoplasm of unspecified part of unspecified bronchus or lung: Secondary | ICD-10-CM | POA: Insufficient documentation

## 2012-07-12 DIAGNOSIS — C3410 Malignant neoplasm of upper lobe, unspecified bronchus or lung: Secondary | ICD-10-CM

## 2012-07-12 DIAGNOSIS — R059 Cough, unspecified: Secondary | ICD-10-CM | POA: Insufficient documentation

## 2012-07-12 DIAGNOSIS — Z9221 Personal history of antineoplastic chemotherapy: Secondary | ICD-10-CM

## 2012-07-12 DIAGNOSIS — R110 Nausea: Secondary | ICD-10-CM | POA: Insufficient documentation

## 2012-07-12 DIAGNOSIS — Z510 Encounter for antineoplastic radiation therapy: Secondary | ICD-10-CM | POA: Insufficient documentation

## 2012-07-12 DIAGNOSIS — R05 Cough: Secondary | ICD-10-CM | POA: Insufficient documentation

## 2012-07-12 DIAGNOSIS — Z5111 Encounter for antineoplastic chemotherapy: Secondary | ICD-10-CM

## 2012-07-12 HISTORY — DX: Personal history of antineoplastic chemotherapy: Z92.21

## 2012-07-12 LAB — COMPREHENSIVE METABOLIC PANEL (CC13)
AST: 17 U/L (ref 5–34)
Albumin: 2.8 g/dL — ABNORMAL LOW (ref 3.5–5.0)
BUN: 17.5 mg/dL (ref 7.0–26.0)
Calcium: 9.1 mg/dL (ref 8.4–10.4)
Chloride: 102 mEq/L (ref 98–107)
Glucose: 208 mg/dl — ABNORMAL HIGH (ref 70–99)
Potassium: 3.7 mEq/L (ref 3.5–5.1)
Sodium: 137 mEq/L (ref 136–145)
Total Protein: 6.9 g/dL (ref 6.4–8.3)

## 2012-07-12 LAB — CBC WITH DIFFERENTIAL/PLATELET
Basophils Absolute: 0 10*3/uL (ref 0.0–0.1)
HCT: 43.7 % (ref 34.8–46.6)
HGB: 14.9 g/dL (ref 11.6–15.9)
MONO#: 0.5 10*3/uL (ref 0.1–0.9)
NEUT#: 5.6 10*3/uL (ref 1.5–6.5)
NEUT%: 73.3 % (ref 38.4–76.8)
RDW: 14.4 % (ref 11.2–14.5)
WBC: 7.6 10*3/uL (ref 3.9–10.3)
lymph#: 1.5 10*3/uL (ref 0.9–3.3)

## 2012-07-12 MED ORDER — SODIUM CHLORIDE 0.9 % IV SOLN
Freq: Once | INTRAVENOUS | Status: AC
  Administered 2012-07-12: 09:00:00 via INTRAVENOUS

## 2012-07-12 MED ORDER — SODIUM CHLORIDE 0.9 % IV SOLN
430.5000 mg | Freq: Once | INTRAVENOUS | Status: AC
  Administered 2012-07-12: 430 mg via INTRAVENOUS
  Filled 2012-07-12: qty 43

## 2012-07-12 MED ORDER — DEXAMETHASONE SODIUM PHOSPHATE 20 MG/5ML IJ SOLN
20.0000 mg | Freq: Once | INTRAMUSCULAR | Status: AC
  Administered 2012-07-12: 20 mg via INTRAVENOUS

## 2012-07-12 MED ORDER — LIDOCAINE-PRILOCAINE 2.5-2.5 % EX CREA: TOPICAL_CREAM | CUTANEOUS | Status: AC | PRN

## 2012-07-12 MED ORDER — ONDANSETRON 16 MG/50ML IVPB (CHCC)
16.0000 mg | Freq: Once | INTRAVENOUS | Status: AC
  Administered 2012-07-12: 16 mg via INTRAVENOUS

## 2012-07-12 MED ORDER — SODIUM CHLORIDE 0.9 % IV SOLN
120.0000 mg/m2 | Freq: Once | INTRAVENOUS | Status: AC
  Administered 2012-07-12: 200 mg via INTRAVENOUS
  Filled 2012-07-12: qty 10

## 2012-07-12 NOTE — Patient Instructions (Signed)
[facility] Discharge Instructions for Patients Receiving Chemotherapy  Today you received the following chemotherapy agents carboplatin and etoposide.  To help prevent nausea and vomiting after your treatment, we encourage you to take your nausea medication compazine.   If you develop nausea and vomiting that is not controlled by your nausea medication, call the clinic.   BELOW ARE SYMPTOMS THAT SHOULD BE REPORTED IMMEDIATELY:  *FEVER GREATER THAN 100.5 F  *CHILLS WITH OR WITHOUT FEVER  NAUSEA AND VOMITING THAT IS NOT CONTROLLED WITH YOUR NAUSEA MEDICATION  *UNUSUAL SHORTNESS OF BREATH  *UNUSUAL BRUISING OR BLEEDING  TENDERNESS IN MOUTH AND THROAT WITH OR WITHOUT PRESENCE OF ULCERS  *URINARY PROBLEMS  *BOWEL PROBLEMS  UNUSUAL RASH Items with * indicate a potential emergency and should be followed up as soon as possible.  Feel free to call the clinic you have any questions or concerns. The clinic phone number is [phone].

## 2012-07-12 NOTE — Progress Notes (Signed)
Pt is requesting a port.  Per Dr Donnald Garre okay for pt to have port by IR.  SLJ

## 2012-07-12 NOTE — Telephone Encounter (Signed)
Misty Ball and sched port placement for 6.23.14 @ 7:30... she will contact pt.Marland KitchenMarland Kitchen

## 2012-07-12 NOTE — Progress Notes (Signed)
Misty Ball here for a nurse evaluation and CT SIM for left lung cancer.  She denies pain and nausea.  She is alert and oriented to person, place and time.  She is in the wheel chair today.

## 2012-07-12 NOTE — Telephone Encounter (Signed)
erroneous

## 2012-07-12 NOTE — Telephone Encounter (Signed)
Nut appt sched for 6.5.14

## 2012-07-12 NOTE — Progress Notes (Signed)
Patient expressed desire to have PAC placed for treatments.  Will let Dr. Asa Lente nurse/Stephanie Laural Benes RN know. Reviewed with patient need to watch/monitor more diligently her blood sugars due to the decadron she received today.  She is not seeing well, but will get her son who lives with her to help her read the monitor.  She is to call MD for blood sugars higher than 350.  Patient requested  2 copies of her AVS - one for her son and one for her dtr. Dr. Arbutus Ped her to discuss results of MRI with patient. Gave patient written and verbal information on how to use EMLA cream for PAC. Patient to Radiation Oncology via wheelchair.  Pt. Knows to call for any questions.

## 2012-07-13 ENCOUNTER — Ambulatory Visit: Payer: Medicare Other | Admitting: Nutrition

## 2012-07-13 ENCOUNTER — Ambulatory Visit (HOSPITAL_BASED_OUTPATIENT_CLINIC_OR_DEPARTMENT_OTHER): Payer: Medicare Other

## 2012-07-13 DIAGNOSIS — C3410 Malignant neoplasm of upper lobe, unspecified bronchus or lung: Secondary | ICD-10-CM

## 2012-07-13 DIAGNOSIS — Z5111 Encounter for antineoplastic chemotherapy: Secondary | ICD-10-CM

## 2012-07-13 MED ORDER — SODIUM CHLORIDE 0.9 % IV SOLN
Freq: Once | INTRAVENOUS | Status: AC
  Administered 2012-07-13: 16:00:00 via INTRAVENOUS

## 2012-07-13 MED ORDER — ONDANSETRON 8 MG/50ML IVPB (CHCC)
8.0000 mg | Freq: Once | INTRAVENOUS | Status: AC
  Administered 2012-07-13: 8 mg via INTRAVENOUS

## 2012-07-13 MED ORDER — SODIUM CHLORIDE 0.9 % IV SOLN
120.0000 mg/m2 | Freq: Once | INTRAVENOUS | Status: AC
  Administered 2012-07-13: 200 mg via INTRAVENOUS
  Filled 2012-07-13: qty 10

## 2012-07-13 MED ORDER — DEXAMETHASONE SODIUM PHOSPHATE 10 MG/ML IJ SOLN
10.0000 mg | Freq: Once | INTRAMUSCULAR | Status: AC
  Administered 2012-07-13: 10 mg via INTRAVENOUS

## 2012-07-13 NOTE — Progress Notes (Signed)
Patient is a 79 year old female diagnosed with extensive small cell lung cancer. She is a patient of Dr. Shirline Frees.  Past medical history includes hypertension, pancreatitis, diabetes, and thyroid disease.   Medications include Klonopin, Lantus, Synthroid, Creon, and Compazine.  Labs include glucose of 208 and albumin 2.8 on June 4.  Height: 63 inches. Weight: 150.3 pounds. Usual body weight 150 pounds. BMI: 26.63.  Patient reports that she recently fell and she has back pain. She has a poor appetite but forces herself to eat. She appears to consume 3 meals daily. She's not currently drinking any oral nutrition supplements.  Nutrition diagnosis: Food and nutrition related knowledge deficit related to diagnosis of small cell lung cancer as evidenced by no prior need for nutrition related information.  Intervention: I educated patient on the importance of increasing meals and snacks to 5-6 times daily. I've encouraged patient to consume an oral nutrition supplement such as Glucerna or ensure between meals. I provided coupons for patient to take with her today. I have reviewed specific meals and snacks she can incorporate. Teach back method used. I've encouraged patient to try to maintain weight throughout treatment. I've given her my contact information.  Monitoring, evaluation goals: Patient will tolerate adequate calories and protein to minimize weight loss and achieve appropriate glycemic control.  Next visit: Wednesday, June 25, during chemotherapy.

## 2012-07-13 NOTE — Patient Instructions (Addendum)
[facility] Discharge Instructions for Patients Receiving Chemotherapy  Today you received the following chemotherapy agents: VP16  To help prevent nausea and vomiting after your treatment, we encourage you to take your nausea medication:prochlorperazine (COMPAZINE) 10 MG tablet, Take 1 tablet (10 mg total) by mouth every 6 (six) hours as needed.   If you develop nausea and vomiting that is not controlled by your nausea medication, call the clinic.   BELOW ARE SYMPTOMS THAT SHOULD BE REPORTED IMMEDIATELY:  *FEVER GREATER THAN 100.5 F  *CHILLS WITH OR WITHOUT FEVER  NAUSEA AND VOMITING THAT IS NOT CONTROLLED WITH YOUR NAUSEA MEDICATION  *UNUSUAL SHORTNESS OF BREATH  *UNUSUAL BRUISING OR BLEEDING  TENDERNESS IN MOUTH AND THROAT WITH OR WITHOUT PRESENCE OF ULCERS  *URINARY PROBLEMS  *BOWEL PROBLEMS  UNUSUAL RASH Items with * indicate a potential emergency and should be followed up as soon as possible.  Feel free to call the clinic you have any questions or concerns. The clinic phone number is [phone].

## 2012-07-14 ENCOUNTER — Ambulatory Visit (HOSPITAL_BASED_OUTPATIENT_CLINIC_OR_DEPARTMENT_OTHER): Payer: Medicare Other

## 2012-07-14 ENCOUNTER — Encounter: Payer: Self-pay | Admitting: *Deleted

## 2012-07-14 DIAGNOSIS — C3410 Malignant neoplasm of upper lobe, unspecified bronchus or lung: Secondary | ICD-10-CM

## 2012-07-14 DIAGNOSIS — Z5111 Encounter for antineoplastic chemotherapy: Secondary | ICD-10-CM

## 2012-07-14 MED ORDER — SODIUM CHLORIDE 0.9 % IV SOLN
Freq: Once | INTRAVENOUS | Status: AC
  Administered 2012-07-14: 15:00:00 via INTRAVENOUS

## 2012-07-14 MED ORDER — SODIUM CHLORIDE 0.9 % IV SOLN
120.0000 mg/m2 | Freq: Once | INTRAVENOUS | Status: AC
  Administered 2012-07-14: 200 mg via INTRAVENOUS
  Filled 2012-07-14: qty 10

## 2012-07-14 MED ORDER — DEXAMETHASONE SODIUM PHOSPHATE 10 MG/ML IJ SOLN
10.0000 mg | Freq: Once | INTRAMUSCULAR | Status: AC
  Administered 2012-07-14: 10 mg via INTRAVENOUS

## 2012-07-14 MED ORDER — ONDANSETRON 8 MG/50ML IVPB (CHCC)
8.0000 mg | Freq: Once | INTRAVENOUS | Status: AC
  Administered 2012-07-14: 8 mg via INTRAVENOUS

## 2012-07-14 NOTE — Patient Instructions (Addendum)
[facility] Discharge Instructions for Patients Receiving Chemotherapy  Today you received the following chemotherapy agents VP 16  To help prevent nausea and vomiting after your treatment, we encourage you to take your nausea medication as needed   If you develop nausea and vomiting that is not controlled by your nausea medication, call the clinic.   BELOW ARE SYMPTOMS THAT SHOULD BE REPORTED IMMEDIATELY:  *FEVER GREATER THAN 100.5 F  *CHILLS WITH OR WITHOUT FEVER  NAUSEA AND VOMITING THAT IS NOT CONTROLLED WITH YOUR NAUSEA MEDICATION  *UNUSUAL SHORTNESS OF BREATH  *UNUSUAL BRUISING OR BLEEDING  TENDERNESS IN MOUTH AND THROAT WITH OR WITHOUT PRESENCE OF ULCERS  *URINARY PROBLEMS  *BOWEL PROBLEMS  UNUSUAL RASH Items with * indicate a potential emergency and should be followed up as soon as possible.  Feel free to call the clinic you have any questions or concerns. The clinic phone number is [phone].    

## 2012-07-14 NOTE — Progress Notes (Signed)
  Radiation Oncology         (336) [phone] ________________________________  Name: Misty Ball MRN: [id number]  Date: 07/12/2012  DOB: November 05, 1931  SIMULATION AND TREATMENT PLANNING NOTE  DIAGNOSIS:  Lung cancer  NARRATIVE:  The patient was brought to the CT Simulation planning suite.  Identity was confirmed.  All relevant records and images related to the planned course of therapy were reviewed.   Written consent to proceed with treatment was confirmed which was freely given after reviewing the details related to the planned course of therapy had been reviewed with the patient.  Then, the patient was set-up in a stable reproducible supine position for radiation therapy.  The patient's arms were raised above using a wing board device. CT images were obtained.  An isocenter was placed within the chest in relation to the target volume. Surface markings were placed.    The CT images were loaded into the planning software.  Then the target and avoidance structures were contoured.  Treatment planning then occurred.  The radiation prescription was entered and confirmed.  A total of 3 complex treatment devices were fabricated which correspond to the designed customized radiation treatment fields. Each of these customized fields/ complex treatment devices will be used on a daily basis during the radiation course. I have requested a 3D Simulation.  I have requested a DVH of the following structures: target volume, spinal cord, lungs, esophagus.   The patient will undergo daily image guidance to ensure accurate localization of the target, and adequate minimize dose to the normal surrounding structures in close proximity to the target.   PLAN:  The patient will initially receive 37.5 Gy in 15 fractions.  Special treatment procedure The patient will receive chemotherapy during the course of radiation treatment. The patient may experience increased or overlapping toxicity due to this combined-modality  approach and the patient will be monitored for such problems. This may include extra lab work as necessary. This therefore constitutes a special treatment procedure.   ________________________________

## 2012-07-15 ENCOUNTER — Ambulatory Visit (HOSPITAL_BASED_OUTPATIENT_CLINIC_OR_DEPARTMENT_OTHER): Payer: Medicare Other

## 2012-07-15 DIAGNOSIS — C3410 Malignant neoplasm of upper lobe, unspecified bronchus or lung: Secondary | ICD-10-CM

## 2012-07-15 MED ORDER — PEGFILGRASTIM INJECTION 6 MG/0.6ML
6.0000 mg | Freq: Once | SUBCUTANEOUS | Status: AC
  Administered 2012-07-15: 6 mg via SUBCUTANEOUS

## 2012-07-15 NOTE — Patient Instructions (Signed)

## 2012-07-17 ENCOUNTER — Telehealth: Payer: Self-pay | Admitting: *Deleted

## 2012-07-17 NOTE — Telephone Encounter (Signed)
BSC at bedside, but slipped and fell 07/16/12 @ night. No injury except abrasion to buttock/sacral area. Son helped her get up after she yelled out for him. He has since gotten her a walker to use also for balance. Instructed her to keep area clean with soap and water and apply antibiotic ointment twice daily. Call for any fever or s/s of infection. No other pain. No nausea, but has her compazine. Not much appetite, but is eating small amounts and drinking "plenty" of fluids. Reports couple bowel movements, but they were very firm. Suggested the obtain colace bid to soften stools. Suggested she see if her son has walkie-talkies or get a baby monitor so she can reach him easily at night as needed in future.

## 2012-07-19 ENCOUNTER — Other Ambulatory Visit (HOSPITAL_BASED_OUTPATIENT_CLINIC_OR_DEPARTMENT_OTHER): Payer: Medicare Other | Admitting: Lab

## 2012-07-19 ENCOUNTER — Ambulatory Visit (HOSPITAL_BASED_OUTPATIENT_CLINIC_OR_DEPARTMENT_OTHER): Payer: Medicare Other | Admitting: Internal Medicine

## 2012-07-19 ENCOUNTER — Ambulatory Visit
Admission: RE | Admit: 2012-07-19 | Discharge: 2012-07-19 | Disposition: A | Discharge status: Home / Self Care | Payer: Medicare Other | Source: Ambulatory Visit | Attending: Radiation Oncology | Admitting: Radiation Oncology

## 2012-07-19 ENCOUNTER — Ambulatory Visit: Payer: Medicare Other | Admitting: Lab

## 2012-07-19 ENCOUNTER — Encounter: Payer: Self-pay | Admitting: Internal Medicine

## 2012-07-19 ENCOUNTER — Telehealth: Payer: Self-pay | Admitting: Internal Medicine

## 2012-07-19 VITALS — BP 130/64 | HR 94 | Temp 97.4°F | Resp 18 | Ht 63.0 in | Wt 146.8 lb

## 2012-07-19 DIAGNOSIS — R0602 Shortness of breath: Secondary | ICD-10-CM

## 2012-07-19 DIAGNOSIS — C7949 Secondary malignant neoplasm of other parts of nervous system: Secondary | ICD-10-CM

## 2012-07-19 DIAGNOSIS — C3492 Malignant neoplasm of unspecified part of left bronchus or lung: Secondary | ICD-10-CM

## 2012-07-19 DIAGNOSIS — R5381 Other malaise: Secondary | ICD-10-CM

## 2012-07-19 DIAGNOSIS — C3410 Malignant neoplasm of upper lobe, unspecified bronchus or lung: Secondary | ICD-10-CM

## 2012-07-19 DIAGNOSIS — C3490 Malignant neoplasm of unspecified part of unspecified bronchus or lung: Secondary | ICD-10-CM | POA: Insufficient documentation

## 2012-07-19 DIAGNOSIS — D702 Other drug-induced agranulocytosis: Secondary | ICD-10-CM

## 2012-07-19 DIAGNOSIS — C7931 Secondary malignant neoplasm of brain: Secondary | ICD-10-CM

## 2012-07-19 DIAGNOSIS — IMO0001 Reserved for inherently not codable concepts without codable children: Secondary | ICD-10-CM

## 2012-07-19 DIAGNOSIS — R5383 Other fatigue: Secondary | ICD-10-CM

## 2012-07-19 LAB — CBC WITH DIFFERENTIAL/PLATELET
Basophils Absolute: 0 10*3/uL (ref 0.0–0.1)
EOS%: 1.4 % (ref 0.0–7.0)
HGB: 13.1 g/dL (ref 11.6–15.9)
MCH: 28.1 pg (ref 25.1–34.0)
MCV: 82 fL (ref 79.5–101.0)
MONO%: 3.4 % (ref 0.0–14.0)
NEUT#: 0.2 10*3/uL — CL (ref 1.5–6.5)
RBC: 4.66 10*6/uL (ref 3.70–5.45)
RDW: 14.4 % (ref 11.2–14.5)
lymph#: 1.1 10*3/uL (ref 0.9–3.3)
nRBC: 0 % (ref 0–0)

## 2012-07-19 LAB — COMPREHENSIVE METABOLIC PANEL (CC13)
ALT: 11 U/L (ref 0–55)
AST: 15 U/L (ref 5–34)
Albumin: 2.6 g/dL — ABNORMAL LOW (ref 3.5–5.0)
Alkaline Phosphatase: 77 U/L (ref 40–150)
Glucose: 227 mg/dl — ABNORMAL HIGH (ref 70–99)
Potassium: 3.7 mEq/L (ref 3.5–5.1)
Sodium: 131 mEq/L — ABNORMAL LOW (ref 136–145)
Total Bilirubin: 0.36 mg/dL (ref 0.20–1.20)
Total Protein: 6.1 g/dL — ABNORMAL LOW (ref 6.4–8.3)

## 2012-07-19 MED ORDER — OXYCODONE-ACETAMINOPHEN 5-325 MG PO TABS: 1.0000 | ORAL_TABLET | Freq: Four times a day (QID) | ORAL | Status: DC | PRN

## 2012-07-19 MED ORDER — CIPROFLOXACIN HCL 500 MG PO TABS: 500.0000 mg | ORAL_TABLET | Freq: Two times a day (BID) | ORAL | Status: DC

## 2012-07-19 NOTE — Progress Notes (Signed)
[facility] [phone]   [phone]  OFFICE PROGRESS NOTE  [name], MD [address] [place] [address]  DIAGNOSIS: Extensive stage small cell lung cancer diagnosed in May of 2014  PRIOR THERAPY: None  CURRENT THERAPY: Systemic chemotherapy with carboplatin for AUC of 5 on day 1 and etoposide at 120 mg/M2 on days 1, 2 and 3 with Neulasta support on day 4, first dose started on 07/12/2012.  INTERVAL HISTORY: Misty Ball 79 y.o. female returns to the clinic today for followup visit accompanied by [name]. The patient is feeling fine today except for fatigue after the first cycle of her chemotherapy which was started last week. She denied having any significant fever or chills, no nausea or vomiting. She has no significant chest pain but continues to have shortness of breath with exertion, no cough or hemoptysis. The patient fell at home recently and has some mild bruises in the buttock area. MRI of the brain performed recently showed at least 4 small brain metastasis. The decision was to continue her systemic chemotherapy and monitor this closely on the upcoming scan.  MEDICAL HISTORY: Past Medical History  Diagnosis Date  . Hypertension   . Pancreatitis   . Diabetes mellitus   . Thyroid disease   . lung ca dx'd 06/2012    ALLERGIES:  is allergic to sulfa antibiotics.  MEDICATIONS:  Current Outpatient Prescriptions  Medication Sig Dispense Refill  . aspirin EC 81 MG tablet Take 81 mg by mouth daily.      . clonazePAM (KLONOPIN) 0.5 MG tablet Take 2 tablets (1 mg total) by mouth at bedtime.  30 tablet  5  . doxepin (SINEQUAN) 25 MG capsule Take 1 capsule (25 mg total) by mouth at bedtime.  90 capsule  3  . HYDROcodone-ibuprofen (VICOPROFEN) 7.5-200 MG per tablet Take 1 tablet by mouth every 8 (eight) hours as needed for pain.  30 tablet  0  . Insulin Glargine (LANTUS SOLOSTAR) 100 UNIT/ML SOPN Inject 0.42 mLs into  the skin daily.  5 pen  11  . Insulin Pen Needle (B-D ULTRAFINE III SHORT PEN) 31G X 8 MM MISC Inject 100 application into the skin every morning.  100 each  3  . levothyroxine (SYNTHROID, LEVOTHROID) 112 MCG tablet Take 1 tablet (112 mcg total) by mouth daily.  90 tablet  3  . lidocaine-prilocaine (EMLA) cream Apply topically as needed. Apply to port 1 hour before chemotherapy  30 g  0  . lipase/protease/amylase (CREON-10/PANCREASE) 12000 UNITS CPEP Take 1 capsule by mouth 3 (three) times daily before meals.      . prochlorperazine (COMPAZINE) 10 MG tablet Take 1 tablet (10 mg total) by mouth every 6 (six) hours as needed.  60 tablet  0  . triamcinolone cream (KENALOG) 0.1 % Apply 1 application topically 2 (two) times daily.  454 g  1   No current facility-administered medications for this visit.    SURGICAL HISTORY:  Past Surgical History  Procedure Laterality Date  . Abdominal hysterectomy    . Cholecystectomy    . Appendectomy    . Video bronchoscopy Bilateral 05/10/2012    Procedure: VIDEO BRONCHOSCOPY WITHOUT FLUORO;  Surgeon: Nyoka Cowden, MD;  Location: [facility];  Service: Endoscopy;  Laterality: Bilateral;    REVIEW OF SYSTEMS:  A comprehensive review of systems was negative except for: Constitutional: positive for fatigue Respiratory: positive for dyspnea on exertion   PHYSICAL EXAMINATION: General appearance:  alert, cooperative, fatigued and no distress Head: Normocephalic, without obvious abnormality, atraumatic Neck: no adenopathy Lymph nodes: Cervical, supraclavicular, and axillary nodes normal. Resp: clear to auscultation bilaterally Cardio: regular rate and rhythm, S1, S2 normal, no murmur, click, rub or gallop GI: soft, non-tender; bowel sounds normal; no masses,  no organomegaly Extremities: extremities normal, atraumatic, no cyanosis or edema Neurologic: Alert and oriented X 3, normal strength and tone. Normal symmetric reflexes. Normal coordination and  gait  ECOG PERFORMANCE STATUS: 2 - Symptomatic, <50% confined to bed  Blood pressure 130/64, pulse 94, temperature 97.4 F (36.3 C), temperature source Oral, resp. rate 18, height 5\' 3"  (1.6 m), weight 146 lb 12.8 oz (66.588 kg).  LABORATORY DATA: Lab Results  Component Value Date   WBC 1.5* 07/19/2012   HGB 13.1 07/19/2012   HCT 38.2 07/19/2012   MCV 82.0 07/19/2012   PLT 194 07/19/2012      Chemistry      Component Value Date/Time   NA 137 07/12/2012 0758   NA 135 06/01/2012 1116   K 3.7 07/12/2012 0758   K 4.3 06/01/2012 1116   CL 102 07/12/2012 0758   CL 99 06/01/2012 1116   CO2 25 07/12/2012 0758   CO2 29 06/01/2012 1116   BUN 17.5 07/12/2012 0758   BUN 10 06/01/2012 1116   CREATININE 0.8 07/12/2012 0758   CREATININE 0.66 06/01/2012 1116   CREATININE 0.73 03/28/2012 0850      Component Value Date/Time   CALCIUM 9.1 07/12/2012 0758   CALCIUM 9.1 06/01/2012 1116   ALKPHOS 81 07/12/2012 0758   ALKPHOS 70 06/01/2012 1116   AST 17 07/12/2012 0758   AST 15 06/01/2012 1116   ALT 10 07/12/2012 0758   ALT 9 06/01/2012 1116   BILITOT 0.39 07/12/2012 0758   BILITOT 0.5 06/01/2012 1116       RADIOGRAPHIC STUDIES: Ct Chest W Contrast  07/07/2012   *RADIOLOGY REPORT*  Clinical Data: Lung cancer.  Back pain and cough.  CT CHEST WITH CONTRAST  Technique:  Multidetector CT imaging of the chest was performed following the standard protocol during bolus administration of intravenous contrast.  Contrast: 80mL OMNIPAQUE IOHEXOL 300 MG/ML  SOLN  Comparison: Chest CT 04/19/2012.  PET CT 05/24/2012.  Findings: There is supraclavicular adenopathy bilaterally which is mildly progressive compared with the prior chest CT.  A right supraclavicular node measures 1.4 cm short axis on image #4 and a left node 1.5 cm on image #5. There is confluent low density mediastinal and left hilar adenopathy.  A pretracheal node measures 3.0 x 2.2 cm on image 21.  There is a subcarinal nodal mass measuring 3.3 x 2.7 cm.  Left hilar adenopathy  encases the left pulmonary artery and deforms it.  There is occlusion of the left upper lobe bronchus.  The left upper lobe "mass" has enlarged, measuring approximately 7.5 x 7.0 cm on image 16.  Some of this could be secondary to postobstructive pneumonitis.  This abuts the left chest wall superiorly.  No chest wall invasion is identified.  The left lower lobe and right lung are clear aside from mild right middle lobe atelectasis.  There is no pleural or pericardial effusion.  Diffuse coronary artery calcifications are noted.  There are small bilateral adrenal nodules which were not clearly demonstrated on the original study.  These measure 1.7 x 1.0 cm on the right and 1.8 x 1.2 cm on the left.  The liver appears stable. There are no lytic osseous lesions.  However, there is a new mild superior endplate compression fracture at T6 asymmetric to the left.  This does not demonstrate any pathologic features.  IMPRESSION:  1.  Progressive mediastinal, left hilar and bilateral supraclavicular lymphadenopathy.  The lymph nodes are lower in density consistent with central necrosis, possibly related to interval therapy. 2.  Progressive enlargement of large left upper lobe mass and/or adjacent postobstructive pneumonitis.  There is mass effect on the left pulmonary artery and occlusion of the left upper lobe bronchus. 3.  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 4.  No chest wall invasion or spinal involvement is identified to account for the patient's back pain, although there appears to be a mild benign T6 compression fracture.   Original Report Authenticated By: Carey Bullocks, M.D.   Mr Laqueta Jean Wo Contrast  07/11/2012   *RADIOLOGY REPORT*  Clinical Data: 79 year old female with recent diagnosis of left lung cancer, and progression on recent chest CT.  Staging.  MRI HEAD WITHOUT AND WITH CONTRAST  Technique:  Multiplanar, multiecho pulse sequences of the brain and surrounding structures were obtained according to  standard protocol without and with intravenous contrast  Contrast: 13mL MULTIHANCE GADOBENATE DIMEGLUMINE 529 MG/ML IV SOLN  Comparison: Brain MRI without contrast 11/16/2008 and earlier.  Findings: There are multiple small enhancing masses in the cerebellum.  The largest measures 4 mm diameter at the left superior vermis (series 10 image 18).  For small enhancing cerebellar lesions are visible.  There is only trace cerebral edema at the largest lesion. No intracranial mass effect.  No definite intra-axial supratentorial metastasis, although there are 2 faint areas of suspicious postcontrast signal; one in the inferior right basal ganglia (series 12 image 10), and one in the left periatrial white matter (series 11 image 9).  There is a bone lesion in the left parasagittal frontal bone with restricted diffusion and intense postcontrast enhancement (series 4 image 27, series 3 image 15, series 10 image 50).  This is new since 2010. This abuts the dura, but there is no definite associated dural thickening or nodularity.  No other suspicious bony lesion identified.  No restricted diffusion to suggest acute infarction.  Major intracranial vascular flow voids are stable.  Chronic lacunar infarcts in the brainstem.  Chronic patchy cerebral white matter and pontine T2 and FLAIR hyperintensity. Negative pituitary cervicomedullary junction.  No ventriculomegaly. No acute intracranial hemorrhage identified.  Chronic micro hemorrhage right pons.  Chronic postoperative changes to the globes. Visualized paranasal sinuses and mastoids are clear.  Trace layering secretions in the nasopharynx.  Abnormally rounded and enhancing 2 cm right submandibular mass is most suggestive of a pathologic lymph node (series 9 image 18). A right level IIB node also is increasing conspicuity (series 9 image 14).  IMPRESSION: 1.  Positive for small brain metastases.  At least four small metastases in the cerebellum with minimal edema at the largest 4  mm lesion. No intracranial mass effect.  Questionable early supratentorial metastases. 2.  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 3.  Right submandibular 2 cm soft tissue mass highly suspicious for metastatic right level II lymph node.   Original Report Authenticated By: Erskine Speed, M.D.   US Biopsy  06/22/2012   *RADIOLOGY REPORT*  Indication: History of left upper lobe lung mass with hypermetabolic supraclavicular adenopathy worrisome for metastatic disease.  ULTRASOUND GUIDED LEFT SUPRACLAVICULAR LYMPH NODE BIOPSY  Comparisons: PET CT - 05/24/2012  Medications: Fentanyl 50 mcg IV; Versed 2 mg IV  Total Moderate Sedation Time: 10 minutes  Complications: None immediate  Findings / Technique:  Informed written consent was obtained from the patient after a discussion of the risks, benefits and alternatives to treatment. Questions regarding the procedure were encouraged and answered. Initial ultrasound scanning demonstrated enlarged bilateral supraclavicular lymph nodes.  The dominant left-sided supraclavicular lymph node (correlating with the lymph nodes seen on preprocedural PET-CT - image 54, series 2) was targeted for biopsy.  An ultrasound image was saved for documentation purposes. The procedure was planned.  A timeout was performed prior to the initiation of the procedure.  The operative was prepped and draped in the usual sterile fashion, and a sterile drape was applied covering the operative field.  A timeout was performed prior to the initiation of the procedure. Local anesthesia was provided with 1% lidocaine with epinephrine.  Under direct ultrasound guidance, an 18 gauge core needle device was utilized to obtain to obtain 4 core needle biopsied of the dominant left-sided supraclavicular lymph node.  The samples were placed in saline and submitted to pathology.  The needle was removed and hemostasis was achieved with manual compression.  Post procedure scan  was negative for significant hematoma.  A dressing was placed.  The patient tolerated the procedure well without immediate postprocedural complication.  Impression:  Technically successful ultrasound guided left supraclavicular lymph node biopsy.   Original Report Authenticated By: Tacey Ruiz, MD    ASSESSMENT AND PLAN:  1)  Extensive stage small cell lung cancer: Currently undergoing systemic chemotherapy with carboplatin and etoposide status post 1 cycle given last week. We will continue to monitor her blood counts closely. 2) chemotherapy-induced neutropenia: The patient is afebrile. She received Neulasta injection after her chemotherapy. I will continue to monitor her blood counts closely. She was given prescription for Cipro 500 mg by mouth twice a day to be used immediately if she developed any fever or chills and to call immediately for evaluation. 3) pain management, matter in the buttock area after the fall: I started the patient on Percocet 5/325 mg by mouth every 6 hours as needed. 4) followup visit in 2 weeks for reevaluation with the start of cycle #2 of the chemotherapy. The patient was advised to call immediately if she has any concerning symptoms in the interval.  All questions were answered. The patient knows to call the clinic with any problems, questions or concerns. We can certainly see the patient much sooner if necessary.

## 2012-07-19 NOTE — Patient Instructions (Signed)
The patient was given guidance about neutropenic precautions. Followup visit in 2 weeks

## 2012-07-19 NOTE — Progress Notes (Signed)
  Radiation Oncology         [phone] ________________________________  Name: Misty Ball MRN: [id number]  Date: 07/19/2012  DOB: 10/07/1931  Simulation Verification Note  Status: outpatient  NARRATIVE: The patient was brought to the treatment unit and placed in the planned left lung treatment position. The clinical setup was verified. Then port films were obtained and uploaded to the radiation oncology medical record software.  The treatment beams were carefully compared against the planned radiation fields. The position location and shape of the radiation fields was reviewed. They targeted volume of tissue appears to be appropriately covered by the radiation beams. Organs at risk appear to be excluded as planned.  Based on my personal review, I approved the simulation verification. The patient's treatment will proceed as planned.  ------------------------------------------------  Artist Pais Kathrynn Running, M.D.

## 2012-07-20 ENCOUNTER — Encounter: Payer: Self-pay | Admitting: Family Medicine

## 2012-07-20 ENCOUNTER — Ambulatory Visit
Admission: RE | Admit: 2012-07-20 | Discharge: 2012-07-20 | Disposition: A | Discharge status: Home / Self Care | Payer: Medicare Other | Source: Ambulatory Visit | Attending: Radiation Oncology | Admitting: Radiation Oncology

## 2012-07-20 ENCOUNTER — Ambulatory Visit (INDEPENDENT_AMBULATORY_CARE_PROVIDER_SITE_OTHER): Payer: Medicare Other | Admitting: Family Medicine

## 2012-07-20 ENCOUNTER — Ambulatory Visit: Payer: Medicare Other

## 2012-07-20 VITALS — BP 118/80 | HR 91 | Temp 97.9°F | Resp 16 | Ht 63.0 in | Wt 148.0 lb

## 2012-07-20 DIAGNOSIS — C3400 Malignant neoplasm of unspecified main bronchus: Secondary | ICD-10-CM

## 2012-07-20 DIAGNOSIS — C3492 Malignant neoplasm of unspecified part of left bronchus or lung: Secondary | ICD-10-CM

## 2012-07-20 DIAGNOSIS — M25552 Pain in left hip: Secondary | ICD-10-CM

## 2012-07-20 DIAGNOSIS — M25559 Pain in unspecified hip: Secondary | ICD-10-CM

## 2012-07-20 MED ORDER — LIDOCAINE 5 % EX PTCH: 1.0000 | MEDICATED_PATCH | CUTANEOUS | Status: DC

## 2012-07-20 MED ORDER — RADIAPLEXRX EX GEL
Freq: Once | CUTANEOUS | Status: AC
  Administered 2012-07-20: 11:00:00 via TOPICAL

## 2012-07-20 NOTE — Progress Notes (Signed)
Misty Ball here with her husband for post sim education.  She was given the Radiation Therapy and You book and discussed the potential side effects of radiation including fatigue, hair loss, skin changes and throat changes.  She was given Radiaplex gel and was instructed to apply it to her left chest and back after treatment and at bedtime.  She was oriented to the clinic and was educated on PUT day with Dr. Mitzi Hansen on Fridays.  She was advised to contact nursing with any questions or concerns.

## 2012-07-20 NOTE — Progress Notes (Signed)
79 yo woman with lung cancer who quit the cigarettes last week.  She is not having problems with the chemo or radiation.  Expecting pic line June 23d.  Her spirits are better.  Birthday next Monday.  4 days ago patient fell when getting up early Sunday morning.  She has had pain in left buttock since.    Also, patient caught right forearm on edge of freezer which tore the skin.  It seems to be healing now with use of bandaids.  Objective:  NAD.  Seems more upbeat now that treatment has started Chest:  Clear, radiotherapy markers on Heart: reg.  No murmur Right forearm:  1 cm x 1 cm dry skin tear without significant erythema or swelling Left buttock:  Palpable midline subcutaneous hematoma with ecchymosis about halfway along the gluteal cleft.  [facility] reading (PRIMARY) by  Dr. Milus Glazier:  Pelvis  No obvious fx  Assessment:  Contusion.  I spent 30 minutes face-to-face with patient listened to her concerns about having lung cancer and tried to be reassuring. In addition we talked about using a walker at home so that she is more stable and avoid falls. She took off the handle from the freezer so she would not cut herself again.   Plan: Followup monthly until patient is through her chemotherapy and is feeling better. She was congratulated on quitting smoking.  Pain in joint, pelvic region and thigh, left - Plan: DG Pelvis 1-2 Views, lidocaine (LIDODERM) 5 %  Signed, [name], MD  .

## 2012-07-21 ENCOUNTER — Ambulatory Visit
Admission: RE | Admit: 2012-07-21 | Discharge: 2012-07-21 | Disposition: A | Discharge status: Home / Self Care | Payer: Medicare Other | Source: Ambulatory Visit | Attending: Radiation Oncology | Admitting: Radiation Oncology

## 2012-07-21 ENCOUNTER — Encounter: Payer: Self-pay | Admitting: Radiation Oncology

## 2012-07-21 VITALS — BP 118/53 | HR 87 | Temp 98.0°F | Ht 63.0 in | Wt 148.0 lb

## 2012-07-21 DIAGNOSIS — C3492 Malignant neoplasm of unspecified part of left bronchus or lung: Secondary | ICD-10-CM

## 2012-07-21 MED ORDER — PROCHLORPERAZINE MALEATE 10 MG PO TABS: 10.0000 mg | ORAL_TABLET | Freq: Four times a day (QID) | ORAL | Status: DC | PRN

## 2012-07-21 MED ORDER — OXYCODONE-ACETAMINOPHEN 5-325 MG PO TABS: 1.0000 | ORAL_TABLET | Freq: Four times a day (QID) | ORAL | Status: DC | PRN

## 2012-07-21 NOTE — Progress Notes (Signed)
Misty Ball here in a wheel chair for weekly under treat visit.  She has had 2/15 fractions to her left lung.  She denies pain, nausea and shortness of breath.  She does have fatigue.  She does have a cough and is bringing up yellow sputum.  She does need a refill on her percocet and compazine. Her skin is intact.

## 2012-07-21 NOTE — Progress Notes (Signed)
  Radiation Oncology         [phone] ________________________________  Name: Misty Ball MRN: [id number]  Date: 07/21/2012  DOB: February 23, 1931  Weekly Radiation Therapy Management  Current Dose: 5 Gy     Planned Dose:  37.5 Gy  Narrative . . . . . . . . The patient presents for routine under treatment assessment.                                                    [name] here in a wheel chair for weekly under treat visit. She has had 2/15 fractions to her left lung. She denies pain, nausea and shortness of breath. She does have fatigue. She does have a cough and is bringing up yellow sputum. She does need a refill on her percocet and compazine. Her skin is intact.                                 Set-up films were reviewed.                                 The chart was checked. Physical Findings. . .  height is 5\' 3"  (1.6 m) and weight is 148 lb (67.132 kg). Her temperature is 98 F (36.7 C). Her blood pressure is 118/53 and her pulse is 87. Her oxygen saturation is 97%. . Weight essentially stable.  No significant changes. Impression . . . . . . . The patient is tolerating radiation. Plan . . . . . . . . . . . . Continue treatment as planned.  Refilled her percocet and compazine  ________________________________  Artist Pais. Kathrynn Running, M.D.

## 2012-07-24 ENCOUNTER — Ambulatory Visit
Admission: RE | Admit: 2012-07-24 | Discharge: 2012-07-24 | Disposition: A | Discharge status: Home / Self Care | Payer: Medicare Other | Source: Ambulatory Visit | Attending: Radiation Oncology | Admitting: Radiation Oncology

## 2012-07-25 ENCOUNTER — Ambulatory Visit
Admission: RE | Admit: 2012-07-25 | Discharge: 2012-07-25 | Disposition: A | Discharge status: Home / Self Care | Payer: Medicare Other | Source: Ambulatory Visit | Attending: Radiation Oncology | Admitting: Radiation Oncology

## 2012-07-26 ENCOUNTER — Other Ambulatory Visit (HOSPITAL_BASED_OUTPATIENT_CLINIC_OR_DEPARTMENT_OTHER): Payer: Medicare Other | Admitting: Lab

## 2012-07-26 ENCOUNTER — Encounter (HOSPITAL_COMMUNITY): Payer: Self-pay | Admitting: Pharmacy Technician

## 2012-07-26 ENCOUNTER — Ambulatory Visit
Admission: RE | Admit: 2012-07-26 | Discharge: 2012-07-26 | Disposition: A | Discharge status: Home / Self Care | Payer: Medicare Other | Source: Ambulatory Visit | Attending: Radiation Oncology | Admitting: Radiation Oncology

## 2012-07-26 DIAGNOSIS — C3410 Malignant neoplasm of upper lobe, unspecified bronchus or lung: Secondary | ICD-10-CM

## 2012-07-26 LAB — CBC WITH DIFFERENTIAL/PLATELET
EOS%: 0.2 % (ref 0.0–7.0)
Eosinophils Absolute: 0 10*3/uL (ref 0.0–0.5)
LYMPH%: 15.8 % (ref 14.0–49.7)
MCH: 28.3 pg (ref 25.1–34.0)
MCHC: 33.6 g/dL (ref 31.5–36.0)
MCV: 84.2 fL (ref 79.5–101.0)
MONO%: 4.4 % (ref 0.0–14.0)
Platelets: 166 10*3/uL (ref 145–400)
RBC: 4.65 10*6/uL (ref 3.70–5.45)
RDW: 15.1 % — ABNORMAL HIGH (ref 11.2–14.5)

## 2012-07-26 LAB — COMPREHENSIVE METABOLIC PANEL (CC13)
AST: 17 U/L (ref 5–34)
Albumin: 2.6 g/dL — ABNORMAL LOW (ref 3.5–5.0)
Alkaline Phosphatase: 100 U/L (ref 40–150)
Glucose: 210 mg/dl — ABNORMAL HIGH (ref 70–99)
Potassium: 4.4 mEq/L (ref 3.5–5.1)
Sodium: 135 mEq/L — ABNORMAL LOW (ref 136–145)
Total Bilirubin: 0.25 mg/dL (ref 0.20–1.20)
Total Protein: 6.4 g/dL (ref 6.4–8.3)

## 2012-07-27 ENCOUNTER — Ambulatory Visit
Admission: RE | Admit: 2012-07-27 | Discharge: 2012-07-27 | Disposition: A | Discharge status: Home / Self Care | Payer: Medicare Other | Source: Ambulatory Visit | Attending: Radiation Oncology | Admitting: Radiation Oncology

## 2012-07-27 ENCOUNTER — Ambulatory Visit: Payer: Medicare Other | Admitting: Family Medicine

## 2012-07-27 ENCOUNTER — Other Ambulatory Visit: Payer: Self-pay | Admitting: Radiology

## 2012-07-27 ENCOUNTER — Encounter: Payer: Self-pay | Admitting: Radiation Oncology

## 2012-07-27 DIAGNOSIS — C3410 Malignant neoplasm of upper lobe, unspecified bronchus or lung: Secondary | ICD-10-CM | POA: Insufficient documentation

## 2012-07-27 NOTE — Progress Notes (Signed)
Reports occasional productive cough with white sputum. Denies hemoptysis. Denies pain or difficulty swallowing. Denies shortness of breath. Weight stable. Denies skin changes within treatment field. Reports using radiaplex bid as directed.

## 2012-07-27 NOTE — Progress Notes (Signed)
   Department of Radiation Oncology  Phone:  [phone] Fax:        [phone]  Weekly Treatment Note    Name: Misty Ball Date: 07/27/2012 MRN: [id number] DOB: 1931-04-28   Current dose: 15 Gy  Current fraction: 6   MEDICATIONS: Current Outpatient Prescriptions  Medication Sig Dispense Refill  . aspirin EC 81 MG tablet Take 81 mg by mouth daily.      . clonazePAM (KLONOPIN) 0.5 MG tablet Take 2 tablets (1 mg total) by mouth at bedtime.  30 tablet  5  . doxepin (SINEQUAN) 25 MG capsule Take 1 capsule (25 mg total) by mouth at bedtime.  90 capsule  3  . hyaluronate sodium (RADIAPLEXRX) GEL Apply 1 application topically 2 (two) times daily.      [name] HYDROcodone-ibuprofen (VICOPROFEN) 7.5-200 MG per tablet Take 1 tablet by mouth every 8 (eight) hours as needed for pain.  30 tablet  0  . Insulin Glargine (LANTUS SOLOSTAR) 100 UNIT/ML SOPN Inject 0.42 mLs into the skin daily.  5 pen  11  . levothyroxine (SYNTHROID, LEVOTHROID) 112 MCG tablet Take 1 tablet (112 mcg total) by mouth daily.  90 tablet  3  . lidocaine (LIDODERM) 5 % Place 1 patch onto the skin daily. Remove & Discard patch within 12 hours or as directed by MD  30 patch  0  . lidocaine-prilocaine (EMLA) cream Apply topically as needed. Apply to port 1 hour before chemotherapy  30 g  0  . lipase/protease/amylase (CREON-10/PANCREASE) 12000 UNITS CPEP Take 1 capsule by mouth 3 (three) times daily before meals.      [name] oxyCODONE-acetaminophen (PERCOCET/ROXICET) 5-325 MG per tablet Take 1 tablet by mouth every 6 (six) hours as needed for pain.  100 tablet  0  . prochlorperazine (COMPAZINE) 10 MG tablet Take 1 tablet (10 mg total) by mouth every 6 (six) hours as needed.  60 tablet  0  . triamcinolone cream (KENALOG) 0.1 % Apply 1 application topically 2 (two) times daily.  454 g  1   No current facility-administered medications for this encounter.     ALLERGIES: Sulfa antibiotics   LABORATORY DATA:  Lab Results    Component Value Date   WBC 9.9 07/26/2012   HGB 13.1 07/26/2012   HCT 39.2 07/26/2012   MCV 84.2 07/26/2012   PLT 166 07/26/2012   Lab Results  Component Value Date   NA 135* 07/26/2012   K 4.4 07/26/2012   CL 99 07/26/2012   CO2 27 07/26/2012   Lab Results  Component Value Date   ALT 12 07/26/2012   AST 17 07/26/2012   ALKPHOS 100 07/26/2012   BILITOT 0.25 07/26/2012     NARRATIVE: Misty Ball was seen today for weekly treatment management. The chart was checked and the patient's films were reviewed. The patient indicates that she is doing well. Denies any difficulties with shortness of breath or chest discomfort. No esophagitis.  PHYSICAL EXAMINATION: weight is 149 lb 6.4 oz (67.767 kg). Her blood pressure is 125/58 and her pulse is 87. Her respiration is 16 and oxygen saturation is 95%.        ASSESSMENT: The patient is doing satisfactorily with treatment.  PLAN: We will continue with the patient's radiation treatment as planned.

## 2012-07-28 ENCOUNTER — Telehealth: Payer: Self-pay | Admitting: Radiology

## 2012-07-28 ENCOUNTER — Ambulatory Visit: Payer: Medicare Other

## 2012-07-28 MED ORDER — TRAMADOL HCL 50 MG PO TABS: 50.0000 mg | ORAL_TABLET | Freq: Three times a day (TID) | ORAL | Status: DC | PRN

## 2012-07-28 NOTE — Telephone Encounter (Signed)
Thanks I have called pharmacy to advise cancel the lidocaine

## 2012-07-28 NOTE — Telephone Encounter (Signed)
I am prescribing tramadol

## 2012-07-28 NOTE — Telephone Encounter (Signed)
You prescribed lidocaine patch for patient, this is only approved by Medicare for post herpetic neuralgia. Please advise, is there anything else you would like to try for her instead?

## 2012-07-28 NOTE — Addendum Note (Signed)
Addended byCaffie Damme on: 07/28/2012 02:24 PM   Modules accepted: Orders, Medications

## 2012-07-31 ENCOUNTER — Other Ambulatory Visit: Payer: Self-pay | Admitting: Internal Medicine

## 2012-07-31 ENCOUNTER — Ambulatory Visit: Payer: Medicare Other

## 2012-07-31 ENCOUNTER — Ambulatory Visit: Admission: RE | Admit: 2012-07-31 | Payer: Medicare Other | Source: Ambulatory Visit

## 2012-07-31 ENCOUNTER — Encounter (HOSPITAL_COMMUNITY): Payer: Self-pay

## 2012-07-31 ENCOUNTER — Ambulatory Visit (HOSPITAL_COMMUNITY)
Admission: RE | Admit: 2012-07-31 | Discharge: 2012-07-31 | Disposition: A | Discharge status: Home / Self Care | Payer: Medicare Other | Source: Ambulatory Visit | Attending: Internal Medicine | Admitting: Internal Medicine

## 2012-07-31 DIAGNOSIS — Z7982 Long term (current) use of aspirin: Secondary | ICD-10-CM | POA: Insufficient documentation

## 2012-07-31 DIAGNOSIS — C3490 Malignant neoplasm of unspecified part of unspecified bronchus or lung: Secondary | ICD-10-CM | POA: Insufficient documentation

## 2012-07-31 DIAGNOSIS — E119 Type 2 diabetes mellitus without complications: Secondary | ICD-10-CM | POA: Insufficient documentation

## 2012-07-31 DIAGNOSIS — Z794 Long term (current) use of insulin: Secondary | ICD-10-CM | POA: Insufficient documentation

## 2012-07-31 DIAGNOSIS — C801 Malignant (primary) neoplasm, unspecified: Secondary | ICD-10-CM | POA: Insufficient documentation

## 2012-07-31 DIAGNOSIS — I10 Essential (primary) hypertension: Secondary | ICD-10-CM | POA: Insufficient documentation

## 2012-07-31 DIAGNOSIS — Z79899 Other long term (current) drug therapy: Secondary | ICD-10-CM | POA: Insufficient documentation

## 2012-07-31 DIAGNOSIS — Z87891 Personal history of nicotine dependence: Secondary | ICD-10-CM | POA: Insufficient documentation

## 2012-07-31 LAB — CBC WITH DIFFERENTIAL/PLATELET
Basophils Relative: 1 % (ref 0–1)
Eosinophils Relative: 0 % (ref 0–5)
Hemoglobin: 13.8 g/dL (ref 12.0–15.0)
Lymphocytes Relative: 16 % (ref 12–46)
Monocytes Relative: 7 % (ref 3–12)
Neutrophils Relative %: 76 % (ref 43–77)
RBC: 4.92 MIL/uL (ref 3.87–5.11)
WBC: 9.5 10*3/uL (ref 4.0–10.5)

## 2012-07-31 LAB — GLUCOSE, CAPILLARY: Glucose-Capillary: 80 mg/dL (ref 70–99)

## 2012-07-31 LAB — PROTIME-INR: INR: 0.98 (ref 0.00–1.49)

## 2012-07-31 LAB — APTT: aPTT: 31 seconds (ref 24–37)

## 2012-07-31 MED ORDER — FENTANYL CITRATE 0.05 MG/ML IJ SOLN
INTRAMUSCULAR | Status: AC | PRN
  Administered 2012-07-31 (×2): 50 ug via INTRAVENOUS

## 2012-07-31 MED ORDER — HEPARIN SOD (PORK) LOCK FLUSH 100 UNIT/ML IV SOLN
INTRAVENOUS | Status: AC | PRN
  Administered 2012-07-31: 500 [IU]

## 2012-07-31 MED ORDER — FENTANYL CITRATE 0.05 MG/ML IJ SOLN
INTRAMUSCULAR | Status: AC
  Filled 2012-07-31: qty 6

## 2012-07-31 MED ORDER — SODIUM CHLORIDE 0.9 % IV SOLN
INTRAVENOUS | Status: DC
  Administered 2012-07-31: 08:00:00 via INTRAVENOUS

## 2012-07-31 MED ORDER — MIDAZOLAM HCL 2 MG/2ML IJ SOLN
INTRAMUSCULAR | Status: AC | PRN
  Administered 2012-07-31: 0.5 mg via INTRAVENOUS
  Administered 2012-07-31: 1 mg via INTRAVENOUS
  Administered 2012-07-31: 0.5 mg via INTRAVENOUS

## 2012-07-31 MED ORDER — CEFAZOLIN SODIUM-DEXTROSE 2-3 GM-% IV SOLR
2.0000 g | Freq: Once | INTRAVENOUS | Status: AC
  Administered 2012-07-31: 2 g via INTRAVENOUS
  Filled 2012-07-31: qty 50

## 2012-07-31 MED ORDER — MIDAZOLAM HCL 2 MG/2ML IJ SOLN
INTRAMUSCULAR | Status: AC
  Filled 2012-07-31: qty 6

## 2012-07-31 NOTE — Procedures (Signed)
Successful placement of right IJ approach port-a-cath with tip at the superior caval atrial junction. The catheter is ready for immediate use. No immediate post procedural complications. 

## 2012-07-31 NOTE — H&P (Signed)
Misty Ball is an 79 y.o. female.   Chief Complaint: "I'm having a port a cath placed" HPI: Patient with history of metastatic small cell lung carcinoma presents today for port a cath placement for chemotherapy.   Past Medical History  Diagnosis Date  . Hypertension   . Pancreatitis   . Diabetes mellitus   . Thyroid disease   . lung ca dx'd 06/2012    Past Surgical History  Procedure Laterality Date  . Abdominal hysterectomy    . Cholecystectomy    . Appendectomy    . Video bronchoscopy Bilateral 05/10/2012    Procedure: VIDEO BRONCHOSCOPY WITHOUT FLUORO;  Surgeon: Nyoka Cowden, MD;  Location: [facility];  Service: Endoscopy;  Laterality: Bilateral;    Family History  Problem Relation Age of Onset  . Diabetes Mother   . Diabetes Daughter   . Diabetes Son   . Diabetes Son    Social History:  reports that she quit smoking about 3 weeks ago. Her smoking use included Cigarettes. She has a 26 pack-year smoking history. She has never used smokeless tobacco. She reports that she does not drink alcohol. Her drug history is not on file.  Allergies:  Allergies  Allergen Reactions  . Sulfa Antibiotics Rash    Current outpatient prescriptions:aspirin EC 81 MG tablet, Take 81 mg by mouth daily., Disp: , Rfl: ;  clonazePAM (KLONOPIN) 0.5 MG tablet, Take 2 tablets (1 mg total) by mouth at bedtime., Disp: 30 tablet, Rfl: 5;  doxepin (SINEQUAN) 25 MG capsule, Take 1 capsule (25 mg total) by mouth at bedtime., Disp: 90 capsule, Rfl: 3;  hyaluronate sodium (RADIAPLEXRX) GEL, Apply 1 application topically 2 (two) times daily., Disp: , Rfl:  Insulin Glargine (LANTUS SOLOSTAR) 100 UNIT/ML SOPN, Inject 0.42 mLs into the skin daily., Disp: 5 pen, Rfl: 11;  levothyroxine (SYNTHROID, LEVOTHROID) 112 MCG tablet, Take 1 tablet (112 mcg total) by mouth daily., Disp: 90 tablet, Rfl: 3;  lipase/protease/amylase (CREON-10/PANCREASE) 12000 UNITS CPEP, Take 1 capsule by mouth 3 (three) times daily before  meals., Disp: , Rfl:  HYDROcodone-ibuprofen (VICOPROFEN) 7.5-200 MG per tablet, Take 1 tablet by mouth every 8 (eight) hours as needed for pain., Disp: 30 tablet, Rfl: 0;  lidocaine-prilocaine (EMLA) cream, Apply topically as needed. Apply to port 1 hour before chemotherapy, Disp: 30 g, Rfl: 0;  oxyCODONE-acetaminophen (PERCOCET/ROXICET) 5-325 MG per tablet, Take 1 tablet by mouth every 6 (six) hours as needed for pain., Disp: 100 tablet, Rfl: 0 prochlorperazine (COMPAZINE) 10 MG tablet, Take 1 tablet (10 mg total) by mouth every 6 (six) hours as needed., Disp: 60 tablet, Rfl: 0;  traMADol (ULTRAM) 50 MG tablet, Take 1 tablet (50 mg total) by mouth every 8 (eight) hours as needed for pain., Disp: 30 tablet, Rfl: 0;  triamcinolone cream (KENALOG) 0.1 %, Apply 1 application topically 2 (two) times daily., Disp: 454 g, Rfl: 1 Current facility-administered medications:0.9 %  sodium chloride infusion, , Intravenous, Continuous, Brayton El, PA-C, Last Rate: 20 mL/hr at 07/31/12 0745;  ceFAZolin (ANCEF) IVPB 2 g/50 mL premix, 2 g, Intravenous, Once, Brayton El, PA-C   Results for orders placed during the hospital encounter of 07/31/12 (from the past 48 hour(s))  APTT     Status: None   Collection Time    07/31/12  7:45 AM      Result Value Range   aPTT 31  24 - 37 seconds  CBC WITH DIFFERENTIAL     Status: None   Collection Time  07/31/12  7:45 AM      Result Value Range   WBC 9.5  4.0 - 10.5 K/uL   RBC 4.92  3.87 - 5.11 MIL/uL   Hemoglobin 13.8  12.0 - 15.0 g/dL   HCT 29.5  62.1 - 30.8 %   MCV 81.9  78.0 - 100.0 fL   MCH 28.0  26.0 - 34.0 pg   MCHC 34.2  30.0 - 36.0 g/dL   RDW 65.7  84.6 - 96.2 %   Platelets 293  150 - 400 K/uL   Neutrophils Relative % 76  43 - 77 %   Lymphocytes Relative 16  12 - 46 %   Monocytes Relative 7  3 - 12 %   Eosinophils Relative 0  0 - 5 %   Basophils Relative 1  0 - 1 %   Neutro Abs 7.2  1.7 - 7.7 K/uL   Lymphs Abs 1.5  0.7 - 4.0 K/uL   Monocytes  Absolute 0.7  0.1 - 1.0 K/uL   Eosinophils Absolute 0.0  0.0 - 0.7 K/uL   Basophils Absolute 0.1  0.0 - 0.1 K/uL  PROTIME-INR     Status: None   Collection Time    07/31/12  7:45 AM      Result Value Range   Prothrombin Time 12.9  11.6 - 15.2 seconds   INR 0.98  0.00 - 1.49   No results found.  Review of Systems  Constitutional: Negative for fever and chills.  Respiratory: Positive for cough.        Some dyspnea with exertion  Cardiovascular:       Occ left sided chest discomfort  Gastrointestinal: Negative for nausea, vomiting and abdominal pain.  Musculoskeletal: Negative for back pain.  Neurological: Negative for headaches.    Blood pressure 149/68, pulse 85, temperature 97.7 F (36.5 C), temperature source Oral, resp. rate 16, SpO2 96.00%. Physical Exam  Constitutional: She is oriented to person, place, and time. She appears well-developed and well-nourished.  Cardiovascular: Normal rate and regular rhythm.   Respiratory: Effort normal.  Few insp/exp wheezes  GI: Soft. Bowel sounds are normal. There is no tenderness.  Musculoskeletal: Normal range of motion. She exhibits no edema.  Neurological: She is alert and oriented to person, place, and time.     Assessment/Plan Pt with hx of metastatic small cell lung carcinoma. Plan is for port a cath placement today for chemotherapy. Details/risks of procedure d/w pt with her understanding and consent.  ALLRED,D KEVIN 07/31/2012, 8:35 AM

## 2012-08-01 ENCOUNTER — Ambulatory Visit
Admission: RE | Admit: 2012-08-01 | Discharge: 2012-08-01 | Disposition: A | Discharge status: Home / Self Care | Payer: Medicare Other | Source: Ambulatory Visit | Attending: Radiation Oncology | Admitting: Radiation Oncology

## 2012-08-02 ENCOUNTER — Other Ambulatory Visit: Payer: Medicare Other

## 2012-08-02 ENCOUNTER — Other Ambulatory Visit (HOSPITAL_BASED_OUTPATIENT_CLINIC_OR_DEPARTMENT_OTHER): Payer: Medicare Other | Admitting: Lab

## 2012-08-02 ENCOUNTER — Ambulatory Visit: Payer: Medicare Other

## 2012-08-02 ENCOUNTER — Telehealth: Payer: Self-pay | Admitting: Internal Medicine

## 2012-08-02 ENCOUNTER — Telehealth: Payer: Self-pay | Admitting: *Deleted

## 2012-08-02 ENCOUNTER — Ambulatory Visit: Admission: RE | Admit: 2012-08-02 | Payer: Medicare Other | Source: Ambulatory Visit

## 2012-08-02 ENCOUNTER — Ambulatory Visit (HOSPITAL_BASED_OUTPATIENT_CLINIC_OR_DEPARTMENT_OTHER): Payer: Medicare Other | Admitting: Internal Medicine

## 2012-08-02 ENCOUNTER — Encounter: Payer: Self-pay | Admitting: Internal Medicine

## 2012-08-02 ENCOUNTER — Ambulatory Visit (HOSPITAL_BASED_OUTPATIENT_CLINIC_OR_DEPARTMENT_OTHER): Payer: Medicare Other

## 2012-08-02 ENCOUNTER — Ambulatory Visit: Payer: Medicare Other | Admitting: Nutrition

## 2012-08-02 VITALS — BP 139/63 | HR 83 | Temp 98.2°F | Resp 18 | Ht 63.0 in | Wt 146.2 lb

## 2012-08-02 DIAGNOSIS — C3410 Malignant neoplasm of upper lobe, unspecified bronchus or lung: Secondary | ICD-10-CM

## 2012-08-02 DIAGNOSIS — Z5111 Encounter for antineoplastic chemotherapy: Secondary | ICD-10-CM

## 2012-08-02 DIAGNOSIS — C3492 Malignant neoplasm of unspecified part of left bronchus or lung: Secondary | ICD-10-CM

## 2012-08-02 LAB — CBC WITH DIFFERENTIAL/PLATELET
BASO%: 0.5 % (ref 0.0–2.0)
EOS%: 0 % (ref 0.0–7.0)
HCT: 40.6 % (ref 34.8–46.6)
MCH: 28 pg (ref 25.1–34.0)
MCHC: 33.5 g/dL (ref 31.5–36.0)
MCV: 83.7 fL (ref 79.5–101.0)
MONO%: 8.4 % (ref 0.0–14.0)
NEUT%: 71.6 % (ref 38.4–76.8)
RDW: 15.8 % — ABNORMAL HIGH (ref 11.2–14.5)
lymph#: 1.5 10*3/uL (ref 0.9–3.3)

## 2012-08-02 LAB — COMPREHENSIVE METABOLIC PANEL (CC13)
AST: 19 U/L (ref 5–34)
Albumin: 3.1 g/dL — ABNORMAL LOW (ref 3.5–5.0)
Alkaline Phosphatase: 101 U/L (ref 40–150)
BUN: 13.1 mg/dL (ref 7.0–26.0)
Potassium: 4.1 mEq/L (ref 3.5–5.1)
Total Bilirubin: 0.36 mg/dL (ref 0.20–1.20)

## 2012-08-02 MED ORDER — ONDANSETRON 16 MG/50ML IVPB (CHCC)
16.0000 mg | Freq: Once | INTRAVENOUS | Status: AC
  Administered 2012-08-02: 16 mg via INTRAVENOUS

## 2012-08-02 MED ORDER — LIDOCAINE-PRILOCAINE 2.5-2.5 % EX CREA: TOPICAL_CREAM | CUTANEOUS | Status: DC | PRN

## 2012-08-02 MED ORDER — CARBOPLATIN CHEMO INJECTION 600 MG/60ML
430.0000 mg | Freq: Once | INTRAVENOUS | Status: AC
  Administered 2012-08-02: 430 mg via INTRAVENOUS
  Filled 2012-08-02: qty 43

## 2012-08-02 MED ORDER — HEPARIN SOD (PORK) LOCK FLUSH 100 UNIT/ML IV SOLN
500.0000 [IU] | Freq: Once | INTRAVENOUS | Status: AC | PRN
  Administered 2012-08-02: 500 [IU]
  Filled 2012-08-02: qty 5

## 2012-08-02 MED ORDER — SODIUM CHLORIDE 0.9 % IV SOLN
Freq: Once | INTRAVENOUS | Status: AC
  Administered 2012-08-02: 10:00:00 via INTRAVENOUS

## 2012-08-02 MED ORDER — SODIUM CHLORIDE 0.9 % IV SOLN
120.0000 mg/m2 | Freq: Once | INTRAVENOUS | Status: AC
  Administered 2012-08-02: 200 mg via INTRAVENOUS
  Filled 2012-08-02: qty 10

## 2012-08-02 MED ORDER — DEXAMETHASONE SODIUM PHOSPHATE 20 MG/5ML IJ SOLN
20.0000 mg | Freq: Once | INTRAMUSCULAR | Status: AC
  Administered 2012-08-02: 20 mg via INTRAVENOUS

## 2012-08-02 MED ORDER — SODIUM CHLORIDE 0.9 % IJ SOLN
10.0000 mL | INTRAMUSCULAR | Status: DC | PRN
  Administered 2012-08-02: 10 mL
  Filled 2012-08-02: qty 10

## 2012-08-02 NOTE — Progress Notes (Signed)
Patient reports that she continues to eat what she can.  She still complains of a poor appetite.  She did try to drink boost however, it resulted in diarrhea.  Currently she complains of constipation.  Weight has decreased to 146.2 pounds documented June 25 from 150.3 pounds documented June 4.  Patient reports she continues to use cookies and sweets if her blood sugar feels low.  Nutrition diagnosis: Food and nutrition related knowledge deficit continues.  Intervention: Patient was educated to attempt to increase calories and protein throughout the day.  Patient was educated that if she is to try another oral nutrition supplement, she drink it slowly and only 4 ounces and evaluate response.  Patient was also educated on appropriate snacks between meals and encouraged to eat every 2-3 hours so her blood sugar does not drop too low.  Teach back method used.  Monitoring, evaluation, goals: Patient has been unable to tolerate increased calories and protein, and has had continued weight loss.  Next visit: Thursday, July 17, during chemotherapy.

## 2012-08-02 NOTE — Telephone Encounter (Signed)
gave pt appt for lab, MD and chemo for june and July, also gave pt oral contrast for CT on July 2014

## 2012-08-02 NOTE — Progress Notes (Signed)
[facility] [phone]   [phone]  OFFICE PROGRESS NOTE  Misty Sidle, MD [address] [place] [address]  DIAGNOSIS: Extensive stage small cell lung cancer diagnosed in May of 2014   PRIOR THERAPY: None   CURRENT THERAPY: Systemic chemotherapy with carboplatin for AUC of 5 on day 1 and etoposide at 120 mg/M2 on days 1, 2 and 3 with Neulasta support on day 4, first dose started on 07/12/2012. Status post 1 cycle.   INTERVAL HISTORY: Misty Ball 79 y.o. female returns to the clinic today for followup visit accompanied by her daughter. The patient is feeling fine today with no specific complaints. She tolerated the first cycle of her systemic chemotherapy fairly well except for neutropenia which recovered today. She denied having any significant fever or chills. She has no nausea or vomiting. The patient denied having any significant chest pain but continues to have shortness breath with exertion and mild cough with no hemoptysis. She had a Port-A-Cath placed 2 days ago and she is here today to resume her systemic chemotherapy was carboplatin and etoposide.  MEDICAL HISTORY: Past Medical History  Diagnosis Date  . Hypertension   . Pancreatitis   . Diabetes mellitus   . Thyroid disease   . lung ca dx'd 06/2012    ALLERGIES:  is allergic to sulfa antibiotics.  MEDICATIONS:  Current Outpatient Prescriptions  Medication Sig Dispense Refill  . aspirin EC 81 MG tablet Take 81 mg by mouth daily.      . clonazePAM (KLONOPIN) 0.5 MG tablet Take 2 tablets (1 mg total) by mouth at bedtime.  30 tablet  5  . doxepin (SINEQUAN) 25 MG capsule Take 1 capsule (25 mg total) by mouth at bedtime.  90 capsule  3  . hyaluronate sodium (RADIAPLEXRX) GEL Apply 1 application topically 2 (two) times daily.      [name] HYDROcodone-ibuprofen (VICOPROFEN) 7.5-200 MG per tablet Take 1 tablet by mouth every 8 (eight) hours as needed for pain.  30 tablet  0  .  Insulin Glargine (LANTUS SOLOSTAR) 100 UNIT/ML SOPN Inject 0.42 mLs into the skin daily.  5 pen  11  . levothyroxine (SYNTHROID, LEVOTHROID) 112 MCG tablet Take 1 tablet (112 mcg total) by mouth daily.  90 tablet  3  . lidocaine-prilocaine (EMLA) cream Apply topically as needed. Apply to port 1 hour before chemotherapy  30 g  0  . lipase/protease/amylase (CREON-10/PANCREASE) 12000 UNITS CPEP Take 1 capsule by mouth 3 (three) times daily before meals.      [name] oxyCODONE-acetaminophen (PERCOCET/ROXICET) 5-325 MG per tablet Take 1 tablet by mouth every 6 (six) hours as needed for pain.  100 tablet  0  . prochlorperazine (COMPAZINE) 10 MG tablet Take 1 tablet (10 mg total) by mouth every 6 (six) hours as needed.  60 tablet  0  . traMADol (ULTRAM) 50 MG tablet Take 1 tablet (50 mg total) by mouth every 8 (eight) hours as needed for pain.  30 tablet  0  . triamcinolone cream (KENALOG) 0.1 % Apply 1 application topically 2 (two) times daily.  454 g  1   No current facility-administered medications for this visit.    SURGICAL HISTORY:  Past Surgical History  Procedure Laterality Date  . Abdominal hysterectomy    . Cholecystectomy    . Appendectomy    . Video bronchoscopy Bilateral 05/10/2012    Procedure: VIDEO BRONCHOSCOPY WITHOUT FLUORO;  Surgeon: Nyoka Cowden, MD;  Location: [facility];  Service: Endoscopy;  Laterality: Bilateral;    REVIEW OF SYSTEMS:  A comprehensive review of systems was negative except for: Constitutional: positive for fatigue   PHYSICAL EXAMINATION: General appearance: alert, cooperative and no distress Head: Normocephalic, without obvious abnormality, atraumatic Neck: no adenopathy Lymph nodes: Cervical, supraclavicular, and axillary nodes normal. Resp: clear to auscultation bilaterally Cardio: regular rate and rhythm, S1, S2 normal, no murmur, click, rub or gallop GI: soft, non-tender; bowel sounds normal; no masses,  no organomegaly Extremities: extremities normal,  atraumatic, no cyanosis or edema Neurologic: Alert and oriented X 3, normal strength and tone. Normal symmetric reflexes. Normal coordination and gait  ECOG PERFORMANCE STATUS: 1 - Symptomatic but completely ambulatory  Blood pressure 139/63, pulse 83, temperature 98.2 F (36.8 C), temperature source Oral, resp. rate 18, height 5\' 3"  (1.6 m), weight 146 lb 3.2 oz (66.316 kg).  LABORATORY DATA: Lab Results  Component Value Date   WBC 7.5 08/02/2012   HGB 13.6 08/02/2012   HCT 40.6 08/02/2012   MCV 83.7 08/02/2012   PLT 335 08/02/2012      Chemistry      Component Value Date/Time   NA 135* 07/26/2012 0814   NA 135 06/01/2012 1116   K 4.4 07/26/2012 0814   K 4.3 06/01/2012 1116   CL 99 07/26/2012 0814   CL 99 06/01/2012 1116   CO2 27 07/26/2012 0814   CO2 29 06/01/2012 1116   BUN 10.5 07/26/2012 0814   BUN 10 06/01/2012 1116   CREATININE 0.8 07/26/2012 0814   CREATININE 0.66 06/01/2012 1116   CREATININE 0.73 03/28/2012 0850      Component Value Date/Time   CALCIUM 8.8 07/26/2012 0814   CALCIUM 9.1 06/01/2012 1116   ALKPHOS 100 07/26/2012 0814   ALKPHOS 70 06/01/2012 1116   AST 17 07/26/2012 0814   AST 15 06/01/2012 1116   ALT 12 07/26/2012 0814   ALT 9 06/01/2012 1116   BILITOT 0.25 07/26/2012 0814   BILITOT 0.5 06/01/2012 1116       RADIOGRAPHIC STUDIES: Dg Pelvis 1-2 Views  07/20/2012   *RADIOLOGY REPORT*  Clinical Data: Pelvic pain  PELVIS - 1-2 VIEW  Comparison: None  Findings: Diffuse osseous demineralization. Hip joint spaces appear slightly narrowed bilaterally. SI joints symmetric. Degenerative disc disease changes lumbar spine. Scattered pelvic enthesopathy. No acute fracture, dislocation, or bone destruction. Scattered atherosclerotic calcifications and phleboliths.  IMPRESSION: Minimal degenerative changes of hip joints bilaterally. Degenerative disc disease changes lumbar spine. Osseous demineralization. Extensive atherosclerotic disease.   Original Report Authenticated By: Ulyses Southward, M.D.   Ct Chest W Contrast  07/07/2012   *RADIOLOGY REPORT*  Clinical Data: Lung cancer.  Back pain and cough.  CT CHEST WITH CONTRAST  Technique:  Multidetector CT imaging of the chest was performed following the standard protocol during bolus administration of intravenous contrast.  Contrast: 80mL OMNIPAQUE IOHEXOL 300 MG/ML  SOLN  Comparison: Chest CT 04/19/2012.  PET CT 05/24/2012.  Findings: There is supraclavicular adenopathy bilaterally which is mildly progressive compared with the prior chest CT.  A right supraclavicular node measures 1.4 cm short axis on image #4 and a left node 1.5 cm on image #5. There is confluent low density mediastinal and left hilar adenopathy.  A pretracheal node measures 3.0 x 2.2 cm on image 21.  There is a subcarinal nodal mass measuring 3.3 x 2.7 cm.  Left hilar adenopathy encases the left pulmonary artery and deforms it.  There is occlusion of the left upper  lobe bronchus.  The left upper lobe "mass" has enlarged, measuring approximately 7.5 x 7.0 cm on image 16.  Some of this could be secondary to postobstructive pneumonitis.  This abuts the left chest wall superiorly.  No chest wall invasion is identified.  The left lower lobe and right lung are clear aside from mild right middle lobe atelectasis.  There is no pleural or pericardial effusion.  Diffuse coronary artery calcifications are noted.  There are small bilateral adrenal nodules which were not clearly demonstrated on the original study.  These measure 1.7 x 1.0 cm on the right and 1.8 x 1.2 cm on the left.  The liver appears stable. There are no lytic osseous lesions.  However, there is a new mild superior endplate compression fracture at T6 asymmetric to the left.  This does not demonstrate any pathologic features.  IMPRESSION:  1.  Progressive mediastinal, left hilar and bilateral supraclavicular lymphadenopathy.  The lymph nodes are lower in density consistent with central necrosis, possibly related to  interval therapy. 2.  Progressive enlargement of large left upper lobe mass and/or adjacent postobstructive pneumonitis.  There is mass effect on the left pulmonary artery and occlusion of the left upper lobe bronchus. 3.  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 4.  No chest wall invasion or spinal involvement is identified to account for the patient's back pain, although there appears to be a mild benign T6 compression fracture.   Original Report Authenticated By: Carey Bullocks, M.D.   Mr Laqueta Jean Wo Contrast  07/11/2012   *RADIOLOGY REPORT*  Clinical Data: 79 year old female with recent diagnosis of left lung cancer, and progression on recent chest CT.  Staging.  MRI HEAD WITHOUT AND WITH CONTRAST  Technique:  Multiplanar, multiecho pulse sequences of the brain and surrounding structures were obtained according to standard protocol without and with intravenous contrast  Contrast: 13mL MULTIHANCE GADOBENATE DIMEGLUMINE 529 MG/ML IV SOLN  Comparison: Brain MRI without contrast 11/16/2008 and earlier.  Findings: There are multiple small enhancing masses in the cerebellum.  The largest measures 4 mm diameter at the left superior vermis (series 10 image 18).  For small enhancing cerebellar lesions are visible.  There is only trace cerebral edema at the largest lesion. No intracranial mass effect.  No definite intra-axial supratentorial metastasis, although there are 2 faint areas of suspicious postcontrast signal; one in the inferior right basal ganglia (series 12 image 10), and one in the left periatrial white matter (series 11 image 9).  There is a bone lesion in the left parasagittal frontal bone with restricted diffusion and intense postcontrast enhancement (series 4 image 27, series 3 image 15, series 10 image 50).  This is new since 2010. This abuts the dura, but there is no definite associated dural thickening or nodularity.  No other suspicious bony lesion identified.  No restricted diffusion to suggest  acute infarction.  Major intracranial vascular flow voids are stable.  Chronic lacunar infarcts in the brainstem.  Chronic patchy cerebral white matter and pontine T2 and FLAIR hyperintensity. Negative pituitary cervicomedullary junction.  No ventriculomegaly. No acute intracranial hemorrhage identified.  Chronic micro hemorrhage right pons.  Chronic postoperative changes to the globes. Visualized paranasal sinuses and mastoids are clear.  Trace layering secretions in the nasopharynx.  Abnormally rounded and enhancing 2 cm right submandibular mass is most suggestive of a pathologic lymph node (series 9 image 18). A right level IIB node also is increasing conspicuity (series 9 image 14).  IMPRESSION: 1.  Positive for  small brain metastases.  At least four small metastases in the cerebellum with minimal edema at the largest 4 mm lesion. No intracranial mass effect.  Questionable early supratentorial metastases. 2.  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 3.  Right submandibular 2 cm soft tissue mass highly suspicious for metastatic right level II lymph node.   Original Report Authenticated By: Erskine Speed, M.D.   Ir Fluoro Guide Cv Line Right  07/31/2012   *RADIOLOGY REPORT*  Indication: History of metastatic small cell lung cancer, in need of intravenous access for chemotherapy administration.  IMPLANTED PORT A CATH PLACEMENT WITH ULTRASOUND AND FLUOROSCOPIC GUIDANCE  Comparison: PET CT - 05/24/2012  Sedation: Versed 2 mg IV; Fentanyl 100 mcg IV; Ancef 2 gm IV; IV antibiotic was given in an appropriate time interval prior to skin puncture.  Total Moderate Sedation Time: 25 minutes.  Contrast: None  Fluoroscopy Time: 1 minute, 6 seconds.  Complications: None immediate  Procedure:  The procedure, risks, benefits, and alternatives were explained to the patient.  Questions regarding the procedure were encouraged and answered.  The patient understands and consents to the  procedure.  The right neck and chest were prepped with chlorhexidine in a sterile fashion, and a sterile drape was applied covering the operative field.  Maximum barrier sterile technique with sterile gowns and gloves were used for the procedure.  A timeout was performed prior to the initiation of the procedure.  Local anesthesia was provided with 1% lidocaine with epinephrine.  After creating a small venotomy incision, a micropuncture kit was utilized to access the right internal jugular vein under direct, real-time ultrasound guidance.  Ultrasound image documentation was performed.  The microwire was kinked to measure appropriate catheter length.  A subcutaneous port pocket was then created along the upper chest wall utilizing a combination of sharp and blunt dissection.  The pocket was irrigated with sterile saline.  A single lumen ISP power injectable port was chosen for placement.  The 8 Fr catheter was tunneled from the port pocket site to the venotomy incision.  The port was placed in the pocket.  The external catheter was trimmed to appropriate length.  At the venotomy, an 8 Fr peel-away sheath was placed over a guidewire under fluoroscopic guidance.  The catheter was then placed through the sheath and the sheath was removed.  Final catheter positioning was confirmed and documented with a fluoroscopic spot radiograph.  The port was accessed with a Huber needle, aspirated and flushed with heparinized saline.  The venotomy site was closed with an interrupted 4-0 Vicryl suture. The port pocket incision was closed with interrupted 2-0 Vicryl suture and the skin was opposed with a running subcuticular 4-0 Vicryl suture.  Dermabond and Steri-strips were applied to both incisions.  Dressings were placed.  The patient tolerated the procedure well without immediate post procedural complication.  Findings:  After catheter placement, the tip lies at the superior cavoatrial junction. The catheter aspirates and flushes  normally and is ready for immediate use.  IMPRESSION:  Successful placement of a right internal jugular approach single lumen power injectable Port-A-Cath.  The catheter is ready for immediate use.   Original Report Authenticated By: Tacey Ruiz, MD   Ir US Guide Vasc Access Right  07/31/2012   *RADIOLOGY REPORT*  Indication: History of metastatic small cell lung cancer, in need of intravenous access for chemotherapy administration.  IMPLANTED PORT A CATH PLACEMENT WITH ULTRASOUND AND FLUOROSCOPIC GUIDANCE  Comparison: PET  CT - 05/24/2012  Sedation: Versed 2 mg IV; Fentanyl 100 mcg IV; Ancef 2 gm IV; IV antibiotic was given in an appropriate time interval prior to skin puncture.  Total Moderate Sedation Time: 25 minutes.  Contrast: None  Fluoroscopy Time: 1 minute, 6 seconds.  Complications: None immediate  Procedure:  The procedure, risks, benefits, and alternatives were explained to the patient.  Questions regarding the procedure were encouraged and answered.  The patient understands and consents to the procedure.  The right neck and chest were prepped with chlorhexidine in a sterile fashion, and a sterile drape was applied covering the operative field.  Maximum barrier sterile technique with sterile gowns and gloves were used for the procedure.  A timeout was performed prior to the initiation of the procedure.  Local anesthesia was provided with 1% lidocaine with epinephrine.  After creating a small venotomy incision, a micropuncture kit was utilized to access the right internal jugular vein under direct, real-time ultrasound guidance.  Ultrasound image documentation was performed.  The microwire was kinked to measure appropriate catheter length.  A subcutaneous port pocket was then created along the upper chest wall utilizing a combination of sharp and blunt dissection.  The pocket was irrigated with sterile saline.  A single lumen ISP power injectable port was chosen for placement.  The 8 Fr catheter was  tunneled from the port pocket site to the venotomy incision.  The port was placed in the pocket.  The external catheter was trimmed to appropriate length.  At the venotomy, an 8 Fr peel-away sheath was placed over a guidewire under fluoroscopic guidance.  The catheter was then placed through the sheath and the sheath was removed.  Final catheter positioning was confirmed and documented with a fluoroscopic spot radiograph.  The port was accessed with a Huber needle, aspirated and flushed with heparinized saline.  The venotomy site was closed with an interrupted 4-0 Vicryl suture. The port pocket incision was closed with interrupted 2-0 Vicryl suture and the skin was opposed with a running subcuticular 4-0 Vicryl suture.  Dermabond and Steri-strips were applied to both incisions.  Dressings were placed.  The patient tolerated the procedure well without immediate post procedural complication.  Findings:  After catheter placement, the tip lies at the superior cavoatrial junction. The catheter aspirates and flushes normally and is ready for immediate use.  IMPRESSION:  Successful placement of a right internal jugular approach single lumen power injectable Port-A-Cath.  The catheter is ready for immediate use.   Original Report Authenticated By: Tacey Ruiz, MD    ASSESSMENT AND PLAN: This is a very pleasant 79 years old white female diagnosed with extensive stage small cell lung cancer currently undergoing systemic chemotherapy with carboplatin and etoposide status post 1 cycle. The patient tolerated the first cycle of her systemic chemotherapy fairly well with no significant adverse effect except for neutropenia which is currently resolved. I recommended for her to proceed with cycle #2 today as scheduled. I would see her back for followup visit in 3 weeks with repeat CT scan of the chest, abdomen and pelvis for restaging of her disease. She was advised to call immediately if she has any concerning symptoms in the  interval. I will call her pharmacy with a prescription for EMLA cream to be applied to the Port-A-Cath site before her chemotherapy.  All questions were answered. The patient knows to call the clinic with any problems, questions or concerns. We can certainly see the patient much sooner if  necessary.  I spent 15 minutes counseling the patient face to face. The total time spent in the appointment was 25 minutes.

## 2012-08-02 NOTE — Patient Instructions (Signed)
Your labwork is good today.  Continue chemotherapy today as scheduled.  Followup in 3 weeks with repeat CT scan of the chest, abdomen and pelvis for restaging of her disease.

## 2012-08-02 NOTE — Patient Instructions (Signed)
Cancer Center Discharge Instructions for Patients Receiving Chemotherapy  Today you received the following chemotherapy agents :  Carboplatin,  Etoposide.  To help prevent nausea and vomiting after your treatment, we encourage you to take your nausea medication as instructed by your physician.   If you develop nausea and vomiting that is not controlled by your nausea medication, call the clinic.   BELOW ARE SYMPTOMS THAT SHOULD BE REPORTED IMMEDIATELY:  *FEVER GREATER THAN 100.5 F  *CHILLS WITH OR WITHOUT FEVER  NAUSEA AND VOMITING THAT IS NOT CONTROLLED WITH YOUR NAUSEA MEDICATION  *UNUSUAL SHORTNESS OF BREATH  *UNUSUAL BRUISING OR BLEEDING  TENDERNESS IN MOUTH AND THROAT WITH OR WITHOUT PRESENCE OF ULCERS  *URINARY PROBLEMS  *BOWEL PROBLEMS  UNUSUAL RASH Items with * indicate a potential emergency and should be followed up as soon as possible.  Feel free to call the clinic you have any questions or concerns. The clinic phone number is [phone].    

## 2012-08-02 NOTE — Telephone Encounter (Signed)
Per staff message and POF I have scheduled appts.  JMW  

## 2012-08-03 ENCOUNTER — Ambulatory Visit (HOSPITAL_BASED_OUTPATIENT_CLINIC_OR_DEPARTMENT_OTHER): Payer: Medicare Other

## 2012-08-03 ENCOUNTER — Ambulatory Visit
Admission: RE | Admit: 2012-08-03 | Discharge: 2012-08-03 | Disposition: A | Discharge status: Home / Self Care | Payer: Medicare Other | Source: Ambulatory Visit | Attending: Radiation Oncology | Admitting: Radiation Oncology

## 2012-08-03 DIAGNOSIS — Z5111 Encounter for antineoplastic chemotherapy: Secondary | ICD-10-CM

## 2012-08-03 DIAGNOSIS — C3410 Malignant neoplasm of upper lobe, unspecified bronchus or lung: Secondary | ICD-10-CM

## 2012-08-03 MED ORDER — SODIUM CHLORIDE 0.9 % IV SOLN
120.0000 mg/m2 | Freq: Once | INTRAVENOUS | Status: AC
  Administered 2012-08-03: 200 mg via INTRAVENOUS
  Filled 2012-08-03: qty 10

## 2012-08-03 MED ORDER — SODIUM CHLORIDE 0.9 % IJ SOLN
10.0000 mL | INTRAMUSCULAR | Status: DC | PRN
  Administered 2012-08-03: 10 mL
  Filled 2012-08-03: qty 10

## 2012-08-03 MED ORDER — ONDANSETRON 8 MG/50ML IVPB (CHCC)
8.0000 mg | Freq: Once | INTRAVENOUS | Status: AC
  Administered 2012-08-03: 8 mg via INTRAVENOUS

## 2012-08-03 MED ORDER — DEXAMETHASONE SODIUM PHOSPHATE 10 MG/ML IJ SOLN
10.0000 mg | Freq: Once | INTRAMUSCULAR | Status: AC
  Administered 2012-08-03: 10 mg via INTRAVENOUS

## 2012-08-03 MED ORDER — SODIUM CHLORIDE 0.9 % IV SOLN
Freq: Once | INTRAVENOUS | Status: AC
  Administered 2012-08-03: 11:00:00 via INTRAVENOUS

## 2012-08-03 MED ORDER — HEPARIN SOD (PORK) LOCK FLUSH 100 UNIT/ML IV SOLN
500.0000 [IU] | Freq: Once | INTRAVENOUS | Status: AC | PRN
  Administered 2012-08-03: 500 [IU]
  Filled 2012-08-03: qty 5

## 2012-08-03 NOTE — Patient Instructions (Addendum)
[facility] Discharge Instructions for Patients Receiving Chemotherapy  Today you received the following chemotherapy agents: Etoposide.  To help prevent nausea and vomiting after your treatment, we encourage you to take your nausea medication as prescribed.   If you develop nausea and vomiting that is not controlled by your nausea medication, call the clinic.   BELOW ARE SYMPTOMS THAT SHOULD BE REPORTED IMMEDIATELY:  *FEVER GREATER THAN 100.5 F  *CHILLS WITH OR WITHOUT FEVER  NAUSEA AND VOMITING THAT IS NOT CONTROLLED WITH YOUR NAUSEA MEDICATION  *UNUSUAL SHORTNESS OF BREATH  *UNUSUAL BRUISING OR BLEEDING  TENDERNESS IN MOUTH AND THROAT WITH OR WITHOUT PRESENCE OF ULCERS  *URINARY PROBLEMS  *BOWEL PROBLEMS  UNUSUAL RASH Items with * indicate a potential emergency and should be followed up as soon as possible.  Feel free to call the clinic you have any questions or concerns. The clinic phone number is [phone].    

## 2012-08-03 NOTE — Progress Notes (Signed)
Department of Radiation Oncology  Phone:  [phone] Fax:        [phone]  Weekly Treatment Note    Name: Misty Ball Date: 08/03/2012 MRN: [id number] DOB: Feb 07, 1932   Current dose: 20 Gy  Current fraction: 8   MEDICATIONS: Current Outpatient Prescriptions  Medication Sig Dispense Refill  . aspirin EC 81 MG tablet Take 81 mg by mouth daily.      . clonazePAM (KLONOPIN) 0.5 MG tablet Take 2 tablets (1 mg total) by mouth at bedtime.  30 tablet  5  . doxepin (SINEQUAN) 25 MG capsule Take 1 capsule (25 mg total) by mouth at bedtime.  90 capsule  3  . hyaluronate sodium (RADIAPLEXRX) GEL Apply 1 application topically 2 (two) times daily.      . Insulin Glargine (LANTUS SOLOSTAR) 100 UNIT/ML SOPN Inject 0.42 mLs into the skin daily.  5 pen  11  . lidocaine-prilocaine (EMLA) cream Apply topically as needed. Apply to port 1 hour before chemotherapy  30 g  0  . lidocaine-prilocaine (EMLA) cream Apply topically as needed.  30 g  0  . lipase/protease/amylase (CREON-10/PANCREASE) 12000 UNITS CPEP Take 1 capsule by mouth 3 (three) times daily before meals.      . prochlorperazine (COMPAZINE) 10 MG tablet Take 1 tablet (10 mg total) by mouth every 6 (six) hours as needed.  60 tablet  0  . triamcinolone cream (KENALOG) 0.1 % Apply 1 application topically 2 (two) times daily.  454 g  1  . HYDROcodone-ibuprofen (VICOPROFEN) 7.5-200 MG per tablet Take 1 tablet by mouth every 8 (eight) hours as needed for pain.  30 tablet  0  . levothyroxine (SYNTHROID, LEVOTHROID) 112 MCG tablet Take 1 tablet (112 mcg total) by mouth daily.  90 tablet  3  . oxyCODONE-acetaminophen (PERCOCET/ROXICET) 5-325 MG per tablet Take 1 tablet by mouth every 6 (six) hours as needed for pain.  100 tablet  0  . traMADol (ULTRAM) 50 MG tablet Take 1 tablet (50 mg total) by mouth every 8 (eight) hours as needed for pain.  30 tablet  0   No current facility-administered medications for this encounter.    Facility-Administered Medications Ordered in Other Encounters  Medication Dose Route Frequency Provider Last Rate Last Dose  . etoposide (VEPESID) 200 mg in sodium chloride 0.9 % 500 mL chemo infusion  120 mg/m2 (Order-Specific) Intravenous Once Si Gaul, MD 510 mL/hr at 08/03/12 1211 200 mg at 08/03/12 1211  . heparin lock flush 100 unit/mL  500 Units Intracatheter Once PRN Si Gaul, MD      . sodium chloride 0.9 % injection 10 mL  10 mL Intracatheter PRN Si Gaul, MD         ALLERGIES: Sulfa antibiotics   LABORATORY DATA:  Lab Results  Component Value Date   WBC 7.5 08/02/2012   HGB 13.6 08/02/2012   HCT 40.6 08/02/2012   MCV 83.7 08/02/2012   PLT 335 08/02/2012   Lab Results  Component Value Date   NA 137 08/02/2012   K 4.1 08/02/2012   CL 101 08/02/2012   CO2 27 08/02/2012   Lab Results  Component Value Date   ALT 9 08/02/2012   AST 19 08/02/2012   ALKPHOS 101 08/02/2012   BILITOT 0.36 08/02/2012     NARRATIVE: Misty Ball was seen today for weekly treatment management. The chart was checked and the patient's films were reviewed. The patient is doing well. She is in a wheelchair today accompanied  by her daughter. She denies any chest pain. No worsening shortness of breath.  PHYSICAL EXAMINATION: height is 5\' 3"  (1.6 m) and weight is 148 lb 12.8 oz (67.495 kg). Her temperature is 98.4 F (36.9 C). Her blood pressure is 124/56 and her pulse is 66. Her oxygen saturation is 94%.        ASSESSMENT: The patient is doing satisfactorily with treatment.  PLAN: We will continue with the patient's radiation treatment as planned.

## 2012-08-03 NOTE — Progress Notes (Signed)
Misty Ball here in a wheelchair with her daughter for weekly under treat visit.  She has had 8 fractions to her left lung.  She denies pain.  She does have a cough and is bringing up white sputum.  She denies nausea.  She does have fatigue.  She is going to have a chemotherapy today.  The skin on her left chest is slightly pink.

## 2012-08-04 ENCOUNTER — Ambulatory Visit
Admission: RE | Admit: 2012-08-04 | Discharge: 2012-08-04 | Disposition: A | Discharge status: Home / Self Care | Payer: Medicare Other | Source: Ambulatory Visit | Attending: Radiation Oncology | Admitting: Radiation Oncology

## 2012-08-04 ENCOUNTER — Ambulatory Visit (HOSPITAL_BASED_OUTPATIENT_CLINIC_OR_DEPARTMENT_OTHER): Payer: Medicare Other

## 2012-08-04 DIAGNOSIS — C3410 Malignant neoplasm of upper lobe, unspecified bronchus or lung: Secondary | ICD-10-CM

## 2012-08-04 DIAGNOSIS — Z5111 Encounter for antineoplastic chemotherapy: Secondary | ICD-10-CM

## 2012-08-04 MED ORDER — HEPARIN SOD (PORK) LOCK FLUSH 100 UNIT/ML IV SOLN
500.0000 [IU] | Freq: Once | INTRAVENOUS | Status: AC | PRN
  Administered 2012-08-04: 500 [IU]
  Filled 2012-08-04: qty 5

## 2012-08-04 MED ORDER — ONDANSETRON 8 MG/50ML IVPB (CHCC)
8.0000 mg | Freq: Once | INTRAVENOUS | Status: AC
  Administered 2012-08-04: 8 mg via INTRAVENOUS

## 2012-08-04 MED ORDER — SODIUM CHLORIDE 0.9 % IV SOLN
120.0000 mg/m2 | Freq: Once | INTRAVENOUS | Status: AC
  Administered 2012-08-04: 200 mg via INTRAVENOUS
  Filled 2012-08-04: qty 10

## 2012-08-04 MED ORDER — DEXAMETHASONE SODIUM PHOSPHATE 10 MG/ML IJ SOLN
10.0000 mg | Freq: Once | INTRAMUSCULAR | Status: AC
  Administered 2012-08-04: 10 mg via INTRAVENOUS

## 2012-08-04 MED ORDER — SODIUM CHLORIDE 0.9 % IJ SOLN
10.0000 mL | INTRAMUSCULAR | Status: DC | PRN
  Administered 2012-08-04: 10 mL
  Filled 2012-08-04: qty 10

## 2012-08-04 MED ORDER — SODIUM CHLORIDE 0.9 % IV SOLN
Freq: Once | INTRAVENOUS | Status: AC
  Administered 2012-08-04: 15:00:00 via INTRAVENOUS

## 2012-08-04 NOTE — Patient Instructions (Addendum)
[facility] Discharge Instructions for Patients Receiving Chemotherapy  Today you received the following chemotherapy agents: Etoposide.  To help prevent nausea and vomiting after your treatment, we encourage you to take your nausea medication as prescribed.   If you develop nausea and vomiting that is not controlled by your nausea medication, call the clinic.   BELOW ARE SYMPTOMS THAT SHOULD BE REPORTED IMMEDIATELY:  *FEVER GREATER THAN 100.5 F  *CHILLS WITH OR WITHOUT FEVER  NAUSEA AND VOMITING THAT IS NOT CONTROLLED WITH YOUR NAUSEA MEDICATION  *UNUSUAL SHORTNESS OF BREATH  *UNUSUAL BRUISING OR BLEEDING  TENDERNESS IN MOUTH AND THROAT WITH OR WITHOUT PRESENCE OF ULCERS  *URINARY PROBLEMS  *BOWEL PROBLEMS  UNUSUAL RASH Items with * indicate a potential emergency and should be followed up as soon as possible.  Feel free to call the clinic you have any questions or concerns. The clinic phone number is [phone].    

## 2012-08-05 ENCOUNTER — Ambulatory Visit
Admission: RE | Admit: 2012-08-05 | Discharge: 2012-08-05 | Disposition: A | Discharge status: Home / Self Care | Payer: Medicare Other | Source: Ambulatory Visit | Attending: Radiation Oncology | Admitting: Radiation Oncology

## 2012-08-05 ENCOUNTER — Ambulatory Visit (HOSPITAL_BASED_OUTPATIENT_CLINIC_OR_DEPARTMENT_OTHER): Payer: Medicare Other

## 2012-08-05 DIAGNOSIS — C3410 Malignant neoplasm of upper lobe, unspecified bronchus or lung: Secondary | ICD-10-CM

## 2012-08-05 DIAGNOSIS — Z5189 Encounter for other specified aftercare: Secondary | ICD-10-CM

## 2012-08-05 MED ORDER — PEGFILGRASTIM INJECTION 6 MG/0.6ML
6.0000 mg | Freq: Once | SUBCUTANEOUS | Status: AC
  Administered 2012-08-05: 6 mg via SUBCUTANEOUS

## 2012-08-07 ENCOUNTER — Ambulatory Visit
Admission: RE | Admit: 2012-08-07 | Discharge: 2012-08-07 | Disposition: A | Discharge status: Home / Self Care | Payer: Medicare Other | Source: Ambulatory Visit | Attending: Radiation Oncology | Admitting: Radiation Oncology

## 2012-08-08 ENCOUNTER — Ambulatory Visit
Admission: RE | Admit: 2012-08-08 | Discharge: 2012-08-08 | Disposition: A | Discharge status: Home / Self Care | Payer: Medicare Other | Source: Ambulatory Visit | Attending: Radiation Oncology | Admitting: Radiation Oncology

## 2012-08-09 ENCOUNTER — Other Ambulatory Visit (HOSPITAL_BASED_OUTPATIENT_CLINIC_OR_DEPARTMENT_OTHER): Payer: Medicare Other

## 2012-08-09 ENCOUNTER — Ambulatory Visit: Payer: Medicare Other

## 2012-08-09 ENCOUNTER — Ambulatory Visit
Admission: RE | Admit: 2012-08-09 | Discharge: 2012-08-09 | Disposition: A | Discharge status: Home / Self Care | Payer: Medicare Other | Source: Ambulatory Visit | Attending: Radiation Oncology | Admitting: Radiation Oncology

## 2012-08-09 DIAGNOSIS — C3490 Malignant neoplasm of unspecified part of unspecified bronchus or lung: Secondary | ICD-10-CM

## 2012-08-09 LAB — COMPREHENSIVE METABOLIC PANEL (CC13)
Albumin: 2.9 g/dL — ABNORMAL LOW (ref 3.5–5.0)
BUN: 21.9 mg/dL (ref 7.0–26.0)
CO2: 25 mEq/L (ref 22–29)
Glucose: 251 mg/dl — ABNORMAL HIGH (ref 70–140)
Sodium: 134 mEq/L — ABNORMAL LOW (ref 136–145)
Total Bilirubin: 0.53 mg/dL (ref 0.20–1.20)
Total Protein: 6.5 g/dL (ref 6.4–8.3)

## 2012-08-09 LAB — CBC WITH DIFFERENTIAL/PLATELET
Basophils Absolute: 0 10*3/uL (ref 0.0–0.1)
Eosinophils Absolute: 0 10*3/uL (ref 0.0–0.5)
HCT: 35.4 % (ref 34.8–46.6)
HGB: 12 g/dL (ref 11.6–15.9)
LYMPH%: 18.6 % (ref 14.0–49.7)
MCV: 84.1 fL (ref 79.5–101.0)
MONO#: 0.2 10*3/uL (ref 0.1–0.9)
MONO%: 5.9 % (ref 0.0–14.0)
NEUT#: 2.5 10*3/uL (ref 1.5–6.5)
Platelets: 244 10*3/uL (ref 145–400)
RBC: 4.2 10*6/uL (ref 3.70–5.45)
WBC: 3.4 10*3/uL — ABNORMAL LOW (ref 3.9–10.3)

## 2012-08-10 ENCOUNTER — Ambulatory Visit
Admission: RE | Admit: 2012-08-10 | Discharge: 2012-08-10 | Disposition: A | Discharge status: Home / Self Care | Payer: Medicare Other | Source: Ambulatory Visit | Attending: Radiation Oncology | Admitting: Radiation Oncology

## 2012-08-10 ENCOUNTER — Encounter: Payer: Self-pay | Admitting: Radiation Oncology

## 2012-08-10 ENCOUNTER — Ambulatory Visit: Payer: Medicare Other

## 2012-08-10 NOTE — Progress Notes (Signed)
Department of Radiation Oncology  Phone:  [phone] Fax:        [phone]  Weekly Treatment Note    Name: Misty Ball Date: 08/10/2012 MRN: [id number] DOB: 1931-07-27   Current dose: 35 Gy  Current fraction: 14   MEDICATIONS: Current Outpatient Prescriptions  Medication Sig Dispense Refill  . aspirin EC 81 MG tablet Take 81 mg by mouth daily.      . clonazePAM (KLONOPIN) 0.5 MG tablet Take 2 tablets (1 mg total) by mouth at bedtime.  30 tablet  5  . doxepin (SINEQUAN) 25 MG capsule Take 1 capsule (25 mg total) by mouth at bedtime.  90 capsule  3  . hyaluronate sodium (RADIAPLEXRX) GEL Apply 1 application topically 2 (two) times daily.      [name] HYDROcodone-ibuprofen (VICOPROFEN) 7.5-200 MG per tablet Take 1 tablet by mouth every 8 (eight) hours as needed for pain.  30 tablet  0  . Insulin Glargine (LANTUS SOLOSTAR) 100 UNIT/ML SOPN Inject 0.42 mLs into the skin daily.  5 pen  11  . levothyroxine (SYNTHROID, LEVOTHROID) 112 MCG tablet Take 1 tablet (112 mcg total) by mouth daily.  90 tablet  3  . lidocaine-prilocaine (EMLA) cream Apply topically as needed. Apply to port 1 hour before chemotherapy  30 g  0  . lidocaine-prilocaine (EMLA) cream Apply topically as needed.  30 g  0  . lipase/protease/amylase (CREON-10/PANCREASE) 12000 UNITS CPEP Take 1 capsule by mouth 3 (three) times daily before meals.      [name] oxyCODONE-acetaminophen (PERCOCET/ROXICET) 5-325 MG per tablet Take 1 tablet by mouth every 6 (six) hours as needed for pain.  100 tablet  0  . prochlorperazine (COMPAZINE) 10 MG tablet Take 1 tablet (10 mg total) by mouth every 6 (six) hours as needed.  60 tablet  0  . traMADol (ULTRAM) 50 MG tablet Take 1 tablet (50 mg total) by mouth every 8 (eight) hours as needed for pain.  30 tablet  0  . triamcinolone cream (KENALOG) 0.1 % Apply 1 application topically 2 (two) times daily.  454 g  1   No current facility-administered medications for this encounter.      ALLERGIES: Sulfa antibiotics   LABORATORY DATA:  Lab Results  Component Value Date   WBC 3.4* 08/09/2012   HGB 12.0 08/09/2012   HCT 35.4 08/09/2012   MCV 84.1 08/09/2012   PLT 244 08/09/2012   Lab Results  Component Value Date   NA 134* 08/09/2012   K 4.0 08/09/2012   CL 101 08/02/2012   CO2 25 08/09/2012   Lab Results  Component Value Date   ALT 9 08/09/2012   AST 16 08/09/2012   ALKPHOS 106 08/09/2012   BILITOT 0.53 08/09/2012     NARRATIVE: Misty Ball was seen today for weekly treatment management. The chart was checked and the patient's films were reviewed. The patient states that she is doing fairly well. She had some nausea earlier this week but this has resolved. She does have some medication for this. The patient has some dry skin posteriorly but otherwise is doing well.  PHYSICAL EXAMINATION: weight is 146 lb 4.8 oz (66.361 kg). Her oral temperature is 98 F (36.7 C). Her blood pressure is 108/64 and her pulse is 88. Her respiration is 20 and oxygen saturation is 95%.      some radiation change in the chest posteriorly with erythema  ASSESSMENT: The patient is doing satisfactorily with treatment.  PLAN: We will continue  with the patient's radiation treatment as planned. The patient will begin using some skin cream. I expect for the patient's skin to heal well over the next several weeks. She will finish her final fraction early next week on Monday.

## 2012-08-10 NOTE — Progress Notes (Signed)
Weekly rad txs 14/15 completed, lt lung, room air sats=95%, no c/o pain, appetite fair goes up and down, labs 08/09/12, gave 1 mo appr card , no skin irritation  Erythema  On back, bruising yellow/green where right portacath is healing , steri strip x1 still over sie,, weak and fatigued 10:28 AM

## 2012-08-11 ENCOUNTER — Ambulatory Visit: Payer: Medicare Other

## 2012-08-14 ENCOUNTER — Telehealth: Payer: Self-pay

## 2012-08-14 ENCOUNTER — Encounter: Payer: Self-pay | Admitting: Radiation Oncology

## 2012-08-14 ENCOUNTER — Ambulatory Visit
Admission: RE | Admit: 2012-08-14 | Discharge: 2012-08-14 | Disposition: A | Discharge status: Home / Self Care | Payer: Medicare Other | Source: Ambulatory Visit | Attending: Radiation Oncology | Admitting: Radiation Oncology

## 2012-08-14 NOTE — Telephone Encounter (Signed)
Which rx? Called her and she needs clonazepam and insulin advised her both of these have refills/ please advise when do you want her to come here to see you? Or do want to overbook next door

## 2012-08-14 NOTE — Telephone Encounter (Signed)
Dr. Milus Glazier, Patient would like to see you if at all possible this coming Thursday, which you are completely booked. She also needs a couple of her rx's filled.   Pt's # [id number]

## 2012-08-14 NOTE — Telephone Encounter (Signed)
Please refill meds of clonazepam and insulin for me.

## 2012-08-14 NOTE — Telephone Encounter (Signed)
Did you read my note? The meds are sent, I want you to advise when/ where you will see patient.

## 2012-08-15 NOTE — Telephone Encounter (Signed)
Either this Thursday, or, preferably next Thursday

## 2012-08-15 NOTE — Telephone Encounter (Signed)
I would like to see the patient at 104

## 2012-08-16 ENCOUNTER — Other Ambulatory Visit (HOSPITAL_BASED_OUTPATIENT_CLINIC_OR_DEPARTMENT_OTHER): Payer: Medicare Other | Admitting: Lab

## 2012-08-16 ENCOUNTER — Ambulatory Visit (INDEPENDENT_AMBULATORY_CARE_PROVIDER_SITE_OTHER): Payer: Medicare Other | Admitting: Family Medicine

## 2012-08-16 VITALS — BP 130/62 | HR 93 | Temp 98.2°F | Resp 16 | Ht 63.0 in | Wt 146.8 lb

## 2012-08-16 DIAGNOSIS — L309 Dermatitis, unspecified: Secondary | ICD-10-CM

## 2012-08-16 DIAGNOSIS — E039 Hypothyroidism, unspecified: Secondary | ICD-10-CM

## 2012-08-16 DIAGNOSIS — L738 Other specified follicular disorders: Secondary | ICD-10-CM

## 2012-08-16 DIAGNOSIS — F411 Generalized anxiety disorder: Secondary | ICD-10-CM

## 2012-08-16 DIAGNOSIS — L259 Unspecified contact dermatitis, unspecified cause: Secondary | ICD-10-CM

## 2012-08-16 DIAGNOSIS — L589 Radiodermatitis, unspecified: Secondary | ICD-10-CM

## 2012-08-16 DIAGNOSIS — F419 Anxiety disorder, unspecified: Secondary | ICD-10-CM

## 2012-08-16 DIAGNOSIS — C3410 Malignant neoplasm of upper lobe, unspecified bronchus or lung: Secondary | ICD-10-CM

## 2012-08-16 DIAGNOSIS — L853 Xerosis cutis: Secondary | ICD-10-CM

## 2012-08-16 LAB — COMPREHENSIVE METABOLIC PANEL (CC13)
Albumin: 2.9 g/dL — ABNORMAL LOW (ref 3.5–5.0)
CO2: 26 mEq/L (ref 22–29)
Calcium: 8.7 mg/dL (ref 8.4–10.4)
Chloride: 101 mEq/L (ref 98–109)
Glucose: 310 mg/dl — ABNORMAL HIGH (ref 70–140)
Potassium: 4 mEq/L (ref 3.5–5.1)
Sodium: 134 mEq/L — ABNORMAL LOW (ref 136–145)
Total Protein: 6.2 g/dL — ABNORMAL LOW (ref 6.4–8.3)

## 2012-08-16 LAB — CBC WITH DIFFERENTIAL/PLATELET
Basophils Absolute: 0 10*3/uL (ref 0.0–0.1)
Eosinophils Absolute: 0.1 10*3/uL (ref 0.0–0.5)
HGB: 10.9 g/dL — ABNORMAL LOW (ref 11.6–15.9)
MONO#: 0.5 10*3/uL (ref 0.1–0.9)
NEUT#: 7.8 10*3/uL — ABNORMAL HIGH (ref 1.5–6.5)
Platelets: 79 10*3/uL — ABNORMAL LOW (ref 145–400)
RBC: 3.8 10*6/uL (ref 3.70–5.45)
RDW: 15.2 % — ABNORMAL HIGH (ref 11.2–14.5)
WBC: 9.2 10*3/uL (ref 3.9–10.3)

## 2012-08-16 MED ORDER — DOXEPIN HCL 25 MG PO CAPS: 25.0000 mg | ORAL_CAPSULE | Freq: Every day | ORAL | Status: DC

## 2012-08-16 MED ORDER — LEVOTHYROXINE SODIUM 112 MCG PO TABS: 112.0000 ug | ORAL_TABLET | Freq: Every day | ORAL | Status: DC

## 2012-08-16 MED ORDER — CLONAZEPAM 0.5 MG PO TABS: 1.0000 mg | ORAL_TABLET | Freq: Every day | ORAL | Status: DC

## 2012-08-16 MED ORDER — TRIAMCINOLONE ACETONIDE 0.1 % EX CREA: 1.0000 "application " | TOPICAL_CREAM | Freq: Two times a day (BID) | CUTANEOUS | Status: DC

## 2012-08-16 NOTE — Progress Notes (Signed)
79 yo woman with left sided lung cancer, diabetes, and skin rash.  She just completed her radiation therapy and has left sided scapular itchy rash.  She also notes that her vision is diminishing, particularly in the left eye.  She continues to be weak, but is using a cane or walker.  She has fallen a couple times and scraped her knees and right elbow, but the pelvis is less sore now.  Blood sugars are stable with no hypglycemic spells.  No sugars over 200  Objective:  NAD Skin:  Red, excoriated, rough over left scapular area.  She has a tender pic line site right chest without evidence of cellulitis Chest:  Clear Heart:  Reg, no murmur Eyes:  Moderate retinopathy bilaterally with neovascular proliferation Patient is walking independently, slowly, but is stable.  Gets out of chair without using arms  Assessment: Eczema - Plan: triamcinolone cream (KENALOG) 0.1 %  Xerosis cutis - Plan: triamcinolone cream (KENALOG) 0.1 %  Radiation dermatitis - Plan: triamcinolone cream (KENALOG) 0.1 %  Anxiety - Plan: clonazePAM (KLONOPIN) 0.5 MG tablet  Hypothyroid - Plan: levothyroxine (SYNTHROID, LEVOTHROID) 112 MCG tablet  Anxiety state, unspecified - Plan: doxepin (SINEQUAN) 25 MG capsule  Signed, [name], MD

## 2012-08-16 NOTE — Telephone Encounter (Signed)
Thank you , to scheduling, will you please call patient with overbook appt on next Thursday?

## 2012-08-17 NOTE — Telephone Encounter (Signed)
Pt seen by Dr. Elbert Ewings at 102 on 08/16/12.

## 2012-08-23 ENCOUNTER — Ambulatory Visit (HOSPITAL_BASED_OUTPATIENT_CLINIC_OR_DEPARTMENT_OTHER): Payer: Medicare Other | Admitting: Internal Medicine

## 2012-08-23 ENCOUNTER — Telehealth: Payer: Self-pay | Admitting: *Deleted

## 2012-08-23 ENCOUNTER — Ambulatory Visit (HOSPITAL_BASED_OUTPATIENT_CLINIC_OR_DEPARTMENT_OTHER): Payer: Medicare Other

## 2012-08-23 ENCOUNTER — Ambulatory Visit (HOSPITAL_COMMUNITY)
Admission: RE | Admit: 2012-08-23 | Discharge: 2012-08-23 | Disposition: A | Discharge status: Home / Self Care | Payer: Medicare Other | Source: Ambulatory Visit | Attending: Internal Medicine | Admitting: Internal Medicine

## 2012-08-23 ENCOUNTER — Other Ambulatory Visit (HOSPITAL_BASED_OUTPATIENT_CLINIC_OR_DEPARTMENT_OTHER): Payer: Medicare Other | Admitting: Lab

## 2012-08-23 ENCOUNTER — Encounter: Payer: Self-pay | Admitting: Internal Medicine

## 2012-08-23 ENCOUNTER — Telehealth: Payer: Self-pay | Admitting: Internal Medicine

## 2012-08-23 VITALS — BP 139/74 | HR 83 | Temp 97.8°F | Resp 20 | Ht 63.0 in | Wt 144.9 lb

## 2012-08-23 DIAGNOSIS — C3492 Malignant neoplasm of unspecified part of left bronchus or lung: Secondary | ICD-10-CM

## 2012-08-23 DIAGNOSIS — K6389 Other specified diseases of intestine: Secondary | ICD-10-CM | POA: Insufficient documentation

## 2012-08-23 DIAGNOSIS — R599 Enlarged lymph nodes, unspecified: Secondary | ICD-10-CM | POA: Insufficient documentation

## 2012-08-23 DIAGNOSIS — N289 Disorder of kidney and ureter, unspecified: Secondary | ICD-10-CM | POA: Insufficient documentation

## 2012-08-23 DIAGNOSIS — Z923 Personal history of irradiation: Secondary | ICD-10-CM | POA: Insufficient documentation

## 2012-08-23 DIAGNOSIS — E278 Other specified disorders of adrenal gland: Secondary | ICD-10-CM | POA: Insufficient documentation

## 2012-08-23 DIAGNOSIS — C3410 Malignant neoplasm of upper lobe, unspecified bronchus or lung: Secondary | ICD-10-CM

## 2012-08-23 DIAGNOSIS — C3490 Malignant neoplasm of unspecified part of unspecified bronchus or lung: Secondary | ICD-10-CM | POA: Insufficient documentation

## 2012-08-23 DIAGNOSIS — I2510 Atherosclerotic heart disease of native coronary artery without angina pectoris: Secondary | ICD-10-CM | POA: Insufficient documentation

## 2012-08-23 DIAGNOSIS — M948X9 Other specified disorders of cartilage, unspecified sites: Secondary | ICD-10-CM | POA: Insufficient documentation

## 2012-08-23 DIAGNOSIS — Z79899 Other long term (current) drug therapy: Secondary | ICD-10-CM | POA: Insufficient documentation

## 2012-08-23 DIAGNOSIS — Z5111 Encounter for antineoplastic chemotherapy: Secondary | ICD-10-CM

## 2012-08-23 DIAGNOSIS — I700 Atherosclerosis of aorta: Secondary | ICD-10-CM | POA: Insufficient documentation

## 2012-08-23 DIAGNOSIS — I7090 Unspecified atherosclerosis: Secondary | ICD-10-CM | POA: Insufficient documentation

## 2012-08-23 DIAGNOSIS — K8689 Other specified diseases of pancreas: Secondary | ICD-10-CM | POA: Insufficient documentation

## 2012-08-23 LAB — CBC WITH DIFFERENTIAL/PLATELET
BASO%: 0.3 % (ref 0.0–2.0)
Basophils Absolute: 0 10e3/uL (ref 0.0–0.1)
EOS%: 0.4 % (ref 0.0–7.0)
Eosinophils Absolute: 0 10e3/uL (ref 0.0–0.5)
HCT: 37.1 % (ref 34.8–46.6)
HGB: 12.6 g/dL (ref 11.6–15.9)
LYMPH%: 13.5 % — ABNORMAL LOW (ref 14.0–49.7)
MCH: 28.6 pg (ref 25.1–34.0)
MCHC: 34 g/dL (ref 31.5–36.0)
MCV: 84.3 fL (ref 79.5–101.0)
MONO#: 0.8 10e3/uL (ref 0.1–0.9)
MONO%: 10.5 % (ref 0.0–14.0)
NEUT#: 5.4 10e3/uL (ref 1.5–6.5)
NEUT%: 75.3 % (ref 38.4–76.8)
Platelets: 254 10e3/uL (ref 145–400)
RBC: 4.4 10e6/uL (ref 3.70–5.45)
RDW: 17.3 % — ABNORMAL HIGH (ref 11.2–14.5)
WBC: 7.2 10e3/uL (ref 3.9–10.3)
lymph#: 1 10e3/uL (ref 0.9–3.3)
nRBC: 0 % (ref 0–0)

## 2012-08-23 LAB — COMPREHENSIVE METABOLIC PANEL (CC13)
ALT: 17 U/L (ref 0–55)
AST: 23 U/L (ref 5–34)
Albumin: 3.4 g/dL — ABNORMAL LOW (ref 3.5–5.0)
BUN: 9.2 mg/dL (ref 7.0–26.0)
Calcium: 9.4 mg/dL (ref 8.4–10.4)
Chloride: 99 mEq/L (ref 98–109)
Potassium: 4.1 mEq/L (ref 3.5–5.1)

## 2012-08-23 MED ORDER — ONDANSETRON 16 MG/50ML IVPB (CHCC)
16.0000 mg | Freq: Once | INTRAVENOUS | Status: AC
  Administered 2012-08-23: 16 mg via INTRAVENOUS

## 2012-08-23 MED ORDER — SODIUM CHLORIDE 0.9 % IV SOLN
120.0000 mg/m2 | Freq: Once | INTRAVENOUS | Status: AC
  Administered 2012-08-23: 200 mg via INTRAVENOUS
  Filled 2012-08-23: qty 10

## 2012-08-23 MED ORDER — IOHEXOL 300 MG/ML  SOLN
100.0000 mL | Freq: Once | INTRAMUSCULAR | Status: AC | PRN
  Administered 2012-08-23: 100 mL via INTRAVENOUS

## 2012-08-23 MED ORDER — DEXAMETHASONE SODIUM PHOSPHATE 20 MG/5ML IJ SOLN
20.0000 mg | Freq: Once | INTRAMUSCULAR | Status: AC
  Administered 2012-08-23: 20 mg via INTRAVENOUS

## 2012-08-23 MED ORDER — SODIUM CHLORIDE 0.9 % IV SOLN
430.0000 mg | Freq: Once | INTRAVENOUS | Status: AC
  Administered 2012-08-23: 430 mg via INTRAVENOUS
  Filled 2012-08-23: qty 43

## 2012-08-23 MED ORDER — SODIUM CHLORIDE 0.9 % IJ SOLN
10.0000 mL | INTRAMUSCULAR | Status: DC | PRN
  Administered 2012-08-23: 10 mL
  Filled 2012-08-23: qty 10

## 2012-08-23 MED ORDER — HEPARIN SOD (PORK) LOCK FLUSH 100 UNIT/ML IV SOLN
500.0000 [IU] | Freq: Once | INTRAVENOUS | Status: AC | PRN
  Administered 2012-08-23: 500 [IU]
  Filled 2012-08-23: qty 5

## 2012-08-23 NOTE — Telephone Encounter (Signed)
Per staff phone call and POF I have schedueld appts.  JMW  

## 2012-08-23 NOTE — Patient Instructions (Addendum)
[facility] Discharge Instructions for Patients Receiving Chemotherapy  Today you received the following chemotherapy agents: VP16, Carboplatin.  To help prevent nausea and vomiting after your treatment, we encourage you to take your nausea medication.   If you develop nausea and vomiting that is not controlled by your nausea medication, call the clinic.   BELOW ARE SYMPTOMS THAT SHOULD BE REPORTED IMMEDIATELY:  *FEVER GREATER THAN 100.5 F  *CHILLS WITH OR WITHOUT FEVER  NAUSEA AND VOMITING THAT IS NOT CONTROLLED WITH YOUR NAUSEA MEDICATION  *UNUSUAL SHORTNESS OF BREATH  *UNUSUAL BRUISING OR BLEEDING  TENDERNESS IN MOUTH AND THROAT WITH OR WITHOUT PRESENCE OF ULCERS  *URINARY PROBLEMS  *BOWEL PROBLEMS  UNUSUAL RASH Items with * indicate a potential emergency and should be followed up as soon as possible.  Feel free to call the clinic you have any questions or concerns. The clinic phone number is [phone].

## 2012-08-23 NOTE — Progress Notes (Signed)
[facility] [phone]   [phone]  OFFICE PROGRESS NOTE  Misty Sidle, MD [address] [place] [address]  DIAGNOSIS: Extensive stage small cell lung cancer diagnosed in May of 2014   PRIOR THERAPY: None   CURRENT THERAPY: Systemic chemotherapy with carboplatin for AUC of 5 on day 1 and etoposide at 120 mg/M2 on days 1, 2 and 3 with Neulasta support on day 4, first dose started on 07/12/2012. Status post 2 cycles.   INTERVAL HISTORY: Misty Ball 79 y.o. female returns to the clinic today for followup visit accompanied by her daughter Misty Ball. The patient tolerated the second cycle of her systemic chemotherapy fairly well with no significant adverse effects. She denied having any nausea or vomiting. She denied having any fever or chills. The patient has significant improvement in her breathing after the second cycle of her chemotherapy. She denied having any significant chest pain, cough or hemoptysis. She has repeat CT scan of the chest, abdomen and pelvis performed recently and she is here for evaluation and discussion of her scan results.  MEDICAL HISTORY: Past Medical History  Diagnosis Date  . Hypertension   . Pancreatitis   . Diabetes mellitus   . Thyroid disease   . lung ca dx'd 06/2012    ALLERGIES:  is allergic to sulfa antibiotics.  MEDICATIONS:  Current Outpatient Prescriptions  Medication Sig Dispense Refill  . aspirin EC 81 MG tablet Take 81 mg by mouth daily.      . clonazePAM (KLONOPIN) 0.5 MG tablet Take 2 tablets (1 mg total) by mouth at bedtime.  90 tablet  1  . doxepin (SINEQUAN) 25 MG capsule Take 1 capsule (25 mg total) by mouth at bedtime.  90 capsule  3  . hyaluronate sodium (RADIAPLEXRX) GEL Apply 1 application topically 2 (two) times daily.      [name] HYDROcodone-ibuprofen (VICOPROFEN) 7.5-200 MG per tablet Take 1 tablet by mouth every 8 (eight) hours as needed for pain.  30 tablet  0  . Insulin Glargine  (LANTUS SOLOSTAR) 100 UNIT/ML SOPN Inject 0.42 mLs into the skin daily.  5 pen  11  . levothyroxine (SYNTHROID, LEVOTHROID) 112 MCG tablet Take 1 tablet (112 mcg total) by mouth daily.  90 tablet  3  . lidocaine-prilocaine (EMLA) cream Apply topically as needed. Apply to port 1 hour before chemotherapy  30 g  0  . lidocaine-prilocaine (EMLA) cream Apply topically as needed.  30 g  0  . lipase/protease/amylase (CREON-10/PANCREASE) 12000 UNITS CPEP Take 1 capsule by mouth 3 (three) times daily before meals.      [name] oxyCODONE-acetaminophen (PERCOCET/ROXICET) 5-325 MG per tablet Take 1 tablet by mouth every 6 (six) hours as needed for pain.  100 tablet  0  . prochlorperazine (COMPAZINE) 10 MG tablet Take 1 tablet (10 mg total) by mouth every 6 (six) hours as needed.  60 tablet  0  . traMADol (ULTRAM) 50 MG tablet Take 1 tablet (50 mg total) by mouth every 8 (eight) hours as needed for pain.  30 tablet  0  . triamcinolone cream (KENALOG) 0.1 % Apply 1 application topically 2 (two) times daily.  454 g  4   No current facility-administered medications for this visit.    SURGICAL HISTORY:  Past Surgical History  Procedure Laterality Date  . Abdominal hysterectomy    . Cholecystectomy    . Appendectomy    . Video bronchoscopy Bilateral 05/10/2012    Procedure: VIDEO BRONCHOSCOPY  WITHOUT FLUORO;  Surgeon: Nyoka Cowden, MD;  Location: [facility];  Service: Endoscopy;  Laterality: Bilateral;    REVIEW OF SYSTEMS:  A comprehensive review of systems was negative except for: Constitutional: positive for fatigue   PHYSICAL EXAMINATION: General appearance: alert, cooperative and no distress Head: Normocephalic, without obvious abnormality, atraumatic Neck: no adenopathy Lymph nodes: Cervical, supraclavicular, and axillary nodes normal. Resp: clear to auscultation bilaterally Back: negative Cardio: regular rate and rhythm, S1, S2 normal, no murmur, click, rub or gallop GI: soft, non-tender; bowel  sounds normal; no masses,  no organomegaly Extremities: extremities normal, atraumatic, no cyanosis or edema Neurologic: Alert and oriented X 3, normal strength and tone. Normal symmetric reflexes. Normal coordination and gait  ECOG PERFORMANCE STATUS: 1 - Symptomatic but completely ambulatory  Blood pressure 139/74, pulse 83, temperature 97.8 F (36.6 C), temperature source Oral, resp. rate 20, height 5\' 3"  (1.6 m), weight 144 lb 14.4 oz (65.726 kg).  LABORATORY DATA: Lab Results  Component Value Date   WBC 7.2 08/23/2012   HGB 12.6 08/23/2012   HCT 37.1 08/23/2012   MCV 84.3 08/23/2012   PLT 254 08/23/2012      Chemistry      Component Value Date/Time   NA 134* 08/16/2012 1345   NA 135 06/01/2012 1116   K 4.0 08/16/2012 1345   K 4.3 06/01/2012 1116   CL 101 08/02/2012 0800   CL 99 06/01/2012 1116   CO2 26 08/16/2012 1345   CO2 29 06/01/2012 1116   BUN 12.5 08/16/2012 1345   BUN 10 06/01/2012 1116   CREATININE 0.8 08/16/2012 1345   CREATININE 0.66 06/01/2012 1116   CREATININE 0.73 03/28/2012 0850      Component Value Date/Time   CALCIUM 8.7 08/16/2012 1345   CALCIUM 9.1 06/01/2012 1116   ALKPHOS 101 08/16/2012 1345   ALKPHOS 70 06/01/2012 1116   AST 20 08/16/2012 1345   AST 15 06/01/2012 1116   ALT 14 08/16/2012 1345   ALT 9 06/01/2012 1116   BILITOT 0.22 08/16/2012 1345   BILITOT 0.5 06/01/2012 1116       RADIOGRAPHIC STUDIES: Ct Chest W Contrast  08/23/2012   *RADIOLOGY REPORT*  Clinical Data:  Lung cancer status post radiation therapy. Chemotherapy in progress.  Cough, nausea and constipation.  CT CHEST, ABDOMEN AND PELVIS WITH CONTRAST  Technique:  Multidetector CT imaging of the chest, abdomen and pelvis was performed following the standard protocol during bolus administration of intravenous contrast.  Contrast: OMNIPAQUE IOHEXOL 300 MG/ML  SOLN  Comparison:  CT of the abdomen and pelvis 03/01/2010.    CT CHEST  Findings:  Mediastinum: Right internal jugular single lumen Port-A-Cath with  tip terminating in the right atrium. Heart size is normal. There is no significant pericardial fluid, thickening or pericardial calcification. There is atherosclerosis of the thoracic aorta, the great vessels of the mediastinum and the coronary arteries, including calcified atherosclerotic plaque in the left main, left anterior descending, left circumflex and right coronary arteries. Extensive calcifications of the mitral annulus. No pathologically enlarged mediastinal or hilar lymph nodes. Abundant amorphous soft tissue in the left suprahilar region corresponds to an area of treated lymphadenopathy based on comparison with prior examinations.  There is also some amorphous soft tissue throughout the middle mediastinum, particularly in the subcarinal and low left paratracheal stations, corresponding with treated nodal tissue based on comparison with the prior examination.  Borderline enlarged low right paratracheal lymph node.  Esophagus is unremarkable in appearance.  Separate  origin of the left vertebral artery directly off the aortic arch (normal anatomical variant) incidentally noted. Lower cervical and supraclavicular lymph nodes noted bilaterally are conspicuous, but do not meet CT criteria for enlargement at this time.  Lungs/Pleura: Compared to the prior examination, the large mass in the left upper lobe has largely resolved, now with an amorphous area of residual architectural distortion which extends from the pleural surface anterolaterally retrograde toward the left hilum, contiguous with the amorphous soft tissue and hilar regions mentioned above.  Throughout this area there is surrounding ground- glass attenuation, septal thickening and architectural distortion. Several areas of mild soft tissue thickening are noted throughout this area of architectural distortion, without a well-defined nodule or mass on today's examination.  The largest bulk of tissue is identified on image 13 of series 2 along the  pleural surface where currently measures approximately 2.1 x 1.3 cm.  Small amount of scarring in the inferior aspect of the right upper lobe adjacent to the minor fissure and in the medial segment of the right middle lobe, unchanged compared to the prior examination.  No acute consolidative air space disease.  No pleural effusions.  Musculoskeletal: Focal area of mild cortical irregularity and periosteal reaction in the lateral aspect of the left fifth rib is nonspecific, but likely represent an area of radiation osteitis. Compared to the prior examination there has been increasing sclerosis and further compression of T6, now with approximately 15% loss of anterior vertebral body height. The sclerosis of T6 is slightly asymmetric involving the left side of the vertebral body posteriorly to the a greater extent than the remainder of the vertebral body.  IMPRESSION:  1.  Today's study demonstrates a positive response to therapy, with a near complete regression of the left upper lobe mass (now with only residual areas of nodular architectural distortion), as well as progression of the previously noted left hilar and mediastinal lymphadenopathy.  There continues to be extensive amorphous soft tissue throughout the previously noted mediastinal and hilar nodal stations, which could reflect residual disease.  Continued attention on follow-up studies is recommended.  Additionally, the supraclavicular and low cervical lymphadenopathy appears improved. 2.  Further progression of sclerosis and compression at T6. Whether not this is a benign compression fracture, or fracture at site of prior metastasis remains uncertain, however, given the mild hypermetabolism in this region on the prior PET CT 05/24/2012, a pathologic fracture is suspected.  Continued attention on follow-up studies is recommended. 3. Atherosclerosis, including left main and three-vessel coronary artery disease.    CT ABDOMEN AND PELVIS  Findings:   Abdomen/Pelvis: Minimal intrahepatic biliary ductal dilatation appears to be chronic and is similar to the prior study.  No dilatation of the common bile duct is noted.  The patient is status post cholecystectomy.  No focal cystic or solid hepatic lesions are identified.  Atrophy of the pancreas with multifocal pancreatic parenchymal calcifications, compatible with chronic pancreatitis. The appearance of the spleen is unremarkable.  There are numerous subcentimeter low attenuation lesions scattered throughout the kidneys bilaterally which are too small to definitively characterize, but are favored to represent cysts.  1.8 x 1.1 cm left adrenal nodule and 1.5 x 1.1 cm right adrenal nodule appear increased compared to prior examinations and are concerning for potential adrenal metastases.  Extensive atherosclerosis throughout the abdominal and pelvic vasculature, without definite aneurysm or dissection.  No significant volume of ascites.  No pneumoperitoneum.  No pathologic distension of small bowel.  However, there is some circumferential  bowel wall thickening in the region of the distal ileum.  Status post hysterectomy.  Ovaries are not confidently identified may be surgically absent or atrophic.  Urinary bladder is unremarkable in appearance.  Musculoskeletal: At this site of prior hypermetabolism in the anterior aspect of the right ilium there is now a more clearly defined lytic lesion measuring approximately 1.6 cm in diameter (image 91 of series 2), compatible with a metastatic lesion. In addition, image 88 of series 2 demonstrates a new mildly sclerotic lesion measuring 1 cm in diameter in the right ilium of above this lesion.  There are also areas of irregular sclerosis which appear to be developing immediately lateral to the sacroiliac joints bilaterally, which is nonspecific, but may be concerning for additional sites of skeletal metastasis.  IMPRESSION:  1.  Interval enlargement of bilateral adrenal nodules  concerning for adrenal metastases. 2.  Previously described focus of hypermetabolism in the right ilium corresponds with an aggressive appearing lesion on today's examination, compatible with a skeletal metastasis.  There are also new areas of sclerosis immediately above this within the right ilium, and immediately lateral to the sacroiliac joints bilaterally, which may represent additional skeletal metastases. 3.  Interval development of some circumferential bowel wall thickening throughout the distal ileum.  This is of uncertain etiology and significance, but correlation for signs and symptoms of enteritis is recommended. 4.  Additional incidental findings, as above, similar to prior examinations.   Original Report Authenticated By: Trudie Reed, M.D.   ASSESSMENT AND PLAN: This is a very pleasant 79 years old white female with extensive stage small cell lung cancer status post 2 cycles of systemic chemotherapy with carboplatin and etoposide with significant improvement in her disease in the chest but mild interval enlargement of bilateral adrenal nodules that need to be monitored closely.  I have a lengthy discussion with the patient and her daughter about her scan results. I recommended for her to continue systemic chemotherapy with carboplatin and etoposide as scheduled.  The patient receive cycle #3 today.  She would come back for followup visit in 3 weeks with the start of cycle #4. She was advised to call immediately if she has any concerning symptoms in the interval.  All questions were answered. The patient knows to call the clinic with any problems, questions or concerns. We can certainly see the patient much sooner if necessary.  I spent 15 minutes counseling the patient face to face. The total time spent in the appointment was 25 minutes.

## 2012-08-23 NOTE — Telephone Encounter (Signed)
gv and printed appt sched and avs for pt....MW added tx   °

## 2012-08-23 NOTE — Patient Instructions (Signed)
He has significant improvement in her disease in the chest.  Continue systemic chemotherapy with carboplatin and etoposide.  Followup visit in 3 weeks.

## 2012-08-24 ENCOUNTER — Ambulatory Visit (HOSPITAL_BASED_OUTPATIENT_CLINIC_OR_DEPARTMENT_OTHER): Payer: Medicare Other

## 2012-08-24 ENCOUNTER — Ambulatory Visit: Payer: Medicare Other | Admitting: Nutrition

## 2012-08-24 VITALS — BP 127/53 | HR 76 | Temp 97.6°F

## 2012-08-24 DIAGNOSIS — C3492 Malignant neoplasm of unspecified part of left bronchus or lung: Secondary | ICD-10-CM

## 2012-08-24 DIAGNOSIS — Z5111 Encounter for antineoplastic chemotherapy: Secondary | ICD-10-CM

## 2012-08-24 DIAGNOSIS — C3410 Malignant neoplasm of upper lobe, unspecified bronchus or lung: Secondary | ICD-10-CM

## 2012-08-24 MED ORDER — DEXAMETHASONE SODIUM PHOSPHATE 10 MG/ML IJ SOLN
10.0000 mg | Freq: Once | INTRAMUSCULAR | Status: AC
  Administered 2012-08-24: 10 mg via INTRAVENOUS

## 2012-08-24 MED ORDER — HEPARIN SOD (PORK) LOCK FLUSH 100 UNIT/ML IV SOLN
500.0000 [IU] | Freq: Once | INTRAVENOUS | Status: AC | PRN
  Administered 2012-08-24: 500 [IU]
  Filled 2012-08-24: qty 5

## 2012-08-24 MED ORDER — SODIUM CHLORIDE 0.9 % IJ SOLN
10.0000 mL | INTRAMUSCULAR | Status: DC | PRN
  Administered 2012-08-24: 10 mL
  Filled 2012-08-24: qty 10

## 2012-08-24 MED ORDER — SODIUM CHLORIDE 0.9 % IV SOLN
Freq: Once | INTRAVENOUS | Status: AC
  Administered 2012-08-24: 11:00:00 via INTRAVENOUS

## 2012-08-24 MED ORDER — ONDANSETRON 8 MG/50ML IVPB (CHCC)
8.0000 mg | Freq: Once | INTRAVENOUS | Status: AC
  Administered 2012-08-24: 8 mg via INTRAVENOUS

## 2012-08-24 MED ORDER — SODIUM CHLORIDE 0.9 % IV SOLN
120.0000 mg/m2 | Freq: Once | INTRAVENOUS | Status: AC
  Administered 2012-08-24: 200 mg via INTRAVENOUS
  Filled 2012-08-24: qty 10

## 2012-08-24 NOTE — Patient Instructions (Addendum)
[facility] Discharge Instructions for Patients Receiving Chemotherapy  Today you received the following chemotherapy agents: Etoposide.  To help prevent nausea and vomiting after your treatment, we encourage you to take your nausea medication as prescribed.   If you develop nausea and vomiting that is not controlled by your nausea medication, call the clinic.   BELOW ARE SYMPTOMS THAT SHOULD BE REPORTED IMMEDIATELY:  *FEVER GREATER THAN 100.5 F  *CHILLS WITH OR WITHOUT FEVER  NAUSEA AND VOMITING THAT IS NOT CONTROLLED WITH YOUR NAUSEA MEDICATION  *UNUSUAL SHORTNESS OF BREATH  *UNUSUAL BRUISING OR BLEEDING  TENDERNESS IN MOUTH AND THROAT WITH OR WITHOUT PRESENCE OF ULCERS  *URINARY PROBLEMS  *BOWEL PROBLEMS  UNUSUAL RASH Items with * indicate a potential emergency and should be followed up as soon as possible.  Feel free to call the clinic you have any questions or concerns. The clinic phone number is [phone].    

## 2012-08-24 NOTE — Progress Notes (Addendum)
Nutrition Follow-Up Note  Pt presents for nutrition follow-up in chemotherapy room. She reports her appetite is "ok" and reveals that some days are better than others. She eats 3 meals per day, with occasional snacks. Breakfast consists of bacon, sauage, and grits or all bran cheerios, 2 cups of coffee, and 1/2 glass of Sprite Zero. Lunch varies and pt admits that she often skips, due to lack of appetite. Supper consists of chicken, cream potatoes, and biscuits. She also reports that if she gets hungry when making dinner she will eat a slice of toast with peanut butter. Snacks will consist of peanut butter crackers, graham crackers with peanut butter, or fruit when she feels hungry throughout the day. Unfortunately, pt continues to lose weight due to inadequate intake. Current wt is 144.9#. Wt is down 1.3# from 08/02/12, which is a 1.5% loss.  Pt informed this RD that she has discontinued Boost. She tried drinking smaller amounts per RD suggestion, but reports "it still tore up my bowels". She is now drinking 2 bottles of Ensure daily and is tolerating well. She has multiple questions regarding appropriate foods for her.   Nutrition diagnosis: Food and nutrition related knowledge deficit continues.  Intervention: Patient was educated on strategies to increase calories and protein throughout the day. Discussed small, frequent meals and ways to increase calorie and protein content in foods. Reviewed importance of using nutrition supplements to increase calories and protein along with diet. Recommend purchasing Ensure Complete, due to increased nutritional density; provided coupons. Also encouraged pt to take advantage of times when appetite is good. Also reviewed education on appropriate snacks to prevent episodes of hypoglycemia. Teachback method used.  Monitoring, evaluation, goals: Patient has been unable to tolerated increased calories and protein and has had continued weight loss.   Next visit: 2-3  weeks, during chemotherapy, per scheduler.

## 2012-08-25 ENCOUNTER — Ambulatory Visit (HOSPITAL_BASED_OUTPATIENT_CLINIC_OR_DEPARTMENT_OTHER): Payer: Medicare Other

## 2012-08-25 VITALS — BP 134/64 | HR 82 | Temp 98.1°F

## 2012-08-25 DIAGNOSIS — Z5111 Encounter for antineoplastic chemotherapy: Secondary | ICD-10-CM

## 2012-08-25 DIAGNOSIS — C3410 Malignant neoplasm of upper lobe, unspecified bronchus or lung: Secondary | ICD-10-CM

## 2012-08-25 DIAGNOSIS — C3492 Malignant neoplasm of unspecified part of left bronchus or lung: Secondary | ICD-10-CM

## 2012-08-25 MED ORDER — HEPARIN SOD (PORK) LOCK FLUSH 100 UNIT/ML IV SOLN
500.0000 [IU] | Freq: Once | INTRAVENOUS | Status: AC | PRN
  Administered 2012-08-25: 500 [IU]
  Filled 2012-08-25: qty 5

## 2012-08-25 MED ORDER — SODIUM CHLORIDE 0.9 % IV SOLN
120.0000 mg/m2 | Freq: Once | INTRAVENOUS | Status: AC
  Administered 2012-08-25: 200 mg via INTRAVENOUS
  Filled 2012-08-25: qty 10

## 2012-08-25 MED ORDER — SODIUM CHLORIDE 0.9 % IJ SOLN
10.0000 mL | INTRAMUSCULAR | Status: DC | PRN
  Administered 2012-08-25: 10 mL
  Filled 2012-08-25: qty 10

## 2012-08-25 MED ORDER — DEXAMETHASONE SODIUM PHOSPHATE 10 MG/ML IJ SOLN
10.0000 mg | Freq: Once | INTRAMUSCULAR | Status: AC
  Administered 2012-08-25: 10 mg via INTRAVENOUS

## 2012-08-25 MED ORDER — SODIUM CHLORIDE 0.9 % IV SOLN
Freq: Once | INTRAVENOUS | Status: AC
  Administered 2012-08-25: 11:00:00 via INTRAVENOUS

## 2012-08-25 MED ORDER — ONDANSETRON 8 MG/50ML IVPB (CHCC)
8.0000 mg | Freq: Once | INTRAVENOUS | Status: AC
  Administered 2012-08-25: 8 mg via INTRAVENOUS

## 2012-08-25 NOTE — Patient Instructions (Addendum)
[facility] Discharge Instructions for Patients Receiving Chemotherapy  Today you received the following chemotherapy agents: Etoposide  To help prevent nausea and vomiting after your treatment, we encourage you to take your nausea medication as directed by your MD.   If you develop nausea and vomiting that is not controlled by your nausea medication, call the clinic.   BELOW ARE SYMPTOMS THAT SHOULD BE REPORTED IMMEDIATELY:  *FEVER GREATER THAN 100.5 F  *CHILLS WITH OR WITHOUT FEVER  NAUSEA AND VOMITING THAT IS NOT CONTROLLED WITH YOUR NAUSEA MEDICATION  *UNUSUAL SHORTNESS OF BREATH  *UNUSUAL BRUISING OR BLEEDING  TENDERNESS IN MOUTH AND THROAT WITH OR WITHOUT PRESENCE OF ULCERS  *URINARY PROBLEMS  *BOWEL PROBLEMS  UNUSUAL RASH Items with * indicate a potential emergency and should be followed up as soon as possible.  Feel free to call the clinic you have any questions or concerns. The clinic phone number is [phone].    

## 2012-08-25 NOTE — Progress Notes (Signed)
  Radiation Oncology         [phone] ________________________________  Name: Misty Ball MRN: [id number]  Date: 08/14/2012  DOB: 1931/04/25  End of Treatment Note  Diagnosis:   Small cell lung cancer     Indication for treatment:  palliative       Radiation treatment dates:   07/20/12 - 08/14/12  Site/dose:   The patient was treated to the left lung using a 3-field, 3D conformal technique. She received 37.5 Gy in 15 fractions.  Narrative: The patient tolerated radiation treatment relatively well.   The patient had mild/moderate skin irritation during treatment, also some nausea.  Plan: The patient has completed radiation treatment. The patient will return to radiation oncology clinic for routine followup in one month. I advised the patient to call or return sooner if they have any questions or concerns related to their recovery or treatment. ________________________________  Radene Gunning, M.D., Ph.D.

## 2012-08-26 ENCOUNTER — Ambulatory Visit (HOSPITAL_BASED_OUTPATIENT_CLINIC_OR_DEPARTMENT_OTHER): Payer: Medicare Other

## 2012-08-26 VITALS — BP 136/71 | HR 79 | Temp 97.8°F | Resp 18

## 2012-08-26 DIAGNOSIS — Z5189 Encounter for other specified aftercare: Secondary | ICD-10-CM

## 2012-08-26 DIAGNOSIS — C3410 Malignant neoplasm of upper lobe, unspecified bronchus or lung: Secondary | ICD-10-CM

## 2012-08-26 MED ORDER — PEGFILGRASTIM INJECTION 6 MG/0.6ML
6.0000 mg | Freq: Once | SUBCUTANEOUS | Status: AC
  Administered 2012-08-26: 6 mg via SUBCUTANEOUS

## 2012-08-30 ENCOUNTER — Other Ambulatory Visit (HOSPITAL_BASED_OUTPATIENT_CLINIC_OR_DEPARTMENT_OTHER): Payer: Medicare Other | Admitting: Lab

## 2012-08-30 DIAGNOSIS — C3490 Malignant neoplasm of unspecified part of unspecified bronchus or lung: Secondary | ICD-10-CM

## 2012-08-30 LAB — CBC WITH DIFFERENTIAL/PLATELET
Eosinophils Absolute: 0 10*3/uL (ref 0.0–0.5)
MONO#: 0.2 10*3/uL (ref 0.1–0.9)
MONO%: 5.4 % (ref 0.0–14.0)
NEUT#: 2 10*3/uL (ref 1.5–6.5)
RBC: 3.45 10*6/uL — ABNORMAL LOW (ref 3.70–5.45)
RDW: 18 % — ABNORMAL HIGH (ref 11.2–14.5)
WBC: 2.8 10*3/uL — ABNORMAL LOW (ref 3.9–10.3)

## 2012-08-30 LAB — COMPREHENSIVE METABOLIC PANEL (CC13)
ALT: 14 U/L (ref 0–55)
Albumin: 3 g/dL — ABNORMAL LOW (ref 3.5–5.0)
Alkaline Phosphatase: 100 U/L (ref 40–150)
CO2: 23 mEq/L (ref 22–29)
Glucose: 326 mg/dl — ABNORMAL HIGH (ref 70–140)
Potassium: 3.9 mEq/L (ref 3.5–5.1)
Sodium: 133 mEq/L — ABNORMAL LOW (ref 136–145)
Total Protein: 6.1 g/dL — ABNORMAL LOW (ref 6.4–8.3)

## 2012-09-06 ENCOUNTER — Other Ambulatory Visit (HOSPITAL_BASED_OUTPATIENT_CLINIC_OR_DEPARTMENT_OTHER): Payer: Medicare Other | Admitting: Lab

## 2012-09-06 DIAGNOSIS — C3490 Malignant neoplasm of unspecified part of unspecified bronchus or lung: Secondary | ICD-10-CM

## 2012-09-06 LAB — CBC WITH DIFFERENTIAL/PLATELET
BASO%: 0.3 % (ref 0.0–2.0)
EOS%: 0.5 % (ref 0.0–7.0)
Eosinophils Absolute: 0.1 10*3/uL (ref 0.0–0.5)
MCHC: 33.4 g/dL (ref 31.5–36.0)
MCV: 87 fL (ref 79.5–101.0)
MONO%: 5.4 % (ref 0.0–14.0)
NEUT#: 11.7 10*3/uL — ABNORMAL HIGH (ref 1.5–6.5)
RBC: 3.54 10*6/uL — ABNORMAL LOW (ref 3.70–5.45)
RDW: 19.2 % — ABNORMAL HIGH (ref 11.2–14.5)

## 2012-09-06 LAB — COMPREHENSIVE METABOLIC PANEL (CC13)
ALT: 16 U/L (ref 0–55)
AST: 23 U/L (ref 5–34)
Albumin: 3.1 g/dL — ABNORMAL LOW (ref 3.5–5.0)
Alkaline Phosphatase: 113 U/L (ref 40–150)
Glucose: 282 mg/dl — ABNORMAL HIGH (ref 70–140)
Potassium: 3.9 mEq/L (ref 3.5–5.1)
Sodium: 138 mEq/L (ref 136–145)
Total Protein: 6.5 g/dL (ref 6.4–8.3)

## 2012-09-13 ENCOUNTER — Ambulatory Visit (HOSPITAL_BASED_OUTPATIENT_CLINIC_OR_DEPARTMENT_OTHER): Payer: Medicare Other | Admitting: Physician Assistant

## 2012-09-13 ENCOUNTER — Telehealth: Payer: Self-pay | Admitting: Internal Medicine

## 2012-09-13 ENCOUNTER — Other Ambulatory Visit (HOSPITAL_BASED_OUTPATIENT_CLINIC_OR_DEPARTMENT_OTHER): Payer: Medicare Other | Admitting: Lab

## 2012-09-13 ENCOUNTER — Ambulatory Visit (HOSPITAL_BASED_OUTPATIENT_CLINIC_OR_DEPARTMENT_OTHER): Payer: Medicare Other

## 2012-09-13 ENCOUNTER — Other Ambulatory Visit: Payer: Medicare Other

## 2012-09-13 VITALS — BP 133/56 | HR 78 | Temp 98.3°F | Resp 18 | Ht 63.0 in | Wt 147.6 lb

## 2012-09-13 DIAGNOSIS — Z5111 Encounter for antineoplastic chemotherapy: Secondary | ICD-10-CM

## 2012-09-13 DIAGNOSIS — C3490 Malignant neoplasm of unspecified part of unspecified bronchus or lung: Secondary | ICD-10-CM

## 2012-09-13 DIAGNOSIS — R599 Enlarged lymph nodes, unspecified: Secondary | ICD-10-CM

## 2012-09-13 DIAGNOSIS — C3492 Malignant neoplasm of unspecified part of left bronchus or lung: Secondary | ICD-10-CM

## 2012-09-13 DIAGNOSIS — R110 Nausea: Secondary | ICD-10-CM

## 2012-09-13 DIAGNOSIS — C3410 Malignant neoplasm of upper lobe, unspecified bronchus or lung: Secondary | ICD-10-CM

## 2012-09-13 LAB — CBC WITH DIFFERENTIAL/PLATELET
EOS%: 0.4 % (ref 0.0–7.0)
LYMPH%: 12.1 % — ABNORMAL LOW (ref 14.0–49.7)
MCH: 29.3 pg (ref 25.1–34.0)
MCHC: 33.2 g/dL (ref 31.5–36.0)
MCV: 88.3 fL (ref 79.5–101.0)
MONO%: 7.9 % (ref 0.0–14.0)
Platelets: 286 10*3/uL (ref 145–400)
RBC: 3.75 10*6/uL (ref 3.70–5.45)
RDW: 21 % — ABNORMAL HIGH (ref 11.2–14.5)
nRBC: 0 % (ref 0–0)

## 2012-09-13 LAB — COMPREHENSIVE METABOLIC PANEL (CC13)
ALT: 12 U/L (ref 0–55)
Alkaline Phosphatase: 105 U/L (ref 40–150)
Creatinine: 0.8 mg/dL (ref 0.6–1.1)
Sodium: 136 mEq/L (ref 136–145)
Total Bilirubin: 0.22 mg/dL (ref 0.20–1.20)
Total Protein: 6.9 g/dL (ref 6.4–8.3)

## 2012-09-13 MED ORDER — SODIUM CHLORIDE 0.9 % IV SOLN
Freq: Once | INTRAVENOUS | Status: AC
  Administered 2012-09-13: 14:00:00 via INTRAVENOUS

## 2012-09-13 MED ORDER — ONDANSETRON 16 MG/50ML IVPB (CHCC)
16.0000 mg | Freq: Once | INTRAVENOUS | Status: AC
  Administered 2012-09-13: 16 mg via INTRAVENOUS

## 2012-09-13 MED ORDER — DEXAMETHASONE SODIUM PHOSPHATE 20 MG/5ML IJ SOLN
20.0000 mg | Freq: Once | INTRAMUSCULAR | Status: AC
  Administered 2012-09-13: 20 mg via INTRAVENOUS

## 2012-09-13 MED ORDER — SODIUM CHLORIDE 0.9 % IJ SOLN
10.0000 mL | INTRAMUSCULAR | Status: DC | PRN
  Administered 2012-09-13: 10 mL
  Filled 2012-09-13: qty 10

## 2012-09-13 MED ORDER — HEPARIN SOD (PORK) LOCK FLUSH 100 UNIT/ML IV SOLN
500.0000 [IU] | Freq: Once | INTRAVENOUS | Status: AC | PRN
  Administered 2012-09-13: 500 [IU]
  Filled 2012-09-13: qty 5

## 2012-09-13 MED ORDER — SODIUM CHLORIDE 0.9 % IV SOLN
120.0000 mg/m2 | Freq: Once | INTRAVENOUS | Status: AC
  Administered 2012-09-13: 200 mg via INTRAVENOUS
  Filled 2012-09-13: qty 10

## 2012-09-13 MED ORDER — SODIUM CHLORIDE 0.9 % IV SOLN
430.0000 mg | Freq: Once | INTRAVENOUS | Status: AC
  Administered 2012-09-13: 430 mg via INTRAVENOUS
  Filled 2012-09-13: qty 43

## 2012-09-13 NOTE — Telephone Encounter (Signed)
pt did not want to get barium today

## 2012-09-13 NOTE — Telephone Encounter (Signed)
gv and printed appt sched and avs for pt...emailed MB to add tx.   °

## 2012-09-13 NOTE — Patient Instructions (Addendum)
[facility] Discharge Instructions for Patients Receiving Chemotherapy  Today you received the following chemotherapy agents Carboplatin/Etoposide.  To help prevent nausea and vomiting after your treatment, we encourage you to take your nausea medication as prescribed.   If you develop nausea and vomiting that is not controlled by your nausea medication, call the clinic.   BELOW ARE SYMPTOMS THAT SHOULD BE REPORTED IMMEDIATELY:  *FEVER GREATER THAN 100.5 F  *CHILLS WITH OR WITHOUT FEVER  NAUSEA AND VOMITING THAT IS NOT CONTROLLED WITH YOUR NAUSEA MEDICATION  *UNUSUAL SHORTNESS OF BREATH  *UNUSUAL BRUISING OR BLEEDING  TENDERNESS IN MOUTH AND THROAT WITH OR WITHOUT PRESENCE OF ULCERS  *URINARY PROBLEMS  *BOWEL PROBLEMS  UNUSUAL RASH Items with * indicate a potential emergency and should be followed up as soon as possible.  Feel free to call the clinic you have any questions or concerns. The clinic phone number is [phone].

## 2012-09-14 ENCOUNTER — Ambulatory Visit: Payer: Medicare Other | Admitting: Nutrition

## 2012-09-14 ENCOUNTER — Ambulatory Visit (HOSPITAL_BASED_OUTPATIENT_CLINIC_OR_DEPARTMENT_OTHER): Payer: Medicare Other

## 2012-09-14 VITALS — BP 137/60 | HR 96 | Temp 97.0°F | Resp 16

## 2012-09-14 DIAGNOSIS — C3410 Malignant neoplasm of upper lobe, unspecified bronchus or lung: Secondary | ICD-10-CM

## 2012-09-14 DIAGNOSIS — C3490 Malignant neoplasm of unspecified part of unspecified bronchus or lung: Secondary | ICD-10-CM

## 2012-09-14 DIAGNOSIS — Z5111 Encounter for antineoplastic chemotherapy: Secondary | ICD-10-CM

## 2012-09-14 MED ORDER — ONDANSETRON 8 MG/50ML IVPB (CHCC)
8.0000 mg | Freq: Once | INTRAVENOUS | Status: AC
  Administered 2012-09-14: 8 mg via INTRAVENOUS

## 2012-09-14 MED ORDER — SODIUM CHLORIDE 0.9 % IV SOLN
Freq: Once | INTRAVENOUS | Status: AC
  Administered 2012-09-14: 13:00:00 via INTRAVENOUS

## 2012-09-14 MED ORDER — HEPARIN SOD (PORK) LOCK FLUSH 100 UNIT/ML IV SOLN
500.0000 [IU] | Freq: Once | INTRAVENOUS | Status: AC | PRN
  Administered 2012-09-14: 500 [IU]
  Filled 2012-09-14: qty 5

## 2012-09-14 MED ORDER — DEXAMETHASONE SODIUM PHOSPHATE 10 MG/ML IJ SOLN
10.0000 mg | Freq: Once | INTRAMUSCULAR | Status: AC
  Administered 2012-09-14: 10 mg via INTRAVENOUS

## 2012-09-14 MED ORDER — SODIUM CHLORIDE 0.9 % IV SOLN
120.0000 mg/m2 | Freq: Once | INTRAVENOUS | Status: AC
  Administered 2012-09-14: 200 mg via INTRAVENOUS
  Filled 2012-09-14: qty 10

## 2012-09-14 MED ORDER — SODIUM CHLORIDE 0.9 % IJ SOLN
10.0000 mL | INTRAMUSCULAR | Status: DC | PRN
  Administered 2012-09-14: 10 mL
  Filled 2012-09-14: qty 10

## 2012-09-14 NOTE — Patient Instructions (Addendum)
[facility] Discharge Instructions for Patients Receiving Chemotherapy  Today you received the following chemotherapy agents Etoposide.  To help prevent nausea and vomiting after your treatment, we encourage you to take your nausea medication.   If you develop nausea and vomiting that is not controlled by your nausea medication, call the clinic.   BELOW ARE SYMPTOMS THAT SHOULD BE REPORTED IMMEDIATELY:  *FEVER GREATER THAN 100.5 F  *CHILLS WITH OR WITHOUT FEVER  NAUSEA AND VOMITING THAT IS NOT CONTROLLED WITH YOUR NAUSEA MEDICATION  *UNUSUAL SHORTNESS OF BREATH  *UNUSUAL BRUISING OR BLEEDING  TENDERNESS IN MOUTH AND THROAT WITH OR WITHOUT PRESENCE OF ULCERS  *URINARY PROBLEMS  *BOWEL PROBLEMS  UNUSUAL RASH Items with * indicate a potential emergency and should be followed up as soon as possible.  Feel free to call the clinic you have any questions or concerns. The clinic phone number is [phone].    

## 2012-09-14 NOTE — Progress Notes (Signed)
Patient continues to report her appetite comes and goes.  Her weight has increased slightly to 147.6 pounds documented August.  6.  This is increased from 144.9 pounds mid July.  Patient continues to drink ensure and is tolerating well.  Patient reports that she cooked breakfast for her son and then could not eat any of this food.  So, she skipped breakfast and later ate salad.  Nutrition diagnosis: Food and nutrition related knowledge deficit continues.  Intervention: I educated patient on strategies for choosing different foods at mealtimes for improved tolerance.  I also recommended patient could choose Ensure Plus in place of a meal.  I again reviewed high protein snacks and ways to add protein to the foods she is eating.  Teach back method used.  Monitoring, evaluation, goals: Patient's weight has increased slightly.  She is tolerating oral nutrition supplements one to 2 daily.  Next visit: Thursday, August 28, during chemotherapy.

## 2012-09-15 ENCOUNTER — Ambulatory Visit (HOSPITAL_BASED_OUTPATIENT_CLINIC_OR_DEPARTMENT_OTHER): Payer: Medicare Other

## 2012-09-15 VITALS — BP 123/68 | HR 83 | Temp 98.3°F

## 2012-09-15 DIAGNOSIS — Z5111 Encounter for antineoplastic chemotherapy: Secondary | ICD-10-CM

## 2012-09-15 DIAGNOSIS — C3410 Malignant neoplasm of upper lobe, unspecified bronchus or lung: Secondary | ICD-10-CM

## 2012-09-15 DIAGNOSIS — C3490 Malignant neoplasm of unspecified part of unspecified bronchus or lung: Secondary | ICD-10-CM

## 2012-09-15 MED ORDER — SODIUM CHLORIDE 0.9 % IV SOLN
Freq: Once | INTRAVENOUS | Status: AC
  Administered 2012-09-15: 14:00:00 via INTRAVENOUS

## 2012-09-15 MED ORDER — PROCHLORPERAZINE MALEATE 10 MG PO TABS: 10.0000 mg | ORAL_TABLET | Freq: Four times a day (QID) | ORAL | Status: AC | PRN

## 2012-09-15 MED ORDER — DEXAMETHASONE SODIUM PHOSPHATE 10 MG/ML IJ SOLN
10.0000 mg | Freq: Once | INTRAMUSCULAR | Status: AC
  Administered 2012-09-15: 10 mg via INTRAVENOUS

## 2012-09-15 MED ORDER — HEPARIN SOD (PORK) LOCK FLUSH 100 UNIT/ML IV SOLN
500.0000 [IU] | Freq: Once | INTRAVENOUS | Status: AC | PRN
  Administered 2012-09-15: 500 [IU]
  Filled 2012-09-15: qty 5

## 2012-09-15 MED ORDER — SODIUM CHLORIDE 0.9 % IV SOLN
120.0000 mg/m2 | Freq: Once | INTRAVENOUS | Status: AC
  Administered 2012-09-15: 200 mg via INTRAVENOUS
  Filled 2012-09-15: qty 10

## 2012-09-15 MED ORDER — LIDOCAINE-PRILOCAINE 2.5-2.5 % EX CREA: TOPICAL_CREAM | CUTANEOUS | Status: DC | PRN

## 2012-09-15 MED ORDER — SODIUM CHLORIDE 0.9 % IJ SOLN
10.0000 mL | INTRAMUSCULAR | Status: DC | PRN
  Administered 2012-09-15: 10 mL
  Filled 2012-09-15: qty 10

## 2012-09-15 MED ORDER — ONDANSETRON 8 MG/50ML IVPB (CHCC)
8.0000 mg | Freq: Once | INTRAVENOUS | Status: AC
  Administered 2012-09-15: 8 mg via INTRAVENOUS

## 2012-09-15 NOTE — Patient Instructions (Addendum)
Continue weekly labs as scheduled Followup with Dr. Mohamed in 3 weeks with a restaging CT scan of your chest, abdomen and pelvis to reevaluate your disease. 

## 2012-09-15 NOTE — Patient Instructions (Addendum)
[facility] Discharge Instructions for Patients Receiving Chemotherapy  Today you received the following chemotherapy agents: Etoposide.  To help prevent nausea and vomiting after your treatment, we encourage you to take your nausea medication as prescribed.   If you develop nausea and vomiting that is not controlled by your nausea medication, call the clinic.   BELOW ARE SYMPTOMS THAT SHOULD BE REPORTED IMMEDIATELY:  *FEVER GREATER THAN 100.5 F  *CHILLS WITH OR WITHOUT FEVER  NAUSEA AND VOMITING THAT IS NOT CONTROLLED WITH YOUR NAUSEA MEDICATION  *UNUSUAL SHORTNESS OF BREATH  *UNUSUAL BRUISING OR BLEEDING  TENDERNESS IN MOUTH AND THROAT WITH OR WITHOUT PRESENCE OF ULCERS  *URINARY PROBLEMS  *BOWEL PROBLEMS  UNUSUAL RASH Items with * indicate a potential emergency and should be followed up as soon as possible.  Feel free to call the clinic you have any questions or concerns. The clinic phone number is [phone].    

## 2012-09-15 NOTE — Progress Notes (Addendum)
[facility] [phone]   [phone]  SHARED VISIT PROGRESS NOTE  [name], MD [address] [place] [address]  DIAGNOSIS: Extensive stage small cell lung cancer diagnosed in May of 2014   PRIOR THERAPY: None   CURRENT THERAPY: Systemic chemotherapy with carboplatin for AUC of 5 on day 1 and etoposide at 120 mg/M2 on days 1, 2 and 3 with Neulasta support on day 4, first dose started on 07/12/2012. Status post 3 cycles.  DISEASE STAGE: Extensive stage   CHEMOTHERAPY INTENT: Palliative  CURRENT # OF CHEMOTHERAPY CYCLES: 3  CURRENT ANTIEMETICS: Zofran, dexamethasone,Compazine  CURRENT SMOKING STATUS: Former smoker, quit 5/228/2014  ORAL CHEMOTHERAPY AND CONSENT: n/a  CURRENT BISPHOSPHONATES USE: none  PAIN MANAGEMENT: Percocet  NARCOTICS INDUCED CONSTIPATION:  occasional  LIVING WILL AND CODE STATUS: has a living will, DNR    INTERVAL HISTORY: Misty Ball 79 y.o. female returns to the clinic today for followup visit accompanied by her daughter Misty Ball. The patient tolerated the second cycle of her systemic chemotherapy fairly well with no significant adverse effects. She denied having any vomiting. She does have some nausea that is well controlled with her Compazine. She requests a refill for the Compazine and for the Emla cream. She denied having any fever or chills. The patient had significant improvement in her breathing after the second cycle of her chemotherapy. She reports rare cough. She does have occasional problems with constipation that are relived by drinking chocolate flavored Ensure. She denied having any significant chest pain, cough or hemoptysis.   MEDICAL HISTORY: Past Medical History  Diagnosis Date  . Hypertension   . Pancreatitis   . Diabetes mellitus   . Thyroid disease   . lung ca dx'd 06/2012    ALLERGIES:  is allergic to sulfa antibiotics.  MEDICATIONS:  Current Outpatient Prescriptions    Medication Sig Dispense Refill  . aspirin EC 81 MG tablet Take 81 mg by mouth daily.      . clonazePAM (KLONOPIN) 0.5 MG tablet Take 2 tablets (1 mg total) by mouth at bedtime.  90 tablet  1  . doxepin (SINEQUAN) 25 MG capsule Take 1 capsule (25 mg total) by mouth at bedtime.  90 capsule  3  . hyaluronate sodium (RADIAPLEXRX) GEL Apply 1 application topically 2 (two) times daily.      [name] HYDROcodone-ibuprofen (VICOPROFEN) 7.5-200 MG per tablet Take 1 tablet by mouth every 8 (eight) hours as needed for pain.  30 tablet  0  . Insulin Glargine (LANTUS SOLOSTAR) 100 UNIT/ML SOPN Inject 0.42 mLs into the skin daily.  5 pen  11  . levothyroxine (SYNTHROID, LEVOTHROID) 112 MCG tablet Take 1 tablet (112 mcg total) by mouth daily.  90 tablet  3  . lidocaine-prilocaine (EMLA) cream Apply topically as needed. Apply to port 1 hour before chemotherapy  30 g  0  . lidocaine-prilocaine (EMLA) cream Apply topically as needed.  30 g  0  . lipase/protease/amylase (CREON-10/PANCREASE) 12000 UNITS CPEP Take 1 capsule by mouth 3 (three) times daily before meals.      [name] oxyCODONE-acetaminophen (PERCOCET/ROXICET) 5-325 MG per tablet Take 1 tablet by mouth every 6 (six) hours as needed for pain.  100 tablet  0  . prochlorperazine (COMPAZINE) 10 MG tablet Take 1 tablet (10 mg total) by mouth every 6 (six) hours as needed.  60 tablet  0  . traMADol (ULTRAM) 50 MG tablet Take 1 tablet (50 mg total) by mouth every  8 (eight) hours as needed for pain.  30 tablet  0  . triamcinolone cream (KENALOG) 0.1 % Apply 1 application topically 2 (two) times daily.  454 g  4   No current facility-administered medications for this visit.    SURGICAL HISTORY:  Past Surgical History  Procedure Laterality Date  . Abdominal hysterectomy    . Cholecystectomy    . Appendectomy    . Video bronchoscopy Bilateral 05/10/2012    Procedure: VIDEO BRONCHOSCOPY WITHOUT FLUORO;  Surgeon: Nyoka Cowden, MD;  Location: [facility];  Service:  Endoscopy;  Laterality: Bilateral;    REVIEW OF SYSTEMS:  A comprehensive review of systems was negative except for: Constitutional: positive for fatigue   PHYSICAL EXAMINATION: General appearance: alert, cooperative and no distress Head: Normocephalic, without obvious abnormality, atraumatic Neck: no adenopathy Lymph nodes: Cervical, supraclavicular, and axillary nodes normal. Resp: clear to auscultation bilaterally Back: negative Cardio: regular rate and rhythm, S1, S2 normal, no murmur, click, rub or gallop GI: soft, non-tender; bowel sounds normal; no masses,  no organomegaly Extremities: extremities normal, atraumatic, no cyanosis or edema Neurologic: Alert and oriented X 3, normal strength and tone. Normal symmetric reflexes. Normal coordination and gait  ECOG PERFORMANCE STATUS: 1 - Symptomatic but completely ambulatory  Blood pressure 133/56, pulse 78, temperature 98.3 F (36.8 C), temperature source Oral, resp. rate 18, height 5\' 3"  (1.6 m), weight 147 lb 9.6 oz (66.951 kg).  LABORATORY DATA: Lab Results  Component Value Date   WBC 10.0 09/13/2012   HGB 11.0* 09/13/2012   HCT 33.1* 09/13/2012   MCV 88.3 09/13/2012   PLT 286 09/13/2012      Chemistry      Component Value Date/Time   NA 136 09/13/2012 1105   NA 135 06/01/2012 1116   K 4.0 09/13/2012 1105   K 4.3 06/01/2012 1116   CL 101 08/02/2012 0800   CL 99 06/01/2012 1116   CO2 22 09/13/2012 1105   CO2 29 06/01/2012 1116   BUN 11.6 09/13/2012 1105   BUN 10 06/01/2012 1116   CREATININE 0.8 09/13/2012 1105   CREATININE 0.66 06/01/2012 1116   CREATININE 0.73 03/28/2012 0850      Component Value Date/Time   CALCIUM 9.0 09/13/2012 1105   CALCIUM 9.1 06/01/2012 1116   ALKPHOS 105 09/13/2012 1105   ALKPHOS 70 06/01/2012 1116   AST 19 09/13/2012 1105   AST 15 06/01/2012 1116   ALT 12 09/13/2012 1105   ALT 9 06/01/2012 1116   BILITOT 0.22 09/13/2012 1105   BILITOT 0.5 06/01/2012 1116       RADIOGRAPHIC STUDIES: Ct Chest W Contrast  08/23/2012    *RADIOLOGY REPORT*  Clinical Data:  Lung cancer status post radiation therapy. Chemotherapy in progress.  Cough, nausea and constipation.  CT CHEST, ABDOMEN AND PELVIS WITH CONTRAST  Technique:  Multidetector CT imaging of the chest, abdomen and pelvis was performed following the standard protocol during bolus administration of intravenous contrast.  Contrast: OMNIPAQUE IOHEXOL 300 MG/ML  SOLN  Comparison:  CT of the abdomen and pelvis 03/01/2010.    CT CHEST  Findings:  Mediastinum: Right internal jugular single lumen Port-A-Cath with tip terminating in the right atrium. Heart size is normal. There is no significant pericardial fluid, thickening or pericardial calcification. There is atherosclerosis of the thoracic aorta, the great vessels of the mediastinum and the coronary arteries, including calcified atherosclerotic plaque in the left main, left anterior descending, left circumflex and right coronary arteries. Extensive calcifications of  the mitral annulus. No pathologically enlarged mediastinal or hilar lymph nodes. Abundant amorphous soft tissue in the left suprahilar region corresponds to an area of treated lymphadenopathy based on comparison with prior examinations.  There is also some amorphous soft tissue throughout the middle mediastinum, particularly in the subcarinal and low left paratracheal stations, corresponding with treated nodal tissue based on comparison with the prior examination.  Borderline enlarged low right paratracheal lymph node.  Esophagus is unremarkable in appearance.  Separate origin of the left vertebral artery directly off the aortic arch (normal anatomical variant) incidentally noted. Lower cervical and supraclavicular lymph nodes noted bilaterally are conspicuous, but do not meet CT criteria for enlargement at this time.  Lungs/Pleura: Compared to the prior examination, the large mass in the left upper lobe has largely resolved, now with an amorphous area of residual  architectural distortion which extends from the pleural surface anterolaterally retrograde toward the left hilum, contiguous with the amorphous soft tissue and hilar regions mentioned above.  Throughout this area there is surrounding ground- glass attenuation, septal thickening and architectural distortion. Several areas of mild soft tissue thickening are noted throughout this area of architectural distortion, without a well-defined nodule or mass on today's examination.  The largest bulk of tissue is identified on image 13 of series 2 along the pleural surface where currently measures approximately 2.1 x 1.3 cm.  Small amount of scarring in the inferior aspect of the right upper lobe adjacent to the minor fissure and in the medial segment of the right middle lobe, unchanged compared to the prior examination.  No acute consolidative air space disease.  No pleural effusions.  Musculoskeletal: Focal area of mild cortical irregularity and periosteal reaction in the lateral aspect of the left fifth rib is nonspecific, but likely represent an area of radiation osteitis. Compared to the prior examination there has been increasing sclerosis and further compression of T6, now with approximately 15% loss of anterior vertebral body height. The sclerosis of T6 is slightly asymmetric involving the left side of the vertebral body posteriorly to the a greater extent than the remainder of the vertebral body.  IMPRESSION:  1.  Today's study demonstrates a positive response to therapy, with a near complete regression of the left upper lobe mass (now with only residual areas of nodular architectural distortion), as well as progression of the previously noted left hilar and mediastinal lymphadenopathy.  There continues to be extensive amorphous soft tissue throughout the previously noted mediastinal and hilar nodal stations, which could reflect residual disease.  Continued attention on follow-up studies is recommended.  Additionally,  the supraclavicular and low cervical lymphadenopathy appears improved. 2.  Further progression of sclerosis and compression at T6. Whether not this is a benign compression fracture, or fracture at site of prior metastasis remains uncertain, however, given the mild hypermetabolism in this region on the prior PET CT 05/24/2012, a pathologic fracture is suspected.  Continued attention on follow-up studies is recommended. 3. Atherosclerosis, including left main and three-vessel coronary artery disease.    CT ABDOMEN AND PELVIS  Findings:  Abdomen/Pelvis: Minimal intrahepatic biliary ductal dilatation appears to be chronic and is similar to the prior study.  No dilatation of the common bile duct is noted.  The patient is status post cholecystectomy.  No focal cystic or solid hepatic lesions are identified.  Atrophy of the pancreas with multifocal pancreatic parenchymal calcifications, compatible with chronic pancreatitis. The appearance of the spleen is unremarkable.  There are numerous subcentimeter low attenuation lesions scattered  throughout the kidneys bilaterally which are too small to definitively characterize, but are favored to represent cysts.  1.8 x 1.1 cm left adrenal nodule and 1.5 x 1.1 cm right adrenal nodule appear increased compared to prior examinations and are concerning for potential adrenal metastases.  Extensive atherosclerosis throughout the abdominal and pelvic vasculature, without definite aneurysm or dissection.  No significant volume of ascites.  No pneumoperitoneum.  No pathologic distension of small bowel.  However, there is some circumferential bowel wall thickening in the region of the distal ileum.  Status post hysterectomy.  Ovaries are not confidently identified may be surgically absent or atrophic.  Urinary bladder is unremarkable in appearance.  Musculoskeletal: At this site of prior hypermetabolism in the anterior aspect of the right ilium there is now a more clearly defined lytic  lesion measuring approximately 1.6 cm in diameter (image 91 of series 2), compatible with a metastatic lesion. In addition, image 88 of series 2 demonstrates a new mildly sclerotic lesion measuring 1 cm in diameter in the right ilium of above this lesion.  There are also areas of irregular sclerosis which appear to be developing immediately lateral to the sacroiliac joints bilaterally, which is nonspecific, but may be concerning for additional sites of skeletal metastasis.  IMPRESSION:  1.  Interval enlargement of bilateral adrenal nodules concerning for adrenal metastases. 2.  Previously described focus of hypermetabolism in the right ilium corresponds with an aggressive appearing lesion on today's examination, compatible with a skeletal metastasis.  There are also new areas of sclerosis immediately above this within the right ilium, and immediately lateral to the sacroiliac joints bilaterally, which may represent additional skeletal metastases. 3.  Interval development of some circumferential bowel wall thickening throughout the distal ileum.  This is of uncertain etiology and significance, but correlation for signs and symptoms of enteritis is recommended. 4.  Additional incidental findings, as above, similar to prior examinations.   Original Report Authenticated By: Trudie Reed, M.D.   ASSESSMENT AND PLAN: This is a very pleasant 79 years old white female with extensive stage small cell lung cancer status post 3 cycles of systemic chemotherapy with carboplatin and etoposide with significant improvement in her disease in the chest but mild interval enlargement of bilateral adrenal nodules that need to be monitored closely. The patient was discussed with and seen by Dr. Arbutus Ped. She will proceed with cycle #4 today. She will continue with weekly labs as scheduled. She will follow up with Dr. Arbutus Ped in 3 weeks with a restaging CT scan of her chest, abdomen and pelvis to re-evaluate her disease. Refill  prescriptions for Compazine and Emla cream were sent to her pharmacy of record via [facility].  Dotty Gonzalo E, PA-C   She was advised to call immediately if she has any concerning symptoms in the interval.  All questions were answered. The patient knows to call the clinic with any problems, questions or concerns. We can certainly see the patient much sooner if necessary.  ADDENDUM: Hematology/Oncology Attending: I have the face to face encounter with the patient today. I recommended her care plan. The patient has extensive stage small cell lung cancer and currently undergoing systemic chemotherapy with carboplatin and etoposide status post 3 cycles. She is tolerating her treatment fairly well with no significant adverse effects. We'll proceed with cycle #4 today as scheduled. The patient would come back for follow up visit in 3 weeks with repeat CT scan of the chest, abdomen and pelvis for restaging of her disease.  Lajuana Matte., MD 09/13/2012

## 2012-09-16 ENCOUNTER — Ambulatory Visit (HOSPITAL_BASED_OUTPATIENT_CLINIC_OR_DEPARTMENT_OTHER): Payer: Medicare Other

## 2012-09-16 VITALS — BP 145/65 | HR 88 | Temp 97.4°F | Resp 20

## 2012-09-16 DIAGNOSIS — C3490 Malignant neoplasm of unspecified part of unspecified bronchus or lung: Secondary | ICD-10-CM

## 2012-09-16 DIAGNOSIS — Z5189 Encounter for other specified aftercare: Secondary | ICD-10-CM

## 2012-09-16 DIAGNOSIS — C3410 Malignant neoplasm of upper lobe, unspecified bronchus or lung: Secondary | ICD-10-CM

## 2012-09-16 MED ORDER — PEGFILGRASTIM INJECTION 6 MG/0.6ML
6.0000 mg | Freq: Once | SUBCUTANEOUS | Status: AC
  Administered 2012-09-16: 6 mg via SUBCUTANEOUS

## 2012-09-19 ENCOUNTER — Encounter: Payer: Self-pay | Admitting: Oncology

## 2012-09-20 ENCOUNTER — Other Ambulatory Visit (HOSPITAL_BASED_OUTPATIENT_CLINIC_OR_DEPARTMENT_OTHER): Payer: Medicare Other | Admitting: Lab

## 2012-09-20 DIAGNOSIS — C3490 Malignant neoplasm of unspecified part of unspecified bronchus or lung: Secondary | ICD-10-CM

## 2012-09-20 LAB — CBC WITH DIFFERENTIAL/PLATELET
BASO%: 0.4 % (ref 0.0–2.0)
HCT: 28.4 % — ABNORMAL LOW (ref 34.8–46.6)
MCHC: 33.6 g/dL (ref 31.5–36.0)
MONO#: 0.2 10*3/uL (ref 0.1–0.9)
NEUT#: 2.8 10*3/uL (ref 1.5–6.5)
RBC: 3.12 10*6/uL — ABNORMAL LOW (ref 3.70–5.45)
WBC: 3.7 10*3/uL — ABNORMAL LOW (ref 3.9–10.3)
lymph#: 0.7 10*3/uL — ABNORMAL LOW (ref 0.9–3.3)

## 2012-09-20 LAB — COMPREHENSIVE METABOLIC PANEL (CC13)
ALT: 12 U/L (ref 0–55)
Albumin: 2.9 g/dL — ABNORMAL LOW (ref 3.5–5.0)
CO2: 23 mEq/L (ref 22–29)
Calcium: 8.3 mg/dL — ABNORMAL LOW (ref 8.4–10.4)
Chloride: 103 mEq/L (ref 98–109)
Sodium: 134 mEq/L — ABNORMAL LOW (ref 136–145)
Total Protein: 6 g/dL — ABNORMAL LOW (ref 6.4–8.3)

## 2012-09-21 ENCOUNTER — Ambulatory Visit
Admission: RE | Admit: 2012-09-21 | Discharge: 2012-09-21 | Disposition: A | Discharge status: Home / Self Care | Payer: Medicare Other | Source: Ambulatory Visit | Attending: Radiation Oncology | Admitting: Radiation Oncology

## 2012-09-21 NOTE — Progress Notes (Signed)
Misty Ball here for follow up after treatment to her left lung.  She denies pain.  She has an occasional cough.  She denies shortness of breath.  She has an injection on Saturday and on Sunday at 3:00 she was very sick to her stomach.  She vomited off and on until Monday night.  She is weak know.  She is taking compazine which helps.  Her skin is intact on her left side.  She is using radiaplex gel.

## 2012-09-21 NOTE — Progress Notes (Signed)
Radiation Oncology         [phone] ________________________________  Name: Misty Ball MRN: [id number]  Date: 09/21/2012  DOB: December 17, 1931  Follow-Up Visit Note  CC: [name], MD  Nyoka Cowden, MD  Diagnosis:   Extensive stage small cell lung cancer  Interval Since Last Radiation:  One month   Narrative:  The patient returns today for routine follow-up. The patient is continuing with chemotherapy. This has gone up reasonably well. The patient did have some nausea and diarrhea earlier this week which took a little bit of a toll on her. This has improved.   She denies any shortness of breath.  Her antinausea medication is working satisfactorily.                          ALLERGIES:  is allergic to sulfa antibiotics.  Meds: Current Outpatient Prescriptions  Medication Sig Dispense Refill  . aspirin EC 81 MG tablet Take 81 mg by mouth daily.      . clonazePAM (KLONOPIN) 0.5 MG tablet Take 2 tablets (1 mg total) by mouth at bedtime.  90 tablet  1  . doxepin (SINEQUAN) 25 MG capsule Take 1 capsule (25 mg total) by mouth at bedtime.  90 capsule  3  . hyaluronate sodium (RADIAPLEXRX) GEL Apply 1 application topically 2 (two) times daily.      . Insulin Glargine (LANTUS SOLOSTAR) 100 UNIT/ML SOPN Inject 0.42 mLs into the skin daily.  5 pen  11  . levothyroxine (SYNTHROID, LEVOTHROID) 112 MCG tablet Take 1 tablet (112 mcg total) by mouth daily.  90 tablet  3  . lidocaine-prilocaine (EMLA) cream Apply topically as needed. Apply to port 1 hour before chemotherapy  30 g  0  . lidocaine-prilocaine (EMLA) cream Apply topically as needed.  30 g  0  . lipase/protease/amylase (CREON-10/PANCREASE) 12000 UNITS CPEP Take 1 capsule by mouth 3 (three) times daily before meals.      . prochlorperazine (COMPAZINE) 10 MG tablet Take 1 tablet (10 mg total) by mouth every 6 (six) hours as needed.  60 tablet  0  . triamcinolone cream (KENALOG) 0.1 % Apply 1 application topically 2 (two) times  daily.  454 g  4  . HYDROcodone-ibuprofen (VICOPROFEN) 7.5-200 MG per tablet Take 1 tablet by mouth every 8 (eight) hours as needed for pain.  30 tablet  0  . oxyCODONE-acetaminophen (PERCOCET/ROXICET) 5-325 MG per tablet Take 1 tablet by mouth every 6 (six) hours as needed for pain.  100 tablet  0  . traMADol (ULTRAM) 50 MG tablet Take 1 tablet (50 mg total) by mouth every 8 (eight) hours as needed for pain.  30 tablet  0   No current facility-administered medications for this encounter.    Physical Findings: The patient is in no acute distress. Patient is alert and oriented.  height is 5\' 3"  (1.6 m) and weight is 146 lb 6.4 oz (66.407 kg). Her temperature is 97.5 F (36.4 C). Her blood pressure is 131/52 and her pulse is 89. Her oxygen saturation is 100%. .   General: Well-developed, in no acute distress HEENT: Normocephalic, atraumatic Cardiovascular: Regular rate and rhythm Respiratory: Clear to auscultation bilaterally GI: Soft, nontender, normal bowel sounds Extremities: No edema present   Lab Findings: Lab Results  Component Value Date   WBC 3.7* 09/20/2012   HGB 9.5* 09/20/2012   HCT 28.4* 09/20/2012   MCV 91.1 09/20/2012   PLT 180 09/20/2012  Radiographic Findings: Ct Chest W Contrast  08/23/2012   *RADIOLOGY REPORT*  Clinical Data:  Lung cancer status post radiation therapy. Chemotherapy in progress.  Cough, nausea and constipation.  CT CHEST, ABDOMEN AND PELVIS WITH CONTRAST  Technique:  Multidetector CT imaging of the chest, abdomen and pelvis was performed following the standard protocol during bolus administration of intravenous contrast.  Contrast: OMNIPAQUE IOHEXOL 300 MG/ML  SOLN  Comparison:  CT of the abdomen and pelvis 03/01/2010.  CT CHEST  Findings:  Mediastinum: Right internal jugular single lumen Port-A-Cath with tip terminating in the right atrium. Heart size is normal. There is no significant pericardial fluid, thickening or pericardial calcification.  There is atherosclerosis of the thoracic aorta, the great vessels of the mediastinum and the coronary arteries, including calcified atherosclerotic plaque in the left main, left anterior descending, left circumflex and right coronary arteries. Extensive calcifications of the mitral annulus. No pathologically enlarged mediastinal or hilar lymph nodes. Abundant amorphous soft tissue in the left suprahilar region corresponds to an area of treated lymphadenopathy based on comparison with prior examinations.  There is also some amorphous soft tissue throughout the middle mediastinum, particularly in the subcarinal and low left paratracheal stations, corresponding with treated nodal tissue based on comparison with the prior examination.  Borderline enlarged low right paratracheal lymph node.  Esophagus is unremarkable in appearance.  Separate origin of the left vertebral artery directly off the aortic arch (normal anatomical variant) incidentally noted. Lower cervical and supraclavicular lymph nodes noted bilaterally are conspicuous, but do not meet CT criteria for enlargement at this time.  Lungs/Pleura: Compared to the prior examination, the large mass in the left upper lobe has largely resolved, now with an amorphous area of residual architectural distortion which extends from the pleural surface anterolaterally retrograde toward the left hilum, contiguous with the amorphous soft tissue and hilar regions mentioned above.  Throughout this area there is surrounding ground- glass attenuation, septal thickening and architectural distortion. Several areas of mild soft tissue thickening are noted throughout this area of architectural distortion, without a well-defined nodule or mass on today's examination.  The largest bulk of tissue is identified on image 13 of series 2 along the pleural surface where currently measures approximately 2.1 x 1.3 cm.  Small amount of scarring in the inferior aspect of the right upper lobe  adjacent to the minor fissure and in the medial segment of the right middle lobe, unchanged compared to the prior examination.  No acute consolidative air space disease.  No pleural effusions.  Musculoskeletal: Focal area of mild cortical irregularity and periosteal reaction in the lateral aspect of the left fifth rib is nonspecific, but likely represent an area of radiation osteitis. Compared to the prior examination there has been increasing sclerosis and further compression of T6, now with approximately 15% loss of anterior vertebral body height. The sclerosis of T6 is slightly asymmetric involving the left side of the vertebral body posteriorly to the a greater extent than the remainder of the vertebral body.  IMPRESSION:  1.  Today's study demonstrates a positive response to therapy, with a near complete regression of the left upper lobe mass (now with only residual areas of nodular architectural distortion), as well as progression of the previously noted left hilar and mediastinal lymphadenopathy.  There continues to be extensive amorphous soft tissue throughout the previously noted mediastinal and hilar nodal stations, which could reflect residual disease.  Continued attention on follow-up studies is recommended.  Additionally, the supraclavicular and low  cervical lymphadenopathy appears improved. 2.  Further progression of sclerosis and compression at T6. Whether not this is a benign compression fracture, or fracture at site of prior metastasis remains uncertain, however, given the mild hypermetabolism in this region on the prior PET CT 05/24/2012, a pathologic fracture is suspected.  Continued attention on follow-up studies is recommended. 3. Atherosclerosis, including left main and three-vessel coronary artery disease.  CT ABDOMEN AND PELVIS  Findings:  Abdomen/Pelvis: Minimal intrahepatic biliary ductal dilatation appears to be chronic and is similar to the prior study.  No dilatation of the common bile  duct is noted.  The patient is status post cholecystectomy.  No focal cystic or solid hepatic lesions are identified.  Atrophy of the pancreas with multifocal pancreatic parenchymal calcifications, compatible with chronic pancreatitis. The appearance of the spleen is unremarkable.  There are numerous subcentimeter low attenuation lesions scattered throughout the kidneys bilaterally which are too small to definitively characterize, but are favored to represent cysts.  1.8 x 1.1 cm left adrenal nodule and 1.5 x 1.1 cm right adrenal nodule appear increased compared to prior examinations and are concerning for potential adrenal metastases.  Extensive atherosclerosis throughout the abdominal and pelvic vasculature, without definite aneurysm or dissection.  No significant volume of ascites.  No pneumoperitoneum.  No pathologic distension of small bowel.  However, there is some circumferential bowel wall thickening in the region of the distal ileum.  Status post hysterectomy.  Ovaries are not confidently identified may be surgically absent or atrophic.  Urinary bladder is unremarkable in appearance.  Musculoskeletal: At this site of prior hypermetabolism in the anterior aspect of the right ilium there is now a more clearly defined lytic lesion measuring approximately 1.6 cm in diameter (image 91 of series 2), compatible with a metastatic lesion. In addition, image 88 of series 2 demonstrates a new mildly sclerotic lesion measuring 1 cm in diameter in the right ilium of above this lesion.  There are also areas of irregular sclerosis which appear to be developing immediately lateral to the sacroiliac joints bilaterally, which is nonspecific, but may be concerning for additional sites of skeletal metastasis.  IMPRESSION:  1.  Interval enlargement of bilateral adrenal nodules concerning for adrenal metastases. 2.  Previously described focus of hypermetabolism in the right ilium corresponds with an aggressive appearing lesion  on today's examination, compatible with a skeletal metastasis.  There are also new areas of sclerosis immediately above this within the right ilium, and immediately lateral to the sacroiliac joints bilaterally, which may represent additional skeletal metastases. 3.  Interval development of some circumferential bowel wall thickening throughout the distal ileum.  This is of uncertain etiology and significance, but correlation for signs and symptoms of enteritis is recommended. 4.  Additional incidental findings, as above, similar to prior examinations.   Original Report Authenticated By: Trudie Reed, M.D.   Ct Abdomen Pelvis W Contrast  08/23/2012   *RADIOLOGY REPORT*  Clinical Data:  Lung cancer status post radiation therapy. Chemotherapy in progress.  Cough, nausea and constipation.  CT CHEST, ABDOMEN AND PELVIS WITH CONTRAST  Technique:  Multidetector CT imaging of the chest, abdomen and pelvis was performed following the standard protocol during bolus administration of intravenous contrast.  Contrast: OMNIPAQUE IOHEXOL 300 MG/ML  SOLN  Comparison:  CT of the abdomen and pelvis 03/01/2010.  CT CHEST  Findings:  Mediastinum: Right internal jugular single lumen Port-A-Cath with tip terminating in the right atrium. Heart size is normal. There is no significant pericardial fluid,  thickening or pericardial calcification. There is atherosclerosis of the thoracic aorta, the great vessels of the mediastinum and the coronary arteries, including calcified atherosclerotic plaque in the left main, left anterior descending, left circumflex and right coronary arteries. Extensive calcifications of the mitral annulus. No pathologically enlarged mediastinal or hilar lymph nodes. Abundant amorphous soft tissue in the left suprahilar region corresponds to an area of treated lymphadenopathy based on comparison with prior examinations.  There is also some amorphous soft tissue throughout the middle mediastinum,  particularly in the subcarinal and low left paratracheal stations, corresponding with treated nodal tissue based on comparison with the prior examination.  Borderline enlarged low right paratracheal lymph node.  Esophagus is unremarkable in appearance.  Separate origin of the left vertebral artery directly off the aortic arch (normal anatomical variant) incidentally noted. Lower cervical and supraclavicular lymph nodes noted bilaterally are conspicuous, but do not meet CT criteria for enlargement at this time.  Lungs/Pleura: Compared to the prior examination, the large mass in the left upper lobe has largely resolved, now with an amorphous area of residual architectural distortion which extends from the pleural surface anterolaterally retrograde toward the left hilum, contiguous with the amorphous soft tissue and hilar regions mentioned above.  Throughout this area there is surrounding ground- glass attenuation, septal thickening and architectural distortion. Several areas of mild soft tissue thickening are noted throughout this area of architectural distortion, without a well-defined nodule or mass on today's examination.  The largest bulk of tissue is identified on image 13 of series 2 along the pleural surface where currently measures approximately 2.1 x 1.3 cm.  Small amount of scarring in the inferior aspect of the right upper lobe adjacent to the minor fissure and in the medial segment of the right middle lobe, unchanged compared to the prior examination.  No acute consolidative air space disease.  No pleural effusions.  Musculoskeletal: Focal area of mild cortical irregularity and periosteal reaction in the lateral aspect of the left fifth rib is nonspecific, but likely represent an area of radiation osteitis. Compared to the prior examination there has been increasing sclerosis and further compression of T6, now with approximately 15% loss of anterior vertebral body height. The sclerosis of T6 is slightly  asymmetric involving the left side of the vertebral body posteriorly to the a greater extent than the remainder of the vertebral body.  IMPRESSION:  1.  Today's study demonstrates a positive response to therapy, with a near complete regression of the left upper lobe mass (now with only residual areas of nodular architectural distortion), as well as progression of the previously noted left hilar and mediastinal lymphadenopathy.  There continues to be extensive amorphous soft tissue throughout the previously noted mediastinal and hilar nodal stations, which could reflect residual disease.  Continued attention on follow-up studies is recommended.  Additionally, the supraclavicular and low cervical lymphadenopathy appears improved. 2.  Further progression of sclerosis and compression at T6. Whether not this is a benign compression fracture, or fracture at site of prior metastasis remains uncertain, however, given the mild hypermetabolism in this region on the prior PET CT 05/24/2012, a pathologic fracture is suspected.  Continued attention on follow-up studies is recommended. 3. Atherosclerosis, including left main and three-vessel coronary artery disease.  CT ABDOMEN AND PELVIS  Findings:  Abdomen/Pelvis: Minimal intrahepatic biliary ductal dilatation appears to be chronic and is similar to the prior study.  No dilatation of the common bile duct is noted.  The patient is status post cholecystectomy.  No focal cystic or solid hepatic lesions are identified.  Atrophy of the pancreas with multifocal pancreatic parenchymal calcifications, compatible with chronic pancreatitis. The appearance of the spleen is unremarkable.  There are numerous subcentimeter low attenuation lesions scattered throughout the kidneys bilaterally which are too small to definitively characterize, but are favored to represent cysts.  1.8 x 1.1 cm left adrenal nodule and 1.5 x 1.1 cm right adrenal nodule appear increased compared to prior  examinations and are concerning for potential adrenal metastases.  Extensive atherosclerosis throughout the abdominal and pelvic vasculature, without definite aneurysm or dissection.  No significant volume of ascites.  No pneumoperitoneum.  No pathologic distension of small bowel.  However, there is some circumferential bowel wall thickening in the region of the distal ileum.  Status post hysterectomy.  Ovaries are not confidently identified may be surgically absent or atrophic.  Urinary bladder is unremarkable in appearance.  Musculoskeletal: At this site of prior hypermetabolism in the anterior aspect of the right ilium there is now a more clearly defined lytic lesion measuring approximately 1.6 cm in diameter (image 91 of series 2), compatible with a metastatic lesion. In addition, image 88 of series 2 demonstrates a new mildly sclerotic lesion measuring 1 cm in diameter in the right ilium of above this lesion.  There are also areas of irregular sclerosis which appear to be developing immediately lateral to the sacroiliac joints bilaterally, which is nonspecific, but may be concerning for additional sites of skeletal metastasis.  IMPRESSION:  1.  Interval enlargement of bilateral adrenal nodules concerning for adrenal metastases. 2.  Previously described focus of hypermetabolism in the right ilium corresponds with an aggressive appearing lesion on today's examination, compatible with a skeletal metastasis.  There are also new areas of sclerosis immediately above this within the right ilium, and immediately lateral to the sacroiliac joints bilaterally, which may represent additional skeletal metastases. 3.  Interval development of some circumferential bowel wall thickening throughout the distal ileum.  This is of uncertain etiology and significance, but correlation for signs and symptoms of enteritis is recommended. 4.  Additional incidental findings, as above, similar to prior examinations.   Original Report  Authenticated By: Trudie Reed, M.D.    Impression:    The patient is continuing with chemotherapy for her diagnosis of small cell lung cancer. She completed palliative radiotherapy to the left lung on 08/14/2012. She has done well in this regard over the last month. No complaints of esophagitis today.  Plan:   The patient will followup in our clinic in 3 months. I discussed possible additional roles for radiation treatment including the issue of prophylactic cranial irradiation as well as treating other distant sites. I will followup on her upcoming CT imaging as well as her progress with chemotherapy through Dr. Arbutus Ped, and I can certainly see her sooner if necessary.   Radene Gunning, M.D., Ph.D.

## 2012-09-26 ENCOUNTER — Other Ambulatory Visit: Payer: Self-pay | Admitting: Certified Registered Nurse Anesthetist

## 2012-09-27 ENCOUNTER — Encounter (HOSPITAL_COMMUNITY): Payer: Self-pay

## 2012-09-27 ENCOUNTER — Other Ambulatory Visit (HOSPITAL_BASED_OUTPATIENT_CLINIC_OR_DEPARTMENT_OTHER): Payer: Medicare Other | Admitting: Lab

## 2012-09-27 ENCOUNTER — Ambulatory Visit (HOSPITAL_COMMUNITY)
Admission: RE | Admit: 2012-09-27 | Discharge: 2012-09-27 | Disposition: A | Discharge status: Home / Self Care | Payer: Medicare Other | Source: Ambulatory Visit | Attending: Physician Assistant | Admitting: Physician Assistant

## 2012-09-27 DIAGNOSIS — C3490 Malignant neoplasm of unspecified part of unspecified bronchus or lung: Secondary | ICD-10-CM | POA: Insufficient documentation

## 2012-09-27 DIAGNOSIS — K6389 Other specified diseases of intestine: Secondary | ICD-10-CM | POA: Insufficient documentation

## 2012-09-27 DIAGNOSIS — I2510 Atherosclerotic heart disease of native coronary artery without angina pectoris: Secondary | ICD-10-CM | POA: Insufficient documentation

## 2012-09-27 DIAGNOSIS — E278 Other specified disorders of adrenal gland: Secondary | ICD-10-CM | POA: Insufficient documentation

## 2012-09-27 DIAGNOSIS — K5710 Diverticulosis of small intestine without perforation or abscess without bleeding: Secondary | ICD-10-CM | POA: Insufficient documentation

## 2012-09-27 DIAGNOSIS — M948X9 Other specified disorders of cartilage, unspecified sites: Secondary | ICD-10-CM | POA: Insufficient documentation

## 2012-09-27 DIAGNOSIS — I700 Atherosclerosis of aorta: Secondary | ICD-10-CM | POA: Insufficient documentation

## 2012-09-27 DIAGNOSIS — N289 Disorder of kidney and ureter, unspecified: Secondary | ICD-10-CM | POA: Insufficient documentation

## 2012-09-27 LAB — COMPREHENSIVE METABOLIC PANEL (CC13)
ALT: 14 U/L (ref 0–55)
CO2: 23 mEq/L (ref 22–29)
Sodium: 139 mEq/L (ref 136–145)
Total Bilirubin: 0.29 mg/dL (ref 0.20–1.20)
Total Protein: 6.3 g/dL — ABNORMAL LOW (ref 6.4–8.3)

## 2012-09-27 LAB — CBC WITH DIFFERENTIAL/PLATELET
BASO%: 0.4 % (ref 0.0–2.0)
LYMPH%: 7.4 % — ABNORMAL LOW (ref 14.0–49.7)
MCHC: 33.3 g/dL (ref 31.5–36.0)
MONO#: 0.7 10*3/uL (ref 0.1–0.9)
Platelets: 107 10*3/uL — ABNORMAL LOW (ref 145–400)
RBC: 3.05 10*6/uL — ABNORMAL LOW (ref 3.70–5.45)
WBC: 17.5 10*3/uL — ABNORMAL HIGH (ref 3.9–10.3)
lymph#: 1.3 10*3/uL (ref 0.9–3.3)

## 2012-09-27 MED ORDER — IOHEXOL 300 MG/ML  SOLN
100.0000 mL | Freq: Once | INTRAMUSCULAR | Status: AC | PRN
  Administered 2012-09-27: 100 mL via INTRAVENOUS

## 2012-10-04 ENCOUNTER — Ambulatory Visit: Payer: Medicare Other | Admitting: Internal Medicine

## 2012-10-04 ENCOUNTER — Ambulatory Visit (HOSPITAL_BASED_OUTPATIENT_CLINIC_OR_DEPARTMENT_OTHER): Payer: Medicare Other

## 2012-10-04 ENCOUNTER — Other Ambulatory Visit: Payer: Medicare Other | Admitting: Lab

## 2012-10-04 ENCOUNTER — Other Ambulatory Visit: Payer: Self-pay | Admitting: *Deleted

## 2012-10-04 ENCOUNTER — Telehealth: Payer: Self-pay | Admitting: *Deleted

## 2012-10-04 ENCOUNTER — Telehealth: Payer: Self-pay | Admitting: Internal Medicine

## 2012-10-04 ENCOUNTER — Other Ambulatory Visit: Payer: Self-pay | Admitting: Oncology

## 2012-10-04 ENCOUNTER — Other Ambulatory Visit (HOSPITAL_BASED_OUTPATIENT_CLINIC_OR_DEPARTMENT_OTHER): Payer: Medicare Other | Admitting: Lab

## 2012-10-04 VITALS — BP 147/69 | HR 83 | Temp 97.8°F | Resp 18

## 2012-10-04 DIAGNOSIS — C3490 Malignant neoplasm of unspecified part of unspecified bronchus or lung: Secondary | ICD-10-CM

## 2012-10-04 DIAGNOSIS — C3410 Malignant neoplasm of upper lobe, unspecified bronchus or lung: Secondary | ICD-10-CM

## 2012-10-04 DIAGNOSIS — Z5111 Encounter for antineoplastic chemotherapy: Secondary | ICD-10-CM

## 2012-10-04 LAB — CBC WITH DIFFERENTIAL/PLATELET
BASO%: 0.8 % (ref 0.0–2.0)
Basophils Absolute: 0.1 10*3/uL (ref 0.0–0.1)
EOS%: 0.3 % (ref 0.0–7.0)
HCT: 34 % — ABNORMAL LOW (ref 34.8–46.6)
MCH: 30.4 pg (ref 25.1–34.0)
MCHC: 32.6 g/dL (ref 31.5–36.0)
MCV: 93.2 fL (ref 79.5–101.0)
MONO%: 7.7 % (ref 0.0–14.0)
NEUT%: 75.2 % (ref 38.4–76.8)
RDW: 22.8 % — ABNORMAL HIGH (ref 11.2–14.5)
lymph#: 1.6 10*3/uL (ref 0.9–3.3)

## 2012-10-04 LAB — COMPREHENSIVE METABOLIC PANEL (CC13)
AST: 18 U/L (ref 5–34)
Albumin: 3.2 g/dL — ABNORMAL LOW (ref 3.5–5.0)
Alkaline Phosphatase: 105 U/L (ref 40–150)
BUN: 11.7 mg/dL (ref 7.0–26.0)
Glucose: 179 mg/dl — ABNORMAL HIGH (ref 70–140)
Potassium: 4.1 mEq/L (ref 3.5–5.1)
Total Bilirubin: 0.34 mg/dL (ref 0.20–1.20)

## 2012-10-04 MED ORDER — ONDANSETRON 16 MG/50ML IVPB (CHCC)
16.0000 mg | Freq: Once | INTRAVENOUS | Status: AC
  Administered 2012-10-04: 16 mg via INTRAVENOUS

## 2012-10-04 MED ORDER — SODIUM CHLORIDE 0.9 % IV SOLN
Freq: Once | INTRAVENOUS | Status: AC
  Administered 2012-10-04: 14:00:00 via INTRAVENOUS

## 2012-10-04 MED ORDER — CARBOPLATIN CHEMO INJECTION 600 MG/60ML
430.0000 mg | Freq: Once | INTRAVENOUS | Status: AC
  Administered 2012-10-04: 430 mg via INTRAVENOUS
  Filled 2012-10-04: qty 43

## 2012-10-04 MED ORDER — DEXAMETHASONE SODIUM PHOSPHATE 20 MG/5ML IJ SOLN
20.0000 mg | Freq: Once | INTRAMUSCULAR | Status: AC
  Administered 2012-10-04: 20 mg via INTRAVENOUS

## 2012-10-04 MED ORDER — HEPARIN SOD (PORK) LOCK FLUSH 100 UNIT/ML IV SOLN
500.0000 [IU] | Freq: Once | INTRAVENOUS | Status: AC | PRN
  Administered 2012-10-04: 500 [IU]
  Filled 2012-10-04: qty 5

## 2012-10-04 MED ORDER — SODIUM CHLORIDE 0.9 % IJ SOLN
10.0000 mL | INTRAMUSCULAR | Status: DC | PRN
  Administered 2012-10-04: 10 mL
  Filled 2012-10-04: qty 10

## 2012-10-04 MED ORDER — ETOPOSIDE CHEMO INJECTION 1 GM/50ML
120.0000 mg/m2 | Freq: Once | INTRAVENOUS | Status: AC
  Administered 2012-10-04: 200 mg via INTRAVENOUS
  Filled 2012-10-04: qty 10

## 2012-10-04 NOTE — Telephone Encounter (Signed)
gv and printed appt sched and avs to pt for Aug and Sept...pt ok and aware

## 2012-10-04 NOTE — Telephone Encounter (Signed)
Dr Donnald Garre out of the office today.  Pt had restaging scan and chemo.  Dr Truett Perna reviewed CT scan, stated that CT was stable and pt could proceed with chemotherapy today.  Informed pt.  She verbalized understanding.  Informed her that we will schedule a f/u with Dr Donnald Garre next week to further discuss the results.  SLJ

## 2012-10-04 NOTE — Telephone Encounter (Signed)
Per staff message and POF I have scheduled appts.  JMW  

## 2012-10-05 ENCOUNTER — Ambulatory Visit: Payer: Medicare Other | Admitting: Nutrition

## 2012-10-05 ENCOUNTER — Ambulatory Visit (HOSPITAL_BASED_OUTPATIENT_CLINIC_OR_DEPARTMENT_OTHER): Payer: Medicare Other

## 2012-10-05 VITALS — BP 113/60 | HR 87 | Temp 98.6°F

## 2012-10-05 DIAGNOSIS — C3490 Malignant neoplasm of unspecified part of unspecified bronchus or lung: Secondary | ICD-10-CM

## 2012-10-05 DIAGNOSIS — Z5111 Encounter for antineoplastic chemotherapy: Secondary | ICD-10-CM

## 2012-10-05 DIAGNOSIS — C3410 Malignant neoplasm of upper lobe, unspecified bronchus or lung: Secondary | ICD-10-CM

## 2012-10-05 MED ORDER — SODIUM CHLORIDE 0.9 % IJ SOLN
10.0000 mL | INTRAMUSCULAR | Status: DC | PRN
  Administered 2012-10-05: 10 mL
  Filled 2012-10-05: qty 10

## 2012-10-05 MED ORDER — HEPARIN SOD (PORK) LOCK FLUSH 100 UNIT/ML IV SOLN
500.0000 [IU] | Freq: Once | INTRAVENOUS | Status: AC | PRN
  Administered 2012-10-05: 500 [IU]
  Filled 2012-10-05: qty 5

## 2012-10-05 MED ORDER — SODIUM CHLORIDE 0.9 % IV SOLN
Freq: Once | INTRAVENOUS | Status: AC
  Administered 2012-10-05: 15:00:00 via INTRAVENOUS

## 2012-10-05 MED ORDER — DEXAMETHASONE SODIUM PHOSPHATE 10 MG/ML IJ SOLN
10.0000 mg | Freq: Once | INTRAMUSCULAR | Status: AC
  Administered 2012-10-05: 10 mg via INTRAVENOUS

## 2012-10-05 MED ORDER — SODIUM CHLORIDE 0.9 % IV SOLN
120.0000 mg/m2 | Freq: Once | INTRAVENOUS | Status: AC
  Administered 2012-10-05: 200 mg via INTRAVENOUS
  Filled 2012-10-05: qty 10

## 2012-10-05 MED ORDER — ONDANSETRON 8 MG/50ML IVPB (CHCC)
8.0000 mg | Freq: Once | INTRAVENOUS | Status: AC
  Administered 2012-10-05: 8 mg via INTRAVENOUS

## 2012-10-05 NOTE — Patient Instructions (Addendum)
[facility] Discharge Instructions for Patients Receiving Chemotherapy  Today you received the following chemotherapy agents etoposide  To help prevent nausea and vomiting after your treatment, we encourage you to take your nausea medication as needed   If you develop nausea and vomiting that is not controlled by your nausea medication, call the clinic.   BELOW ARE SYMPTOMS THAT SHOULD BE REPORTED IMMEDIATELY:  *FEVER GREATER THAN 100.5 F  *CHILLS WITH OR WITHOUT FEVER  NAUSEA AND VOMITING THAT IS NOT CONTROLLED WITH YOUR NAUSEA MEDICATION  *UNUSUAL SHORTNESS OF BREATH  *UNUSUAL BRUISING OR BLEEDING  TENDERNESS IN MOUTH AND THROAT WITH OR WITHOUT PRESENCE OF ULCERS  *URINARY PROBLEMS  *BOWEL PROBLEMS  UNUSUAL RASH Items with * indicate a potential emergency and should be followed up as soon as possible.  Feel free to call the clinic you have any questions or concerns. The clinic phone number is [phone].    

## 2012-10-05 NOTE — Progress Notes (Signed)
Patient reports she is doing well.  She has a good appetite and states she is eating well.  She has not been weighed since August 14 when she weighed 146.4 pounds.  Overall, her weight stays within a couple pounds.  Patient now consuming Ensure Plus twice daily.  Nutrition diagnosis:  Food and Nutrition Related Knowledge Deficit, improved.  Intervention:   Continue Ensure Plus twice daily as needed between meals. Continue nausea medications as prescribed.  Monitoring, Evaluation, Goals: Patient will tolerate oral diet with Ensure Plus twice daily to promote weight maintenance.  Next Visit: Thursday, September 18 in chemotherapy.

## 2012-10-06 ENCOUNTER — Ambulatory Visit (HOSPITAL_BASED_OUTPATIENT_CLINIC_OR_DEPARTMENT_OTHER): Payer: Medicare Other

## 2012-10-06 VITALS — BP 142/74 | HR 71 | Temp 96.7°F | Resp 18

## 2012-10-06 DIAGNOSIS — Z5111 Encounter for antineoplastic chemotherapy: Secondary | ICD-10-CM

## 2012-10-06 DIAGNOSIS — C3410 Malignant neoplasm of upper lobe, unspecified bronchus or lung: Secondary | ICD-10-CM

## 2012-10-06 DIAGNOSIS — C3490 Malignant neoplasm of unspecified part of unspecified bronchus or lung: Secondary | ICD-10-CM

## 2012-10-06 MED ORDER — ONDANSETRON 8 MG/50ML IVPB (CHCC)
8.0000 mg | Freq: Once | INTRAVENOUS | Status: AC
  Administered 2012-10-06: 8 mg via INTRAVENOUS

## 2012-10-06 MED ORDER — SODIUM CHLORIDE 0.9 % IJ SOLN
10.0000 mL | INTRAMUSCULAR | Status: DC | PRN
  Administered 2012-10-06: 10 mL
  Filled 2012-10-06: qty 10

## 2012-10-06 MED ORDER — SODIUM CHLORIDE 0.9 % IV SOLN
Freq: Once | INTRAVENOUS | Status: AC
  Administered 2012-10-06: 10:00:00 via INTRAVENOUS

## 2012-10-06 MED ORDER — SODIUM CHLORIDE 0.9 % IV SOLN
120.0000 mg/m2 | Freq: Once | INTRAVENOUS | Status: AC
  Administered 2012-10-06: 200 mg via INTRAVENOUS
  Filled 2012-10-06: qty 10

## 2012-10-06 MED ORDER — DEXAMETHASONE SODIUM PHOSPHATE 10 MG/ML IJ SOLN
10.0000 mg | Freq: Once | INTRAMUSCULAR | Status: AC
  Administered 2012-10-06: 10 mg via INTRAVENOUS

## 2012-10-06 MED ORDER — HEPARIN SOD (PORK) LOCK FLUSH 100 UNIT/ML IV SOLN
500.0000 [IU] | Freq: Once | INTRAVENOUS | Status: AC | PRN
  Administered 2012-10-06: 500 [IU]
  Filled 2012-10-06: qty 5

## 2012-10-06 NOTE — Patient Instructions (Addendum)
[facility] Discharge Instructions for Patients Receiving Chemotherapy  Today you received the following chemotherapy agents :  Etoposide.  To help prevent nausea and vomiting after your treatment, we encourage you to take your nausea medication as instructed by your physician.   If you develop nausea and vomiting that is not controlled by your nausea medication, call the clinic.   BELOW ARE SYMPTOMS THAT SHOULD BE REPORTED IMMEDIATELY:  *FEVER GREATER THAN 100.5 F  *CHILLS WITH OR WITHOUT FEVER  NAUSEA AND VOMITING THAT IS NOT CONTROLLED WITH YOUR NAUSEA MEDICATION  *UNUSUAL SHORTNESS OF BREATH  *UNUSUAL BRUISING OR BLEEDING  TENDERNESS IN MOUTH AND THROAT WITH OR WITHOUT PRESENCE OF ULCERS  *URINARY PROBLEMS  *BOWEL PROBLEMS  UNUSUAL RASH Items with * indicate a potential emergency and should be followed up as soon as possible.  Feel free to call the clinic you have any questions or concerns. The clinic phone number is [phone].    

## 2012-10-07 ENCOUNTER — Ambulatory Visit (HOSPITAL_BASED_OUTPATIENT_CLINIC_OR_DEPARTMENT_OTHER): Payer: Medicare Other

## 2012-10-07 VITALS — BP 128/64 | HR 81 | Temp 97.4°F

## 2012-10-07 DIAGNOSIS — C3410 Malignant neoplasm of upper lobe, unspecified bronchus or lung: Secondary | ICD-10-CM

## 2012-10-07 DIAGNOSIS — C3490 Malignant neoplasm of unspecified part of unspecified bronchus or lung: Secondary | ICD-10-CM

## 2012-10-07 MED ORDER — PEGFILGRASTIM INJECTION 6 MG/0.6ML
6.0000 mg | Freq: Once | SUBCUTANEOUS | Status: AC
  Administered 2012-10-07: 6 mg via SUBCUTANEOUS

## 2012-10-11 ENCOUNTER — Encounter: Payer: Self-pay | Admitting: Internal Medicine

## 2012-10-11 ENCOUNTER — Telehealth: Payer: Self-pay | Admitting: Internal Medicine

## 2012-10-11 ENCOUNTER — Other Ambulatory Visit (HOSPITAL_BASED_OUTPATIENT_CLINIC_OR_DEPARTMENT_OTHER): Payer: Medicare Other

## 2012-10-11 ENCOUNTER — Ambulatory Visit (HOSPITAL_BASED_OUTPATIENT_CLINIC_OR_DEPARTMENT_OTHER): Payer: Medicare Other | Admitting: Internal Medicine

## 2012-10-11 ENCOUNTER — Ambulatory Visit: Payer: Medicare Other

## 2012-10-11 VITALS — BP 124/47 | HR 88 | Temp 98.2°F | Resp 18 | Ht 63.0 in | Wt 146.0 lb

## 2012-10-11 DIAGNOSIS — C3410 Malignant neoplasm of upper lobe, unspecified bronchus or lung: Secondary | ICD-10-CM

## 2012-10-11 DIAGNOSIS — C3492 Malignant neoplasm of unspecified part of left bronchus or lung: Secondary | ICD-10-CM

## 2012-10-11 LAB — CBC WITH DIFFERENTIAL/PLATELET
Basophils Absolute: 0 10*3/uL (ref 0.0–0.1)
EOS%: 0.4 % (ref 0.0–7.0)
Eosinophils Absolute: 0 10*3/uL (ref 0.0–0.5)
HCT: 27.6 % — ABNORMAL LOW (ref 34.8–46.6)
HGB: 9.2 g/dL — ABNORMAL LOW (ref 11.6–15.9)
LYMPH%: 12.9 % — ABNORMAL LOW (ref 14.0–49.7)
MCH: 31.7 pg (ref 25.1–34.0)
MCV: 95.5 fL (ref 79.5–101.0)
MONO%: 3.4 % (ref 0.0–14.0)
NEUT#: 3.4 10*3/uL (ref 1.5–6.5)
NEUT%: 82.5 % — ABNORMAL HIGH (ref 38.4–76.8)
Platelets: 140 10*3/uL — ABNORMAL LOW (ref 145–400)
RDW: 21.3 % — ABNORMAL HIGH (ref 11.2–14.5)

## 2012-10-11 LAB — COMPREHENSIVE METABOLIC PANEL (CC13)
AST: 17 U/L (ref 5–34)
Albumin: 3.1 g/dL — ABNORMAL LOW (ref 3.5–5.0)
Alkaline Phosphatase: 116 U/L (ref 40–150)
BUN: 13.7 mg/dL (ref 7.0–26.0)
Creatinine: 0.7 mg/dL (ref 0.6–1.1)
Glucose: 239 mg/dl — ABNORMAL HIGH (ref 70–140)
Potassium: 3.7 mEq/L (ref 3.5–5.1)
Total Bilirubin: 0.83 mg/dL (ref 0.20–1.20)

## 2012-10-11 NOTE — Progress Notes (Signed)
[facility] [phone]   [phone]  OFFICE PROGRESS NOTE  [name], MD [address] [place] [address]  DIAGNOSIS: Extensive stage small cell lung cancer diagnosed in May of 2014   PRIOR THERAPY: None   CURRENT THERAPY:  1) Systemic chemotherapy with carboplatin for AUC of 5 on day 1 and etoposide at 120 mg/M2 on days 1, 2 and 3 with Neulasta support on day 4, first dose started on 07/12/2012. Status post 5 cycles, last cycle was given on 10/04/2012.  2) palliative radiotherapy to the left lung field under the care of Dr. Mitzi Hansen completed on 08/14/2012.   CHEMOTHERAPY INTENT: Palliative  CURRENT # OF CHEMOTHERAPY CYCLES: 5  CURRENT ANTIEMETICS: Zofran, dexamethasone,Compazine  CURRENT SMOKING STATUS: Former smoker, quit 5/228/2014  ORAL CHEMOTHERAPY AND CONSENT: n/a  CURRENT BISPHOSPHONATES USE: none  PAIN MANAGEMENT: Percocet  NARCOTICS INDUCED CONSTIPATION: occasional  LIVING WILL AND CODE STATUS: has a living will, DNR   INTERVAL HISTORY: Misty Ball 79 y.o. female returns to the clinic today for followup visit accompanied by [name]. The patient is feeling fine today with no specific complaints except for fatigue. She also has some weakness and more fatigue after the Neulasta injection over the weekend. She denied having any significant nausea or vomiting. She denied having any significant weight loss or night sweats. She denied having any chest pain, shortness breath, cough or hemoptysis. She had repeat CT scan of the chest, abdomen and pelvis performed after cycle #4 and she is here for evaluation and discussion of her scan results.   MEDICAL HISTORY: Past Medical History  Diagnosis Date  . Hypertension   . Pancreatitis   . Diabetes mellitus   . Thyroid disease   . lung ca dx'd 06/2012  . History of radiation therapy 07/20/2012-08/14/2012    37.5 Gy to left lung  . History of chemotherapy 07/12/2012   carboplatin and etoposide    ALLERGIES:  is allergic to sulfa antibiotics.  MEDICATIONS:  Current Outpatient Prescriptions  Medication Sig Dispense Refill  . aspirin EC 81 MG tablet Take 81 mg by mouth daily.      . clonazePAM (KLONOPIN) 0.5 MG tablet Take 2 tablets (1 mg total) by mouth at bedtime.  90 tablet  1  . doxepin (SINEQUAN) 25 MG capsule Take 1 capsule (25 mg total) by mouth at bedtime.  90 capsule  3  . hyaluronate sodium (RADIAPLEXRX) GEL Apply 1 application topically 2 (two) times daily.      . Insulin Glargine (LANTUS SOLOSTAR) 100 UNIT/ML SOPN Inject 0.42 mLs into the skin daily.  5 pen  11  . levothyroxine (SYNTHROID, LEVOTHROID) 112 MCG tablet Take 1 tablet (112 mcg total) by mouth daily.  90 tablet  3  . lidocaine-prilocaine (EMLA) cream Apply topically as needed. Apply to port 1 hour before chemotherapy  30 g  0  . lidocaine-prilocaine (EMLA) cream Apply topically as needed.  30 g  0  . lipase/protease/amylase (CREON-10/PANCREASE) 12000 UNITS CPEP Take 1 capsule by mouth 3 (three) times daily before meals.      . prochlorperazine (COMPAZINE) 10 MG tablet Take 1 tablet (10 mg total) by mouth every 6 (six) hours as needed.  60 tablet  0  . oxyCODONE-acetaminophen (PERCOCET/ROXICET) 5-325 MG per tablet Take 1 tablet by mouth every 6 (six) hours as needed for pain.  100 tablet  0  . traMADol (ULTRAM) 50 MG tablet Take 1 tablet (50 mg total)  by mouth every 8 (eight) hours as needed for pain.  30 tablet  0  . triamcinolone cream (KENALOG) 0.1 % Apply 1 application topically 2 (two) times daily.  454 g  4   No current facility-administered medications for this visit.    SURGICAL HISTORY:  Past Surgical History  Procedure Laterality Date  . Abdominal hysterectomy    . Cholecystectomy    . Appendectomy    . Video bronchoscopy Bilateral 05/10/2012    Procedure: VIDEO BRONCHOSCOPY WITHOUT FLUORO;  Surgeon: Nyoka Cowden, MD;  Location: [facility];  Service: Endoscopy;   Laterality: Bilateral;    REVIEW OF SYSTEMS:  A comprehensive review of systems was negative except for: Constitutional: positive for anorexia and fatigue   PHYSICAL EXAMINATION: General appearance: alert, cooperative, fatigued and no distress Head: Normocephalic, without obvious abnormality, atraumatic Neck: no adenopathy Lymph nodes: Cervical, supraclavicular, and axillary nodes normal. Resp: clear to auscultation bilaterally Back: negative, symmetric, no curvature. ROM normal. No CVA tenderness. Cardio: regular rate and rhythm, S1, S2 normal, no murmur, click, rub or gallop GI: soft, non-tender; bowel sounds normal; no masses,  no organomegaly Extremities: extremities normal, atraumatic, no cyanosis or edema Neurologic: Alert and oriented X 3, normal strength and tone. Normal symmetric reflexes. Normal coordination and gait  ECOG PERFORMANCE STATUS: 1 - Symptomatic but completely ambulatory  Blood pressure 124/47, pulse 88, temperature 98.2 F (36.8 C), temperature source Oral, resp. rate 18, height 5\' 3"  (1.6 m), weight 146 lb (66.225 kg), SpO2 100.00%.  LABORATORY DATA: Lab Results  Component Value Date   WBC 4.1 10/11/2012   HGB 9.2* 10/11/2012   HCT 27.6* 10/11/2012   MCV 95.5 10/11/2012   PLT 140* 10/11/2012      Chemistry      Component Value Date/Time   NA 139 10/04/2012 1116   NA 135 06/01/2012 1116   K 4.1 10/04/2012 1116   K 4.3 06/01/2012 1116   CL 101 08/02/2012 0800   CL 99 06/01/2012 1116   CO2 23 10/04/2012 1116   CO2 29 06/01/2012 1116   BUN 11.7 10/04/2012 1116   BUN 10 06/01/2012 1116   CREATININE 0.7 10/04/2012 1116   CREATININE 0.66 06/01/2012 1116   CREATININE 0.73 03/28/2012 0850      Component Value Date/Time   CALCIUM 9.0 10/04/2012 1116   CALCIUM 9.1 06/01/2012 1116   ALKPHOS 105 10/04/2012 1116   ALKPHOS 70 06/01/2012 1116   AST 18 10/04/2012 1116   AST 15 06/01/2012 1116   ALT 15 10/04/2012 1116   ALT 9 06/01/2012 1116   BILITOT 0.34 10/04/2012 1116   BILITOT  0.5 06/01/2012 1116       RADIOGRAPHIC STUDIES: Ct Chest W Contrast  09/27/2012   *RADIOLOGY REPORT*  Clinical Data:  A small cell lung cancer.  CT CHEST, ABDOMEN AND PELVIS WITH CONTRAST  Technique:  Multidetector CT imaging of the chest, abdomen and pelvis was performed following the standard protocol during bolus administration of intravenous contrast.  Contrast: OMNIPAQUE IOHEXOL 300 MG/ML  SOLN  Comparison:  08/23/2012    CT CHEST  Findings:  The tip of the right-sided Port-A-Cath is positioned in the upper right atrium.  There is no axillary lymphadenopathy.  The small right supraclavicular lymph nodes seen previously has resolved in the interval.  As before, there is amorphous soft tissue attenuation in the AP window and subcarinal region consistent with treated disease.  This is stable in appearance. The 9 mm short-axis high right paratracheal  lymph node seen on the previous study measures 7 mm today.  The heart is enlarged. Coronary artery calcification is noted.  No pericardial or pleural effusion.  The volume loss and scarring seen in the central left upper lobe is stable.  There is no new or progressive disease identified in either lung.  Bone windows shows stable appearance of the sclerotic T6 lesion.  IMPRESSION: Stable appearance of the central left upper lobe soft tissue density, compatible with treated disease.  No new or progressive findings in the chest.  Resolution of the right supraclavicular lymphadenopathy with stable appearance of the amorphous soft tissue attenuation seen in the central mediastinum.    CT ABDOMEN AND PELVIS  Findings:  The liver and spleen are unremarkable.  The stomach and pancreas are unremarkable.  Large duodenal diverticulum noted. Gallbladder is surgically absent.  The right adrenal nodule is stable, measuring 14 x 11 mm today compared 215 x 11 mm previously. Left adrenal nodule is 16 x 10 mm today compared 18 x 11 mm previously.  Atherosclerotic  calcification noted in the abdominal aorta without aneurysm.  There is no free fluid or lymphadenopathy in the abdomen.  There is an area of small bowel wall thickening in the right abdomen, similar to before.  This is nonobstructive and no substantial perienteric edema or inflammation is evident.  There are tiny cortical hypo attenuating lesions in each kidney, too small to characterize, but likely representing tiny renal cyst.  Imaging through the pelvis shows no free intraperitoneal fluid. There is no pelvic sidewall lymphadenopathy.  Uterus is surgically absent.  There is no adnexal mass.  No substantial diverticular change in the colon.  There is no colonic diverticulitis.  The terminal ileum is unremarkable. The appendix is not visualized, but there is no edema or inflammation in the region of the cecum.  The sclerotic lesions seen posteriorly and the iliac bones bilaterally are stable.  IMPRESSION: Stable exam.  No new or progressive disease in the abdomen or pelvis.  Bilateral adrenal nodules are concerning for metastatic involvement, but are stable in size.  Bilateral sclerotic lesions in the iliac bones, near the SI joints. Stable lytic lesion in the anterior right iliac crest.  Imaging features are suspicious for metastatic disease but there has been no substantial interval progression.  Stable appearance of mild small bowel wall thickening in the right mid abdomen.  Etiology is indeterminate, but there is no evidence for associated obstruction or perienteric edema/inflammation.   Original Report Authenticated By: Kennith Center, M.D.   ASSESSMENT AND PLAN: This is a very pleasant 79 years old white female with extensive stage small cell lung cancer status post 5 cycles of systemic chemotherapy with carboplatin and etoposide in addition to palliative radiotherapy to the left lung field and the patient has been doing fine and tolerating her treatment fairly well except for increasing fatigue and weakness  special after the last cycle of her treatment few days ago. She continues to have significant improvement in her disease after cycle #4 based on the recent CT scan of the chest, abdomen and pelvis. I discussed the scan results and showed the images to the patient and her daughter. I recommended for the patient to discontinue her systemic chemotherapy after cycle #5. I would see her back for followup visit in 2 months with repeat CT scan of the chest, abdomen and pelvis for restaging of her disease. I briefly discussed with the patient consideration of prophylactic cranial irradiation I will discuss it  in more details in her upcoming visit in 2 months. The patient was advised to call immediately if she has any concerning symptoms in the interval.  The patient voices understanding of current disease status and treatment options and is in agreement with the current care plan.  All questions were answered. The patient knows to call the clinic with any problems, questions or concerns. We can certainly see the patient much sooner if necessary.  I spent 15 minutes counseling the patient face to face. The total time spent in the appointment was 25 minutes.

## 2012-10-11 NOTE — Telephone Encounter (Signed)
gv and printed appt sched and avs for pt....gv pt barium   °

## 2012-10-11 NOTE — Patient Instructions (Addendum)
CURRENT THERAPY:  1) Systemic chemotherapy with carboplatin for AUC of 5 on day 1 and etoposide at 120 mg/M2 on days 1, 2 and 3 with Neulasta support on day 4, first dose started on 07/12/2012. Status post 5 cycles, last cycle was given on 10/04/2012.  2) palliative radiotherapy to the left lung field under the care of Dr. Mitzi Hansen completed on 08/14/2012.  CHEMOTHERAPY INTENT: Palliative  CURRENT # OF CHEMOTHERAPY CYCLES: 5  CURRENT ANTIEMETICS: Zofran, dexamethasone,Compazine  CURRENT SMOKING STATUS: Former smoker, quit 5/228/2014  ORAL CHEMOTHERAPY AND CONSENT: n/a  CURRENT BISPHOSPHONATES USE: none  PAIN MANAGEMENT: Percocet  NARCOTICS INDUCED CONSTIPATION: occasional  LIVING WILL AND CODE STATUS: has a living will, DNR

## 2012-10-12 ENCOUNTER — Ambulatory Visit: Payer: Medicare Other

## 2012-10-13 ENCOUNTER — Ambulatory Visit: Payer: Medicare Other

## 2012-10-16 ENCOUNTER — Telehealth: Payer: Self-pay | Admitting: Dietician

## 2012-10-18 ENCOUNTER — Other Ambulatory Visit (HOSPITAL_BASED_OUTPATIENT_CLINIC_OR_DEPARTMENT_OTHER): Payer: Medicare Other

## 2012-10-18 DIAGNOSIS — C3410 Malignant neoplasm of upper lobe, unspecified bronchus or lung: Secondary | ICD-10-CM

## 2012-10-18 DIAGNOSIS — C3492 Malignant neoplasm of unspecified part of left bronchus or lung: Secondary | ICD-10-CM

## 2012-10-18 LAB — CBC WITH DIFFERENTIAL/PLATELET
Basophils Absolute: 0 10*3/uL (ref 0.0–0.1)
Eosinophils Absolute: 0.1 10*3/uL (ref 0.0–0.5)
HCT: 26.9 % — ABNORMAL LOW (ref 34.8–46.6)
HGB: 8.9 g/dL — ABNORMAL LOW (ref 11.6–15.9)
MCH: 31.8 pg (ref 25.1–34.0)
MCV: 96.5 fL (ref 79.5–101.0)
MONO%: 5.9 % (ref 0.0–14.0)
NEUT#: 11.9 10*3/uL — ABNORMAL HIGH (ref 1.5–6.5)
NEUT%: 84.8 % — ABNORMAL HIGH (ref 38.4–76.8)
RDW: 21.2 % — ABNORMAL HIGH (ref 11.2–14.5)
lymph#: 1.2 10*3/uL (ref 0.9–3.3)

## 2012-10-18 LAB — COMPREHENSIVE METABOLIC PANEL (CC13)
Albumin: 3 g/dL — ABNORMAL LOW (ref 3.5–5.0)
BUN: 8.3 mg/dL (ref 7.0–26.0)
Calcium: 8.7 mg/dL (ref 8.4–10.4)
Chloride: 102 mEq/L (ref 98–109)
Creatinine: 0.8 mg/dL (ref 0.6–1.1)
Glucose: 252 mg/dl — ABNORMAL HIGH (ref 70–140)
Potassium: 3.7 mEq/L (ref 3.5–5.1)

## 2012-10-25 ENCOUNTER — Ambulatory Visit: Payer: Medicare Other

## 2012-10-25 ENCOUNTER — Other Ambulatory Visit (HOSPITAL_BASED_OUTPATIENT_CLINIC_OR_DEPARTMENT_OTHER): Payer: Medicare Other | Admitting: Lab

## 2012-10-25 DIAGNOSIS — C3490 Malignant neoplasm of unspecified part of unspecified bronchus or lung: Secondary | ICD-10-CM

## 2012-10-25 LAB — COMPREHENSIVE METABOLIC PANEL (CC13)
AST: 15 U/L (ref 5–34)
Albumin: 2.9 g/dL — ABNORMAL LOW (ref 3.5–5.0)
BUN: 10.2 mg/dL (ref 7.0–26.0)
CO2: 27 mEq/L (ref 22–29)
Calcium: 8.9 mg/dL (ref 8.4–10.4)
Chloride: 103 mEq/L (ref 98–109)
Glucose: 249 mg/dl — ABNORMAL HIGH (ref 70–140)
Potassium: 4.1 mEq/L (ref 3.5–5.1)

## 2012-10-25 LAB — CBC WITH DIFFERENTIAL/PLATELET
Basophils Absolute: 0.1 10*3/uL (ref 0.0–0.1)
Eosinophils Absolute: 0 10*3/uL (ref 0.0–0.5)
HCT: 28.5 % — ABNORMAL LOW (ref 34.8–46.6)
HGB: 9.6 g/dL — ABNORMAL LOW (ref 11.6–15.9)
MONO#: 0.5 10*3/uL (ref 0.1–0.9)
NEUT#: 7.2 10*3/uL — ABNORMAL HIGH (ref 1.5–6.5)
RDW: 21.1 % — ABNORMAL HIGH (ref 11.2–14.5)
lymph#: 0.8 10*3/uL — ABNORMAL LOW (ref 0.9–3.3)

## 2012-10-26 ENCOUNTER — Encounter: Payer: Medicare Other | Admitting: Nutrition

## 2012-10-26 ENCOUNTER — Ambulatory Visit: Payer: Medicare Other

## 2012-10-27 ENCOUNTER — Ambulatory Visit: Payer: Medicare Other

## 2012-10-28 ENCOUNTER — Ambulatory Visit: Payer: Medicare Other

## 2012-11-13 ENCOUNTER — Telehealth: Payer: Self-pay | Admitting: *Deleted

## 2012-11-13 NOTE — Telephone Encounter (Signed)
Pt's daughter called stating that pt's port is hurting and itching.  She wants to have it evaluated.  Per Dr Donnald Garre, okay to schedule port flush appt for RN to assess, pt can go to IR to have port evaluated if needed.  Pt's daughter aware of appt. SLJ

## 2012-11-15 ENCOUNTER — Encounter (INDEPENDENT_AMBULATORY_CARE_PROVIDER_SITE_OTHER): Payer: Self-pay

## 2012-11-15 ENCOUNTER — Ambulatory Visit (HOSPITAL_BASED_OUTPATIENT_CLINIC_OR_DEPARTMENT_OTHER): Payer: Medicare Other

## 2012-11-15 VITALS — BP 145/71 | HR 81 | Temp 98.2°F

## 2012-11-15 DIAGNOSIS — C3410 Malignant neoplasm of upper lobe, unspecified bronchus or lung: Secondary | ICD-10-CM

## 2012-11-15 DIAGNOSIS — C3490 Malignant neoplasm of unspecified part of unspecified bronchus or lung: Secondary | ICD-10-CM

## 2012-11-15 DIAGNOSIS — Z452 Encounter for adjustment and management of vascular access device: Secondary | ICD-10-CM

## 2012-11-15 MED ORDER — SODIUM CHLORIDE 0.9 % IJ SOLN
10.0000 mL | INTRAMUSCULAR | Status: DC | PRN
  Administered 2012-11-15: 10 mL via INTRAVENOUS
  Filled 2012-11-15: qty 10

## 2012-11-15 MED ORDER — HEPARIN SOD (PORK) LOCK FLUSH 100 UNIT/ML IV SOLN
500.0000 [IU] | Freq: Once | INTRAVENOUS | Status: AC
  Administered 2012-11-15: 500 [IU] via INTRAVENOUS
  Filled 2012-11-15: qty 5

## 2012-11-15 NOTE — Progress Notes (Signed)
Patient states her port is itching. No redness or swelling noted at port site. Port flushes easily, blood return noted. Patient does not complain of pain when flushing. No swelling noted while flushing either. Dr. Asa Lente desk nurse made aware.

## 2012-11-15 NOTE — Patient Instructions (Signed)
Implanted Port Instructions  An implanted port is a central line that has a round shape and is placed under the skin. It is used for long-term IV (intravenous) access for:  · Medicine.  · Fluids.  · Liquid nutrition, such as TPN (total parenteral nutrition).  · Blood samples.  Ports can be placed:  · In the chest area just below the collarbone (this is the most common place.)  · In the arms.  · In the belly (abdomen) area.  · In the legs.  PARTS OF THE PORT  A port has 2 main parts:  · The reservoir. The reservoir is round, disc-shaped, and will be a small, raised area under your skin.  · The reservoir is the part where a needle is inserted (accessed) to either give medicines or to draw blood.  · The catheter. The catheter is a long, slender tube that extends from the reservoir. The catheter is placed into a large vein.  · Medicine that is inserted into the reservoir goes into the catheter and then into the vein.  INSERTION OF THE PORT  · The port is surgically placed in either an operating room or in a procedural area (interventional radiology).  · Medicine may be given to help you relax during the procedure.  · The skin where the port will be inserted is numbed (local anesthetic).  · 1 or 2 small cuts (incisions) will be made in the skin to insert the port.  · The port can be used after it has been inserted.  INCISION SITE CARE  · The incision site may have small adhesive strips on it. This helps keep the incision site closed. Sometimes, no adhesive strips are placed. Instead of adhesive strips, a special kind of surgical glue is used to keep the incision closed.  · If adhesive strips were placed on the incision sites, do not take them off. They will fall off on their own.  · The incision site may be sore for 1 to 2 days. Pain medicine can help.  · Do not get the incision site wet. Bathe or shower as directed by your caregiver.  · The incision site should heal in 5 to 7 days. A small scar may form after the  incision has healed.  ACCESSING THE PORT  Special steps must be taken to access the port:  · Before the port is accessed, a numbing cream can be placed on the skin. This helps numb the skin over the port site.  · A sterile technique is used to access the port.  · The port is accessed with a needle. Only "non-coring" port needles should be used to access the port. Once the port is accessed, a blood return should be checked. This helps ensure the port is in the vein and is not clogged (clotted).  · If your caregiver believes your port should remain accessed, a clear (transparent) bandage will be placed over the needle site. The bandage and needle will need to be changed every week or as directed by your caregiver.  · Keep the bandage covering the needle clean and dry. Do not get it wet. Follow your caregiver's instructions on how to take a shower or bath when the port is accessed.  · If your port does not need to stay accessed, no bandage is needed over the port.  FLUSHING THE PORT  Flushing the port keeps it from getting clogged. How often the port is flushed depends on:  · If a   constant infusion is running. If a constant infusion is running, the port may not need to be flushed.  · If intermittent medicines are given.  · If the port is not being used.  For intermittent medicines:  · The port will need to be flushed:  · After medicines have been given.  · After blood has been drawn.  · As part of routine maintenance.  · A port is normally flushed with:  · Normal saline.  · Heparin.  · Follow your caregiver's advice on how often, how much, and the type of flush to use on your port.  IMPORTANT PORT INFORMATION  · Tell your caregiver if you are allergic to heparin.  · After your port is placed, you will get a manufacturer's information card. The card has information about your port. Keep this card with you at all times.  · There are many types of ports available. Know what kind of port you have.  · In case of an  emergency, it may be helpful to wear a medical alert bracelet. This can help alert health care workers that you have a port.  · The port can stay in for as long as your caregiver believes it is necessary.  · When it is time for the port to come out, surgery will be done to remove it. The surgery will be similar to how the port was put in.  · If you are in the hospital or clinic:  · Your port will be taken care of and flushed by a nurse.  · If you are at home:  · A home health care nurse may give medicines and take care of the port.  · You or a family member can get special training and directions for giving medicine and taking care of the port at home.  SEEK IMMEDIATE MEDICAL CARE IF:   · Your port does not flush or you are unable to get a blood return.  · New drainage or pus is coming from the incision.  · A bad smell is coming from the incision site.  · You develop swelling or increased redness at the incision site.  · You develop increased swelling or pain at the port site.  · You develop swelling or pain in the surrounding skin near the port.  · You have an oral temperature above 102° F (38.9° C), not controlled by medicine.  MAKE SURE YOU:   · Understand these instructions.  · Will watch your condition.  · Will get help right away if you are not doing well or get worse.  Document Released: 01/25/2005 Document Revised: 04/19/2011 Document Reviewed: 04/18/2008  [employer]® Patient Information ©2014 [employer].

## 2012-11-29 ENCOUNTER — Ambulatory Visit (INDEPENDENT_AMBULATORY_CARE_PROVIDER_SITE_OTHER): Payer: Medicare Other | Admitting: Family Medicine

## 2012-11-29 VITALS — BP 138/72 | HR 85 | Temp 98.5°F | Resp 17 | Ht 63.0 in | Wt 151.0 lb

## 2012-11-29 DIAGNOSIS — K859 Acute pancreatitis without necrosis or infection, unspecified: Secondary | ICD-10-CM

## 2012-11-29 DIAGNOSIS — E039 Hypothyroidism, unspecified: Secondary | ICD-10-CM

## 2012-11-29 DIAGNOSIS — L853 Xerosis cutis: Secondary | ICD-10-CM

## 2012-11-29 DIAGNOSIS — Z23 Encounter for immunization: Secondary | ICD-10-CM

## 2012-11-29 DIAGNOSIS — L589 Radiodermatitis, unspecified: Secondary | ICD-10-CM

## 2012-11-29 DIAGNOSIS — F411 Generalized anxiety disorder: Secondary | ICD-10-CM

## 2012-11-29 DIAGNOSIS — E119 Type 2 diabetes mellitus without complications: Secondary | ICD-10-CM

## 2012-11-29 DIAGNOSIS — L259 Unspecified contact dermatitis, unspecified cause: Secondary | ICD-10-CM

## 2012-11-29 DIAGNOSIS — L309 Dermatitis, unspecified: Secondary | ICD-10-CM

## 2012-11-29 DIAGNOSIS — L738 Other specified follicular disorders: Secondary | ICD-10-CM

## 2012-11-29 DIAGNOSIS — F419 Anxiety disorder, unspecified: Secondary | ICD-10-CM

## 2012-11-29 LAB — POCT GLYCOSYLATED HEMOGLOBIN (HGB A1C): Hemoglobin A1C: 6.9

## 2012-11-29 LAB — GLUCOSE, POCT (MANUAL RESULT ENTRY): POC Glucose: 215 mg/dl — AB (ref 70–99)

## 2012-11-29 MED ORDER — CLONAZEPAM 0.5 MG PO TABS: 1.0000 mg | ORAL_TABLET | Freq: Every day | ORAL | Status: AC

## 2012-11-29 MED ORDER — TRIAMCINOLONE ACETONIDE 0.1 % EX CREA: 1.0000 "application " | TOPICAL_CREAM | Freq: Two times a day (BID) | CUTANEOUS | Status: DC

## 2012-11-29 MED ORDER — LEVOTHYROXINE SODIUM 112 MCG PO TABS: 112.0000 ug | ORAL_TABLET | Freq: Every day | ORAL | Status: AC

## 2012-11-29 MED ORDER — PANCRELIPASE (LIP-PROT-AMYL) 12000-38000 UNITS PO CPEP: 1.0000 | ORAL_CAPSULE | Freq: Three times a day (TID) | ORAL | Status: AC

## 2012-11-29 MED ORDER — DOXEPIN HCL 25 MG PO CAPS: 25.0000 mg | ORAL_CAPSULE | Freq: Every day | ORAL | Status: AC

## 2012-11-29 NOTE — Progress Notes (Signed)
Patient ID: Misty Ball, female   DOB: 1931-07-27, 79 y.o.   MRN: [id number]  @[email or web address] @  Patient ID: Misty Ball MRN: [id number], DOB: 19-Sep-1931, 79 y.o. Date of Encounter: 11/29/2012, 11:03 AM This chart was scribed for [name], MD by Valera Castle, ED Scribe. This patient was seen in room 10 and the patient's care was started at 11:03 AM.   Primary Physician: [name], MD  Chief Complaint: Rx refills, Flu Vaccine  HPI: 79 y.o. year old female with a h/o lung cancer and history below presents for Rx refills and a flu vaccine. She reports that at the end of 08/2012 Dr. Shirline Frees told her her cancer was gone. She reports that he has since then found 3 spots on her brain and states that he is keeping a watch on them and reports that they have not been moving or growing. She state he wants her to get a mammogram. She reports that she currently weighs about 155 lbs, and that they want her to watch her weight change. She has an area on her upper, left chest that she reports is getting removed soon. She states that her sugar levels are okay. She also reports associated abrasions on her right arm and left leg.   She reports her family visits her once or twice a month.    Past Medical History  Diagnosis Date  . Hypertension   . Pancreatitis   . Diabetes mellitus   . Thyroid disease   . lung ca dx'd 06/2012  . History of radiation therapy 07/20/2012-08/14/2012    77 Gy to left lung  . History of chemotherapy 07/12/2012    carboplatin and etoposide     Home Meds: Prior to Admission medications   Medication Sig Start Date End Date Taking? Authorizing Provider  aspirin EC 81 MG tablet Take 81 mg by mouth daily.   Yes Historical Provider, MD  clonazePAM (KLONOPIN) 0.5 MG tablet Take 2 tablets (1 mg total) by mouth at bedtime. 08/16/12  Yes [name], MD  doxepin (SINEQUAN) 25 MG capsule Take 1 capsule (25 mg total) by mouth at bedtime. 08/16/12  Yes [name], MD   Insulin Glargine (LANTUS SOLOSTAR) 100 UNIT/ML SOPN Inject 0.42 mLs into the skin daily. 07/07/12  Yes [name], MD  levothyroxine (SYNTHROID, LEVOTHROID) 112 MCG tablet Take 1 tablet (112 mcg total) by mouth daily. 08/16/12 08/16/13 Yes [name], MD  lipase/protease/amylase (CREON-10/PANCREASE) 12000 UNITS CPEP Take 1 capsule by mouth 3 (three) times daily before meals. 03/02/12  Yes [name], MD  hyaluronate sodium (RADIAPLEXRX) GEL Apply 1 application topically 2 (two) times daily.    Historical Provider, MD  lidocaine-prilocaine (EMLA) cream Apply topically as needed. Apply to port 1 hour before chemotherapy 07/12/12   Si Gaul, MD  lidocaine-prilocaine (EMLA) cream Apply topically as needed. 09/15/12   Conni Slipper, PA-C  oxyCODONE-acetaminophen (PERCOCET/ROXICET) 5-325 MG per tablet Take 1 tablet by mouth every 6 (six) hours as needed for pain. 07/21/12   Oneita Hurt, MD  prochlorperazine (COMPAZINE) 10 MG tablet Take 1 tablet (10 mg total) by mouth every 6 (six) hours as needed. 09/15/12   Conni Slipper, PA-C  traMADol (ULTRAM) 50 MG tablet Take 1 tablet (50 mg total) by mouth every 8 (eight) hours as needed for pain. 07/28/12   [name], MD  triamcinolone cream (KENALOG) 0.1 % Apply 1 application topically 2 (two) times daily. 08/16/12   [name], MD    Allergies:  Allergies  Allergen Reactions  . Sulfa Antibiotics Rash    History   Social History  . Marital Status: Widowed    Spouse Name: N/A    Number of Children: N/A  . Years of Education: N/A   Occupational History  . Not on file.   Social History Main Topics  . Smoking status: Former Smoker -- 0.50 packs/day for 52 years    Types: Cigarettes    Quit date: 07/05/2012  . Smokeless tobacco: Never Used  . Alcohol Use: No  . Drug Use: No  . Sexual Activity: Not on file   Other Topics Concern  . Not on file   Social History Narrative   Widow. Education: Grade.       Review  of Systems: Constitutional: negative for chills, fever, night sweats, weight changes, or fatigue  HEENT: negative for vision changes, hearing loss, congestion, rhinorrhea, ST, epistaxis, or sinus pressure Cardiovascular: negative for chest pain or palpitations Respiratory: negative for hemoptysis, wheezing, shortness of breath, or cough Abdominal: negative for abdominal pain, nausea, vomiting, diarrhea, or constipation Dermatological: negative for rash Positive for abrasion to her right arm and left, lower calf Neurologic: negative for headache, dizziness, or syncope All other systems reviewed and are otherwise negative with the exception to those above and in the HPI.   Physical Exam: Blood pressure 138/72, pulse 85, temperature 98.5 F (36.9 C), temperature source Oral, resp. rate 17, height 5\' 3"  (1.6 m), weight 151 lb (68.493 kg), SpO2 95.00%., Body mass index is 26.76 kg/(m^2). General: Well developed, well nourished, in no acute distress. Head: Normocephalic, atraumatic, eyes without discharge, sclera non-icteric, nares are without discharge. Bilateral auditory canals clear, TM's are without perforation, pearly grey and translucent with reflective cone of light bilaterally. Oral cavity moist, posterior pharynx without exudate, erythema, peritonsillar abscess, or post nasal drip.  Neck: Supple. No thyromegaly. Full ROM. No lymphadenopathy. Lungs: Clear bilaterally to auscultation without wheezes, rales, or rhonchi. Breathing is unlabored. Heart: RRR with S1 S2. No murmurs, rubs, or gallops appreciated. Abdomen: Soft, non-tender, non-distended with normoactive bowel sounds. No hepatomegaly. No rebound/guarding. No obvious abdominal masses. Msk:  Strength and tone normal for age. Extremities/Skin: Warm and dry. No clubbing or cyanosis. No edema. No rashes or suspicious lesions. Neuro: Alert and oriented X 3. Moves all extremities spontaneously. Gait is normal. CNII-XII grossly in  tact. Psych:  Responds to questions appropriately with a normal affect.     ASSESSMENT AND PLAN:  79 y.o. year old female with lung cancer and type 2 diabetes.  The COPD seems to be well-controlled Eczema - Plan: triamcinolone cream (KENALOG) 0.1 %, POCT glucose (manual entry)  Xerosis cutis - Plan: triamcinolone cream (KENALOG) 0.1 %, POCT glucose (manual entry)  Radiation dermatitis - Plan: triamcinolone cream (KENALOG) 0.1 %, POCT glucose (manual entry)  Hypothyroid - Plan: levothyroxine (SYNTHROID, LEVOTHROID) 112 MCG tablet, TSH, POCT glucose (manual entry)  Anxiety - Plan: clonazePAM (KLONOPIN) 0.5 MG tablet, POCT glucose (manual entry)  Anxiety state, unspecified - Plan: doxepin (SINEQUAN) 25 MG capsule, POCT glucose (manual entry)  Pancreatitis - Plan: lipase/protease/amylase (CREON-12/PANCREASE) 12000 UNITS CPEP capsule, POCT glucose (manual entry)  Type 2 diabetes mellitus - Plan: POCT glycosylated hemoglobin (Hb A1C), POCT glucose (manual entry), CANCELED: POCT urinalysis dipstick  Flu vaccine need - Plan: Flu Vaccine QUAD 36+ mos IM    Signed, [name], MD 11/29/2012 11:03 AM

## 2012-11-30 LAB — TSH: TSH: 3.468 u[IU]/mL (ref 0.350–4.500)

## 2012-12-09 ENCOUNTER — Other Ambulatory Visit: Payer: Self-pay | Admitting: Family Medicine

## 2012-12-11 ENCOUNTER — Encounter (HOSPITAL_COMMUNITY): Payer: Self-pay

## 2012-12-11 ENCOUNTER — Ambulatory Visit (HOSPITAL_COMMUNITY)
Admission: RE | Admit: 2012-12-11 | Discharge: 2012-12-11 | Disposition: A | Discharge status: Home / Self Care | Payer: Medicare Other | Source: Ambulatory Visit | Attending: Internal Medicine | Admitting: Internal Medicine

## 2012-12-11 ENCOUNTER — Other Ambulatory Visit (HOSPITAL_BASED_OUTPATIENT_CLINIC_OR_DEPARTMENT_OTHER): Payer: Medicare Other | Admitting: Lab

## 2012-12-11 DIAGNOSIS — Z794 Long term (current) use of insulin: Secondary | ICD-10-CM

## 2012-12-11 DIAGNOSIS — N170 Acute kidney failure with tubular necrosis: Secondary | ICD-10-CM | POA: Diagnosis present

## 2012-12-11 DIAGNOSIS — Z79899 Other long term (current) drug therapy: Secondary | ICD-10-CM

## 2012-12-11 DIAGNOSIS — Z9221 Personal history of antineoplastic chemotherapy: Secondary | ICD-10-CM | POA: Insufficient documentation

## 2012-12-11 DIAGNOSIS — J449 Chronic obstructive pulmonary disease, unspecified: Secondary | ICD-10-CM | POA: Diagnosis present

## 2012-12-11 DIAGNOSIS — Z923 Personal history of irradiation: Secondary | ICD-10-CM | POA: Insufficient documentation

## 2012-12-11 DIAGNOSIS — N289 Disorder of kidney and ureter, unspecified: Secondary | ICD-10-CM | POA: Insufficient documentation

## 2012-12-11 DIAGNOSIS — Z833 Family history of diabetes mellitus: Secondary | ICD-10-CM

## 2012-12-11 DIAGNOSIS — E039 Hypothyroidism, unspecified: Secondary | ICD-10-CM | POA: Diagnosis present

## 2012-12-11 DIAGNOSIS — J90 Pleural effusion, not elsewhere classified: Secondary | ICD-10-CM | POA: Insufficient documentation

## 2012-12-11 DIAGNOSIS — R109 Unspecified abdominal pain: Secondary | ICD-10-CM | POA: Diagnosis not present

## 2012-12-11 DIAGNOSIS — J4489 Other specified chronic obstructive pulmonary disease: Secondary | ICD-10-CM | POA: Diagnosis present

## 2012-12-11 DIAGNOSIS — C7970 Secondary malignant neoplasm of unspecified adrenal gland: Secondary | ICD-10-CM | POA: Diagnosis present

## 2012-12-11 DIAGNOSIS — C3492 Malignant neoplasm of unspecified part of left bronchus or lung: Secondary | ICD-10-CM

## 2012-12-11 DIAGNOSIS — N179 Acute kidney failure, unspecified: Secondary | ICD-10-CM | POA: Diagnosis present

## 2012-12-11 DIAGNOSIS — Z66 Do not resuscitate: Secondary | ICD-10-CM | POA: Diagnosis present

## 2012-12-11 DIAGNOSIS — M899 Disorder of bone, unspecified: Secondary | ICD-10-CM | POA: Insufficient documentation

## 2012-12-11 DIAGNOSIS — E1169 Type 2 diabetes mellitus with other specified complication: Secondary | ICD-10-CM | POA: Diagnosis present

## 2012-12-11 DIAGNOSIS — G9589 Other specified diseases of spinal cord: Secondary | ICD-10-CM | POA: Insufficient documentation

## 2012-12-11 DIAGNOSIS — C3410 Malignant neoplasm of upper lobe, unspecified bronchus or lung: Secondary | ICD-10-CM

## 2012-12-11 DIAGNOSIS — E871 Hypo-osmolality and hyponatremia: Secondary | ICD-10-CM | POA: Diagnosis present

## 2012-12-11 DIAGNOSIS — Z7982 Long term (current) use of aspirin: Secondary | ICD-10-CM

## 2012-12-11 DIAGNOSIS — R059 Cough, unspecified: Secondary | ICD-10-CM | POA: Insufficient documentation

## 2012-12-11 DIAGNOSIS — I5033 Acute on chronic diastolic (congestive) heart failure: Principal | ICD-10-CM | POA: Diagnosis present

## 2012-12-11 DIAGNOSIS — J189 Pneumonia, unspecified organism: Secondary | ICD-10-CM | POA: Diagnosis present

## 2012-12-11 DIAGNOSIS — R05 Cough: Secondary | ICD-10-CM | POA: Insufficient documentation

## 2012-12-11 DIAGNOSIS — R112 Nausea with vomiting, unspecified: Secondary | ICD-10-CM | POA: Diagnosis not present

## 2012-12-11 DIAGNOSIS — E876 Hypokalemia: Secondary | ICD-10-CM | POA: Diagnosis present

## 2012-12-11 DIAGNOSIS — E278 Other specified disorders of adrenal gland: Secondary | ICD-10-CM | POA: Insufficient documentation

## 2012-12-11 DIAGNOSIS — I10 Essential (primary) hypertension: Secondary | ICD-10-CM | POA: Diagnosis present

## 2012-12-11 DIAGNOSIS — K5710 Diverticulosis of small intestine without perforation or abscess without bleeding: Secondary | ICD-10-CM | POA: Insufficient documentation

## 2012-12-11 DIAGNOSIS — I9589 Other hypotension: Secondary | ICD-10-CM | POA: Diagnosis present

## 2012-12-11 DIAGNOSIS — E785 Hyperlipidemia, unspecified: Secondary | ICD-10-CM | POA: Diagnosis present

## 2012-12-11 DIAGNOSIS — I959 Hypotension, unspecified: Secondary | ICD-10-CM | POA: Diagnosis present

## 2012-12-11 DIAGNOSIS — C3490 Malignant neoplasm of unspecified part of unspecified bronchus or lung: Secondary | ICD-10-CM | POA: Insufficient documentation

## 2012-12-11 DIAGNOSIS — I319 Disease of pericardium, unspecified: Secondary | ICD-10-CM | POA: Diagnosis present

## 2012-12-11 DIAGNOSIS — G9340 Encephalopathy, unspecified: Secondary | ICD-10-CM | POA: Diagnosis not present

## 2012-12-11 DIAGNOSIS — F17200 Nicotine dependence, unspecified, uncomplicated: Secondary | ICD-10-CM | POA: Diagnosis present

## 2012-12-11 LAB — CBC WITH DIFFERENTIAL/PLATELET
BASO%: 0.4 % (ref 0.0–2.0)
Basophils Absolute: 0 10*3/uL (ref 0.0–0.1)
HCT: 40.6 % (ref 34.8–46.6)
HGB: 13.2 g/dL (ref 11.6–15.9)
MCH: 29.3 pg (ref 25.1–34.0)
MONO#: 0.3 10*3/uL (ref 0.1–0.9)
NEUT#: 4 10*3/uL (ref 1.5–6.5)
NEUT%: 71.7 % (ref 38.4–76.8)
Platelets: 240 10*3/uL (ref 145–400)
RDW: 16.4 % — ABNORMAL HIGH (ref 11.2–14.5)
WBC: 5.6 10*3/uL (ref 3.9–10.3)
lymph#: 1.2 10*3/uL (ref 0.9–3.3)

## 2012-12-11 LAB — COMPREHENSIVE METABOLIC PANEL (CC13)
ALT: 9 U/L (ref 0–55)
Albumin: 3.1 g/dL — ABNORMAL LOW (ref 3.5–5.0)
Anion Gap: 11 mEq/L (ref 3–11)
BUN: 15.9 mg/dL (ref 7.0–26.0)
CO2: 26 mEq/L (ref 22–29)
Calcium: 9.5 mg/dL (ref 8.4–10.4)
Chloride: 100 mEq/L (ref 98–109)
Creatinine: 0.8 mg/dL (ref 0.6–1.1)
Potassium: 3.9 mEq/L (ref 3.5–5.1)
Total Protein: 6.8 g/dL (ref 6.4–8.3)

## 2012-12-11 MED ORDER — IOHEXOL 300 MG/ML  SOLN
100.0000 mL | Freq: Once | INTRAMUSCULAR | Status: AC | PRN
  Administered 2012-12-11: 100 mL via INTRAVENOUS

## 2012-12-13 ENCOUNTER — Encounter (INDEPENDENT_AMBULATORY_CARE_PROVIDER_SITE_OTHER): Payer: Self-pay

## 2012-12-13 ENCOUNTER — Telehealth: Payer: Self-pay | Admitting: Internal Medicine

## 2012-12-13 ENCOUNTER — Encounter: Payer: Self-pay | Admitting: Internal Medicine

## 2012-12-13 ENCOUNTER — Ambulatory Visit (HOSPITAL_BASED_OUTPATIENT_CLINIC_OR_DEPARTMENT_OTHER): Payer: Medicare Other | Admitting: Internal Medicine

## 2012-12-13 ENCOUNTER — Telehealth: Payer: Self-pay | Admitting: *Deleted

## 2012-12-13 VITALS — BP 154/79 | HR 97 | Temp 98.7°F | Resp 20 | Ht 63.0 in | Wt 154.2 lb

## 2012-12-13 DIAGNOSIS — C3492 Malignant neoplasm of unspecified part of left bronchus or lung: Secondary | ICD-10-CM

## 2012-12-13 DIAGNOSIS — C3490 Malignant neoplasm of unspecified part of unspecified bronchus or lung: Secondary | ICD-10-CM

## 2012-12-13 NOTE — Telephone Encounter (Signed)
Per staff message and POF I have scheduled appts.  JMW  

## 2012-12-13 NOTE — Telephone Encounter (Signed)
gv adn printed appt sched and avs forpt for NOV...emailed MW to add tx.

## 2012-12-13 NOTE — Progress Notes (Signed)
[facility] [phone]   [phone]  OFFICE PROGRESS NOTE  [name], MD [address] [place] [address]  DIAGNOSIS: Extensive stage small cell lung cancer diagnosed in May of 2014   PRIOR THERAPY:  1) Systemic chemotherapy with carboplatin for AUC of 5 on day 1 and etoposide at 120 mg/M2 on days 1, 2 and 3 with Neulasta support on day 4, first dose started on 07/12/2012. Status post 5 cycles, last cycle was given on 10/04/2012.  2) palliative radiotherapy to the left lung field under the care of Dr. Mitzi Hansen completed on 08/14/2012.    CURRENT THERAPY: Systemic chemotherapy with cisplatin 30 mg/M2 and irinotecan 65 mg/M2 on days 1 and 8 every 3 weeks. First dose on 12/18/2012.   CHEMOTHERAPY INTENT: Palliative  CURRENT # OF CHEMOTHERAPY CYCLES: 1 CURRENT ANTIEMETICS: Zofran, dexamethasone,Compazine  CURRENT SMOKING STATUS: Former smoker, quit 5/228/2014  ORAL CHEMOTHERAPY AND CONSENT: n/a  CURRENT BISPHOSPHONATES USE: none  PAIN MANAGEMENT: Percocet  NARCOTICS INDUCED CONSTIPATION: occasional  LIVING WILL AND CODE STATUS: has a living will, DNR   INTERVAL HISTORY: Misty Ball 79 y.o. female returns to the clinic today for followup visit accompanied by her son. The patient is feeling fine today except for fatigue and chest congestion. She denied having any significant chest pain but has shortness of breath with exertion with no cough or hemoptysis. The patient denied having any significant weight loss or night sweats. She has no nausea or vomiting. She has no fever or chills. She has been observation for the last 2 months. The patient had repeat CT scan of the chest, abdomen and pelvis performed recently and she is here for evaluation and discussion of her scan results.  MEDICAL HISTORY: Past Medical History  Diagnosis Date  . Hypertension   . Pancreatitis   . Diabetes mellitus   . Thyroid disease   . lung ca dx'd 06/2012   . History of radiation therapy 07/20/2012-08/14/2012    37.5 Gy to left lung  . History of chemotherapy 07/12/2012    carboplatin and etoposide    ALLERGIES:  is allergic to sulfa antibiotics.  MEDICATIONS:  Current Outpatient Prescriptions  Medication Sig Dispense Refill  . aspirin EC 81 MG tablet Take 81 mg by mouth daily.      . B-D ULTRAFINE III SHORT PEN 31G X 8 MM MISC USE AS DIRECTED EVERY MORNING  100 each  1  . clonazePAM (KLONOPIN) 0.5 MG tablet Take 2 tablets (1 mg total) by mouth at bedtime.  90 tablet  1  . doxepin (SINEQUAN) 25 MG capsule Take 1 capsule (25 mg total) by mouth at bedtime.  90 capsule  3  . hyaluronate sodium (RADIAPLEXRX) GEL Apply 1 application topically 2 (two) times daily.      . Insulin Glargine (LANTUS SOLOSTAR) 100 UNIT/ML SOPN Inject 0.42 mLs into the skin daily.  5 pen  11  . levothyroxine (SYNTHROID, LEVOTHROID) 112 MCG tablet Take 1 tablet (112 mcg total) by mouth daily.  90 tablet  3  . lidocaine-prilocaine (EMLA) cream Apply topically as needed. Apply to port 1 hour before chemotherapy  30 g  0  . lipase/protease/amylase (CREON-12/PANCREASE) 12000 UNITS CPEP capsule Take 1 capsule by mouth 3 (three) times daily before meals.  270 capsule  6  . prochlorperazine (COMPAZINE) 10 MG tablet Take 1 tablet (10 mg total) by mouth every 6 (six) hours as needed.  60 tablet  0  .  triamcinolone cream (KENALOG) 0.1 % Apply 1 application topically 2 (two) times daily.  454 g  4   No current facility-administered medications for this visit.    SURGICAL HISTORY:  Past Surgical History  Procedure Laterality Date  . Abdominal hysterectomy    . Cholecystectomy    . Appendectomy    . Video bronchoscopy Bilateral 05/10/2012    Procedure: VIDEO BRONCHOSCOPY WITHOUT FLUORO;  Surgeon: Nyoka Cowden, MD;  Location: [facility];  Service: Endoscopy;  Laterality: Bilateral;    REVIEW OF SYSTEMS:  Constitutional: positive for fatigue Eyes: negative Ears, nose, mouth,  throat, and face: negative Respiratory: positive for dyspnea on exertion Cardiovascular: negative Gastrointestinal: negative Genitourinary:negative Integument/breast: negative Hematologic/lymphatic: negative Musculoskeletal:negative Neurological: negative Behavioral/Psych: negative Endocrine: negative Allergic/Immunologic: negative   PHYSICAL EXAMINATION: General appearance: alert, cooperative, fatigued and no distress Head: Normocephalic, without obvious abnormality, atraumatic Neck: no adenopathy Lymph nodes: Cervical, supraclavicular, and axillary nodes normal. Resp: clear to auscultation bilaterally Back: negative, symmetric, no curvature. ROM normal. No CVA tenderness. Cardio: regular rate and rhythm, S1, S2 normal, no murmur, click, rub or gallop GI: soft, non-tender; bowel sounds normal; no masses,  no organomegaly Extremities: extremities normal, atraumatic, no cyanosis or edema Neurologic: Alert and oriented X 3, normal strength and tone. Normal symmetric reflexes. Normal coordination and gait  ECOG PERFORMANCE STATUS: 1 - Symptomatic but completely ambulatory  Blood pressure 154/79, pulse 97, temperature 98.7 F (37.1 C), temperature source Oral, resp. rate 20, height 5\' 3"  (1.6 m), weight 154 lb 3.2 oz (69.945 kg).  LABORATORY DATA: Lab Results  Component Value Date   WBC 5.6 12/11/2012   HGB 13.2 12/11/2012   HCT 40.6 12/11/2012   MCV 90.3 12/11/2012   PLT 240 12/11/2012      Chemistry      Component Value Date/Time   NA 137 12/11/2012 0918   NA 135 06/01/2012 1116   K 3.9 12/11/2012 0918   K 4.3 06/01/2012 1116   CL 101 08/02/2012 0800   CL 99 06/01/2012 1116   CO2 26 12/11/2012 0918   CO2 29 06/01/2012 1116   BUN 15.9 12/11/2012 0918   BUN 10 06/01/2012 1116   CREATININE 0.8 12/11/2012 0918   CREATININE 0.66 06/01/2012 1116   CREATININE 0.73 03/28/2012 0850      Component Value Date/Time   CALCIUM 9.5 12/11/2012 0918   CALCIUM 9.1 06/01/2012 1116   ALKPHOS 92  12/11/2012 0918   ALKPHOS 70 06/01/2012 1116   AST 15 12/11/2012 0918   AST 15 06/01/2012 1116   ALT 9 12/11/2012 0918   ALT 9 06/01/2012 1116   BILITOT 0.38 12/11/2012 0918   BILITOT 0.5 06/01/2012 1116       RADIOGRAPHIC STUDIES: Ct Chest W Contrast  12/11/2012   CLINICAL DATA:  Small cell lung cancer. Cough. Previous radiation therapy and chemotherapy. Nausea and vomiting.  EXAM: CT CHEST, ABDOMEN, AND PELVIS WITH CONTRAST  TECHNIQUE: Multidetector CT imaging of the chest, abdomen and pelvis was performed following the standard protocol during bolus administration of intravenous contrast.  CONTRAST:  OMNIPAQUE IOHEXOL 300 MG/ML  SOLN  COMPARISON:  09/27/2012.  FINDINGS:   CT CHEST FINDINGS  No pathologically enlarged mediastinal lymph nodes. Bi hilar lymphoid tissue. No axillary adenopathy. Atherosclerotic calcification of the arterial vasculature, including extensive involvement of the coronary arteries. Ascending aorta measures 4 cm. Incidental note is made of left vertebral artery origin from the proximal left subclavian artery or aortic arch. Pulmonary arteries are enlarged. Heart is  enlarged. No pericardial effusion.  Moderate left pleural effusion, new. No definite pleural nodularity. Progressive consolidative change in the left upper lobe, including the lingular portion. Compressive atelectasis in the left lower lobe. Scarring and volume loss in the right hemi thorax. A few scattered tiny nodular densities in the right lung are likely unchanged. Airway is otherwise unremarkable.    CT ABDOMEN AND PELVIS FINDINGS  Minimal intrahepatic and extrahepatic biliary duct dilatation, unchanged. Liver is otherwise unremarkable. Cholecystectomy.  Adrenal nodules have enlarged, measuring 2.2 x 1.9 cm on the right (previously 1.1 x 1.4 cm) and 2.0 x 2.7 cm on the left (previously 1.0 x 1.6 cm).  Renal vascular calcifications bilaterally. Sub cm low-attenuation lesions in the right kidney are too small  to characterize but stable. Possible left renal stones, no obstruction. Spleen, pancreas, stomach and bowel are unremarkable. Incidental note is made of a duodenal diverticulum.  Atherosclerotic calcification of the arterial vasculature without abdominal aortic aneurysm. Hysterectomy. No pathologically enlarged lymph nodes. No free fluid. There is focal dilatation of the left gonadal vein, anterior to the left psoas muscle (series 2, image 88), simulating a lymph node. Finding is unchanged. Small bowel mesenteric lymph nodes measure up to 8 mm in short axis, more prominent on the prior exam but not enlarged by CT size criteria.  A small rounded sclerotic lesion in the right iliac wing (image 90) is unchanged. Areas of sclerosis in the iliac wings, adjacent to the sacroiliac joints, also appear stable. Degenerative changes are seen throughout the spine. Possible old fracture involving the left 5th anterolateral rib. Mild T6 compression fracture is unchanged.    IMPRESSION: 1. Interval progression of small cell lung cancer, as evidenced by increasing consolidation in the left upper lobe, new moderate left pleural effusion and enlarging bilateral adrenal metastases. 2. Areas of sclerosis in the iliac wings appear stable. 3. Borderline ascending aortic aneurysm. Enlarged pulmonary arteries are indicative of pulmonary arterial hypertension. Extensive coronary artery calcifications. 4. Possible left renal stones. 5. Increase in prominence of mesenteric lymph nodes, not pathologically enlarged by CT size criteria, nonspecific. Continued attention on followup exams is warranted.   Electronically Signed   By: Leanna Battles M.D.   On: 12/11/2012 10:49   ASSESSMENT AND PLAN: This is a very pleasant 79 years old white female with extensive stage small cell lung cancer status post 5 cycles of systemic chemotherapy with carboplatin and etoposide in addition to palliative radiotherapy to the left lung field. The patient has  been on observation for the last 2 months. Unfortunately the recent CT scan of the chest, abdomen and pelvis showed evidence for disease progression with increasing consolidation in the left upper lobe as well as new moderate left pleural effusion and enlarging bilateral adrenal metastasis. I discussed the scan results with the patient and her son. I recommended for her to resume systemic chemotherapy. She was also given him the option of palliative care. The patient is interested in proceeding with chemotherapy. I recommended for her regimen consisting of cisplatin 60 mg/M2 and irinotecan 65 mg/M2 on days 1 and 8 every 3 weeks. I discussed with the patient adverse effect of this treatment including but not limited to alopecia, myelosuppression, nausea and vomiting, peripheral neuropathy, liver or renal dysfunction as well as diarrhea and hearing deficit. The patient would like to proceed with treatment as planned. She is expected to start the first cycle of this treatment on 12/18/2012. She would come back for followup visit in 2 weeks for  evaluation and management any adverse effect of her chemotherapy. The patient was advised to call immediately if she has any concerning symptoms in the interval.  The patient voices understanding of current disease status and treatment options and is in agreement with the current care plan.  All questions were answered. The patient knows to call the clinic with any problems, questions or concerns. We can certainly see the patient much sooner if necessary.  I spent 15 minutes counseling the patient face to face. The total time spent in the appointment was 25 minutes.

## 2012-12-13 NOTE — Patient Instructions (Signed)
CURRENT THERAPY: Systemic chemotherapy with cisplatin 30 mg/M2 and irinotecan 65 mg/M2 on days 1 and 8 every 3 weeks. First dose on 12/18/2012.   CHEMOTHERAPY INTENT: Palliative  CURRENT # OF CHEMOTHERAPY CYCLES: 1 CURRENT ANTIEMETICS: Zofran, dexamethasone,Compazine  CURRENT SMOKING STATUS: Former smoker, quit 5/228/2014  ORAL CHEMOTHERAPY AND CONSENT: n/a  CURRENT BISPHOSPHONATES USE: none  PAIN MANAGEMENT: Percocet  NARCOTICS INDUCED CONSTIPATION: occasional  LIVING WILL AND CODE STATUS: has a living will, DNR

## 2012-12-14 ENCOUNTER — Emergency Department (HOSPITAL_COMMUNITY): Payer: Medicare Other

## 2012-12-14 ENCOUNTER — Other Ambulatory Visit: Payer: Self-pay

## 2012-12-14 ENCOUNTER — Inpatient Hospital Stay (HOSPITAL_COMMUNITY)
Admission: EM | Admit: 2012-12-14 | Discharge: 2012-12-21 | DRG: 291 | Disposition: A | Discharge status: Other Acute Inpatient Hospital | Payer: Medicare Other | Attending: Internal Medicine | Admitting: Internal Medicine

## 2012-12-14 ENCOUNTER — Encounter (HOSPITAL_COMMUNITY): Payer: Self-pay | Admitting: Emergency Medicine

## 2012-12-14 DIAGNOSIS — J449 Chronic obstructive pulmonary disease, unspecified: Secondary | ICD-10-CM | POA: Diagnosis present

## 2012-12-14 DIAGNOSIS — I10 Essential (primary) hypertension: Secondary | ICD-10-CM | POA: Diagnosis present

## 2012-12-14 DIAGNOSIS — R0603 Acute respiratory distress: Secondary | ICD-10-CM | POA: Diagnosis present

## 2012-12-14 DIAGNOSIS — E785 Hyperlipidemia, unspecified: Secondary | ICD-10-CM | POA: Diagnosis present

## 2012-12-14 DIAGNOSIS — I5033 Acute on chronic diastolic (congestive) heart failure: Secondary | ICD-10-CM | POA: Diagnosis present

## 2012-12-14 DIAGNOSIS — C7970 Secondary malignant neoplasm of unspecified adrenal gland: Secondary | ICD-10-CM | POA: Diagnosis present

## 2012-12-14 DIAGNOSIS — I9589 Other hypotension: Secondary | ICD-10-CM | POA: Diagnosis present

## 2012-12-14 DIAGNOSIS — J90 Pleural effusion, not elsewhere classified: Secondary | ICD-10-CM | POA: Diagnosis present

## 2012-12-14 DIAGNOSIS — C3410 Malignant neoplasm of upper lobe, unspecified bronchus or lung: Secondary | ICD-10-CM | POA: Diagnosis present

## 2012-12-14 DIAGNOSIS — F17200 Nicotine dependence, unspecified, uncomplicated: Secondary | ICD-10-CM | POA: Diagnosis present

## 2012-12-14 DIAGNOSIS — G9340 Encephalopathy, unspecified: Secondary | ICD-10-CM | POA: Diagnosis not present

## 2012-12-14 DIAGNOSIS — Z833 Family history of diabetes mellitus: Secondary | ICD-10-CM | POA: Diagnosis not present

## 2012-12-14 DIAGNOSIS — I959 Hypotension, unspecified: Secondary | ICD-10-CM

## 2012-12-14 DIAGNOSIS — E876 Hypokalemia: Secondary | ICD-10-CM | POA: Diagnosis present

## 2012-12-14 DIAGNOSIS — Z7982 Long term (current) use of aspirin: Secondary | ICD-10-CM | POA: Diagnosis not present

## 2012-12-14 DIAGNOSIS — N170 Acute kidney failure with tubular necrosis: Secondary | ICD-10-CM | POA: Diagnosis present

## 2012-12-14 DIAGNOSIS — Z923 Personal history of irradiation: Secondary | ICD-10-CM | POA: Diagnosis not present

## 2012-12-14 DIAGNOSIS — J189 Pneumonia, unspecified organism: Secondary | ICD-10-CM | POA: Diagnosis present

## 2012-12-14 DIAGNOSIS — E119 Type 2 diabetes mellitus without complications: Secondary | ICD-10-CM | POA: Diagnosis present

## 2012-12-14 DIAGNOSIS — N179 Acute kidney failure, unspecified: Secondary | ICD-10-CM

## 2012-12-14 DIAGNOSIS — R112 Nausea with vomiting, unspecified: Secondary | ICD-10-CM | POA: Diagnosis not present

## 2012-12-14 DIAGNOSIS — E039 Hypothyroidism, unspecified: Secondary | ICD-10-CM | POA: Diagnosis present

## 2012-12-14 DIAGNOSIS — E1169 Type 2 diabetes mellitus with other specified complication: Secondary | ICD-10-CM | POA: Diagnosis present

## 2012-12-14 DIAGNOSIS — Z9221 Personal history of antineoplastic chemotherapy: Secondary | ICD-10-CM | POA: Diagnosis not present

## 2012-12-14 DIAGNOSIS — E871 Hypo-osmolality and hyponatremia: Secondary | ICD-10-CM | POA: Diagnosis present

## 2012-12-14 DIAGNOSIS — Z79899 Other long term (current) drug therapy: Secondary | ICD-10-CM | POA: Diagnosis not present

## 2012-12-14 DIAGNOSIS — R0602 Shortness of breath: Secondary | ICD-10-CM

## 2012-12-14 DIAGNOSIS — I319 Disease of pericardium, unspecified: Secondary | ICD-10-CM

## 2012-12-14 DIAGNOSIS — R109 Unspecified abdominal pain: Secondary | ICD-10-CM | POA: Diagnosis not present

## 2012-12-14 DIAGNOSIS — Z794 Long term (current) use of insulin: Secondary | ICD-10-CM | POA: Diagnosis not present

## 2012-12-14 DIAGNOSIS — I509 Heart failure, unspecified: Secondary | ICD-10-CM | POA: Diagnosis present

## 2012-12-14 DIAGNOSIS — C3490 Malignant neoplasm of unspecified part of unspecified bronchus or lung: Secondary | ICD-10-CM

## 2012-12-14 DIAGNOSIS — I313 Pericardial effusion (noninflammatory): Secondary | ICD-10-CM

## 2012-12-14 HISTORY — DX: Shortness of breath: R06.02

## 2012-12-14 LAB — HEMOGLOBIN A1C
Hgb A1c MFr Bld: 7.7 % — ABNORMAL HIGH (ref <5.7)
Mean Plasma Glucose: 174 mg/dL — ABNORMAL HIGH (ref <117)

## 2012-12-14 LAB — CBC WITH DIFFERENTIAL/PLATELET
Basophils Absolute: 0 10*3/uL (ref 0.0–0.1)
Basophils Relative: 0 % (ref 0–1)
Eosinophils Relative: 0 % (ref 0–5)
HCT: 39 % (ref 36.0–46.0)
Lymphocytes Relative: 18 % (ref 12–46)
Lymphs Abs: 1.1 10*3/uL (ref 0.7–4.0)
MCHC: 33.6 g/dL (ref 30.0–36.0)
MCV: 88.8 fL (ref 78.0–100.0)
Monocytes Absolute: 0.3 10*3/uL (ref 0.1–1.0)
Neutrophils Relative %: 76 % (ref 43–77)
Platelets: 229 10*3/uL (ref 150–400)
RDW: 14.6 % (ref 11.5–15.5)
WBC: 6.1 10*3/uL (ref 4.0–10.5)

## 2012-12-14 LAB — BLOOD GAS, ARTERIAL
Acid-Base Excess: 2.6 mmol/L — ABNORMAL HIGH (ref 0.0–2.0)
Drawn by: 295031
O2 Content: 4 L/min
O2 Saturation: 95.5 %
pO2, Arterial: 76.6 mmHg — ABNORMAL LOW (ref 80.0–100.0)

## 2012-12-14 LAB — URINALYSIS, ROUTINE W REFLEX MICROSCOPIC
Bilirubin Urine: NEGATIVE
Glucose, UA: >1000 mg/dL — AB
Ketones, ur: NEGATIVE mg/dL
Protein, ur: NEGATIVE mg/dL
Urobilinogen, UA: 0.2 mg/dL (ref 0.0–1.0)
pH: 6 (ref 5.0–8.0)

## 2012-12-14 LAB — APTT: aPTT: 28 seconds (ref 24–37)

## 2012-12-14 LAB — EXPECTORATED SPUTUM ASSESSMENT W GRAM STAIN, RFLX TO RESP C

## 2012-12-14 LAB — GLUCOSE, CAPILLARY
Glucose-Capillary: 218 mg/dL — ABNORMAL HIGH (ref 70–99)
Glucose-Capillary: 429 mg/dL — ABNORMAL HIGH (ref 70–99)
Glucose-Capillary: 413 mg/dL — ABNORMAL HIGH (ref 70–99)
Glucose-Capillary: 371 mg/dL — ABNORMAL HIGH (ref 70–99)

## 2012-12-14 LAB — PROTIME-INR: Prothrombin Time: 12.9 seconds (ref 11.6–15.2)

## 2012-12-14 LAB — COMPREHENSIVE METABOLIC PANEL
AST: 13 U/L (ref 0–37)
Albumin: 3.3 g/dL — ABNORMAL LOW (ref 3.5–5.2)
Calcium: 9 mg/dL (ref 8.4–10.5)
Creatinine, Ser: 0.51 mg/dL (ref 0.50–1.10)
GFR calc non Af Amer: 89 mL/min — ABNORMAL LOW (ref >90)
Glucose, Bld: 222 mg/dL — ABNORMAL HIGH (ref 70–99)
Sodium: 130 mEq/L — ABNORMAL LOW (ref 135–145)
Total Protein: 6.8 g/dL (ref 6.0–8.3)

## 2012-12-14 LAB — TROPONIN I
Troponin I: <0.3 ng/mL (ref <0.30)
Troponin I: <0.3 ng/mL (ref <0.30)

## 2012-12-14 LAB — CBC
Hemoglobin: 13.9 g/dL (ref 12.0–15.0)
MCH: 29.3 pg (ref 26.0–34.0)
MCHC: 33.4 g/dL (ref 30.0–36.0)
MCV: 87.6 fL (ref 78.0–100.0)
Platelets: 261 10*3/uL (ref 150–400)
RBC: 4.75 MIL/uL (ref 3.87–5.11)

## 2012-12-14 LAB — URINE MICROSCOPIC-ADD ON

## 2012-12-14 LAB — TSH: TSH: 1.275 u[IU]/mL (ref 0.350–4.500)

## 2012-12-14 LAB — PRO B NATRIURETIC PEPTIDE: Pro B Natriuretic peptide (BNP): 3571 pg/mL — ABNORMAL HIGH (ref 0–450)

## 2012-12-14 MED ORDER — ACETAMINOPHEN 325 MG PO TABS
650.0000 mg | ORAL_TABLET | ORAL | Status: DC | PRN
  Administered 2012-12-15 – 2012-12-18 (×4): 650 mg via ORAL
  Filled 2012-12-14 (×4): qty 2

## 2012-12-14 MED ORDER — INSULIN GLARGINE 100 UNIT/ML ~~LOC~~ SOLN: 42.0000 [IU] | Freq: Every day | SUBCUTANEOUS | Status: DC

## 2012-12-14 MED ORDER — INSULIN GLARGINE 100 UNIT/ML SOLOSTAR PEN: 0.4200 mL | PEN_INJECTOR | Freq: Every day | SUBCUTANEOUS | Status: DC

## 2012-12-14 MED ORDER — PROCHLORPERAZINE MALEATE 10 MG PO TABS
10.0000 mg | ORAL_TABLET | Freq: Four times a day (QID) | ORAL | Status: DC | PRN
  Filled 2012-12-14: qty 1

## 2012-12-14 MED ORDER — PANCRELIPASE (LIP-PROT-AMYL) 12000-38000 UNITS PO CPEP
1.0000 | ORAL_CAPSULE | Freq: Three times a day (TID) | ORAL | Status: DC
  Administered 2012-12-14 – 2012-12-21 (×21): 1 via ORAL
  Filled 2012-12-14 (×23): qty 1

## 2012-12-14 MED ORDER — IPRATROPIUM BROMIDE 0.02 % IN SOLN
0.5000 mg | Freq: Once | RESPIRATORY_TRACT | Status: AC
  Administered 2012-12-14: 0.5 mg via RESPIRATORY_TRACT
  Filled 2012-12-14: qty 2.5

## 2012-12-14 MED ORDER — FUROSEMIDE 10 MG/ML IJ SOLN
40.0000 mg | Freq: Two times a day (BID) | INTRAMUSCULAR | Status: DC
  Administered 2012-12-14 – 2012-12-16 (×4): 40 mg via INTRAVENOUS
  Filled 2012-12-14 (×5): qty 4

## 2012-12-14 MED ORDER — INSULIN ASPART 100 UNIT/ML ~~LOC~~ SOLN
20.0000 [IU] | Freq: Once | SUBCUTANEOUS | Status: AC
  Administered 2012-12-14: 18:00:00 20 [IU] via SUBCUTANEOUS

## 2012-12-14 MED ORDER — METOPROLOL TARTRATE 12.5 MG HALF TABLET
12.5000 mg | ORAL_TABLET | Freq: Two times a day (BID) | ORAL | Status: DC
  Administered 2012-12-14 – 2012-12-21 (×12): 12.5 mg via ORAL
  Filled 2012-12-14 (×15): qty 1

## 2012-12-14 MED ORDER — IPRATROPIUM BROMIDE 0.02 % IN SOLN
0.5000 mg | RESPIRATORY_TRACT | Status: DC | PRN
  Administered 2012-12-20: 0.5 mg via RESPIRATORY_TRACT

## 2012-12-14 MED ORDER — POTASSIUM CHLORIDE CRYS ER 20 MEQ PO TBCR
40.0000 meq | EXTENDED_RELEASE_TABLET | Freq: Once | ORAL | Status: AC
  Administered 2012-12-14: 40 meq via ORAL
  Filled 2012-12-14: qty 2

## 2012-12-14 MED ORDER — ASPIRIN EC 81 MG PO TBEC
81.0000 mg | DELAYED_RELEASE_TABLET | Freq: Every day | ORAL | Status: DC
  Administered 2012-12-14 – 2012-12-21 (×8): 81 mg via ORAL
  Filled 2012-12-14 (×8): qty 1

## 2012-12-14 MED ORDER — SODIUM CHLORIDE 0.9 % IJ SOLN: 3.0000 mL | INTRAMUSCULAR | Status: DC | PRN

## 2012-12-14 MED ORDER — ENOXAPARIN SODIUM 40 MG/0.4ML ~~LOC~~ SOLN
40.0000 mg | SUBCUTANEOUS | Status: DC
  Administered 2012-12-14 – 2012-12-16 (×3): 40 mg via SUBCUTANEOUS
  Filled 2012-12-14 (×4): qty 0.4

## 2012-12-14 MED ORDER — DEXTROSE 5 % IV SOLN
500.0000 mg | Freq: Once | INTRAVENOUS | Status: AC
  Administered 2012-12-14: 500 mg via INTRAVENOUS

## 2012-12-14 MED ORDER — SODIUM CHLORIDE 0.9 % IJ SOLN
3.0000 mL | Freq: Two times a day (BID) | INTRAMUSCULAR | Status: DC
  Administered 2012-12-14 – 2012-12-20 (×8): 3 mL via INTRAVENOUS

## 2012-12-14 MED ORDER — DEXTROSE 5 % IV SOLN
1.0000 g | Freq: Once | INTRAVENOUS | Status: AC
  Administered 2012-12-14: 1 g via INTRAVENOUS
  Filled 2012-12-14: qty 10

## 2012-12-14 MED ORDER — LISINOPRIL 5 MG PO TABS
5.0000 mg | ORAL_TABLET | Freq: Every day | ORAL | Status: DC
  Administered 2012-12-14 – 2012-12-17 (×4): 5 mg via ORAL
  Filled 2012-12-14 (×4): qty 1

## 2012-12-14 MED ORDER — ONDANSETRON HCL 4 MG/2ML IJ SOLN
4.0000 mg | Freq: Four times a day (QID) | INTRAMUSCULAR | Status: DC | PRN
  Filled 2012-12-14 (×3): qty 2

## 2012-12-14 MED ORDER — INSULIN ASPART 100 UNIT/ML ~~LOC~~ SOLN
5.0000 [IU] | Freq: Once | SUBCUTANEOUS | Status: AC
  Administered 2012-12-14: 22:00:00 5 [IU] via SUBCUTANEOUS

## 2012-12-14 MED ORDER — ONDANSETRON HCL 4 MG/2ML IJ SOLN
4.0000 mg | Freq: Four times a day (QID) | INTRAMUSCULAR | Status: DC | PRN
  Administered 2012-12-16 – 2012-12-17 (×5): 4 mg via INTRAVENOUS
  Filled 2012-12-14 (×2): qty 2

## 2012-12-14 MED ORDER — LEVOTHYROXINE SODIUM 112 MCG PO TABS
112.0000 ug | ORAL_TABLET | Freq: Every day | ORAL | Status: DC
  Administered 2012-12-14 – 2012-12-21 (×8): 112 ug via ORAL
  Filled 2012-12-14 (×9): qty 1

## 2012-12-14 MED ORDER — INSULIN ASPART 100 UNIT/ML ~~LOC~~ SOLN: 0.0000 [IU] | Freq: Three times a day (TID) | SUBCUTANEOUS | Status: DC

## 2012-12-14 MED ORDER — ALBUTEROL SULFATE (5 MG/ML) 0.5% IN NEBU: 2.5000 mg | INHALATION_SOLUTION | RESPIRATORY_TRACT | Status: DC | PRN

## 2012-12-14 MED ORDER — ALBUTEROL SULFATE (5 MG/ML) 0.5% IN NEBU
2.5000 mg | INHALATION_SOLUTION | RESPIRATORY_TRACT | Status: DC | PRN
  Administered 2012-12-20: 08:00:00 2.5 mg via RESPIRATORY_TRACT
  Filled 2012-12-14: qty 0.5

## 2012-12-14 MED ORDER — SODIUM CHLORIDE 0.9 % IV SOLN: 250.0000 mL | INTRAVENOUS | Status: DC | PRN

## 2012-12-14 MED ORDER — FUROSEMIDE 10 MG/ML IJ SOLN
40.0000 mg | Freq: Once | INTRAMUSCULAR | Status: AC
  Administered 2012-12-14: 40 mg via INTRAVENOUS
  Filled 2012-12-14: qty 4

## 2012-12-14 MED ORDER — CLONAZEPAM 1 MG PO TABS
1.0000 mg | ORAL_TABLET | Freq: Every day | ORAL | Status: DC
  Administered 2012-12-14 – 2012-12-20 (×6): 1 mg via ORAL
  Filled 2012-12-14 (×6): qty 1

## 2012-12-14 MED ORDER — INSULIN GLARGINE 100 UNIT/ML ~~LOC~~ SOLN
42.0000 [IU] | Freq: Every day | SUBCUTANEOUS | Status: DC
  Administered 2012-12-14: 18:00:00 42 [IU] via SUBCUTANEOUS
  Filled 2012-12-14: qty 0.42

## 2012-12-14 MED ORDER — DOXEPIN HCL 25 MG PO CAPS
25.0000 mg | ORAL_CAPSULE | Freq: Every day | ORAL | Status: DC
  Administered 2012-12-14 – 2012-12-20 (×6): 25 mg via ORAL
  Filled 2012-12-14 (×8): qty 1

## 2012-12-14 MED ORDER — DOCUSATE SODIUM 100 MG PO CAPS
100.0000 mg | ORAL_CAPSULE | Freq: Two times a day (BID) | ORAL | Status: DC
  Administered 2012-12-14 – 2012-12-21 (×12): 100 mg via ORAL
  Filled 2012-12-14 (×15): qty 1

## 2012-12-14 MED ORDER — ALBUTEROL SULFATE (5 MG/ML) 0.5% IN NEBU
2.5000 mg | INHALATION_SOLUTION | Freq: Once | RESPIRATORY_TRACT | Status: AC
  Administered 2012-12-14: 2.5 mg via RESPIRATORY_TRACT
  Filled 2012-12-14: qty 0.5

## 2012-12-14 MED ORDER — METHYLPREDNISOLONE SODIUM SUCC 125 MG IJ SOLR
125.0000 mg | INTRAMUSCULAR | Status: AC
  Administered 2012-12-14: 125 mg via INTRAVENOUS
  Filled 2012-12-14: qty 2

## 2012-12-14 NOTE — ED Notes (Signed)
Bed: WA20 Expected date:  Expected time:  Means of arrival:  Comments: ems- 79 yo F, SOB, 95% on CPAP

## 2012-12-14 NOTE — H&P (Signed)
[facility] History and Physical  Misty Ball NFA:[id number] DOB: 05/15/1931 DOA: 12/14/2012  Referring physician: Dr. Romeo Apple PCP: [name], MD  Specialists: Oncology: Dr. Arlis Porta  Chief Complaint: Worsening shortness of breath  HPI: Misty Ball is a 79 y.o. female  With medical history of hypertension, diabetes, hypothyroidism, extensive status stage small cell lung cancer diagnosed in May of 2014 status post chemotherapy in June of 2014 with 5 cycles last cycle 10/04/2012, status post palliative radiotherapy to the left lung field completed 08/14/2012 who presents to the ED with a several hour history of worsening shortness of breath which started at night, orthopnea, wheezing, gasping for breath. Patient does endorse weight gain states she usually weighs 151 pounds. Patient also endorses some diaphoresis, chills, generalized weakness, fatigue. Patient denies any fever, no nausea, no vomiting, no diarrhea, no dysuria, no chest pain. Patient does endorse a two-week history of a productive cough of whitish sputum. Patient was seen in the ED compressive metabolic profile which was done had a sodium of 130 potassium of 3.4 albumin of 3.3 otherwise was within normal limits. Pro BNP was elevated at 3571. CBC was within normal limits. EKG showed a normal sinus rhythm. Chest x-ray showed volume loss throughout the left hemithorax due to known left effusion and left lung cancer with small passive in the right midlung and lung base may be due to pneumonia or edema, cardiomegaly. Patient was given some nebulizer treatments, IV Rocephin and azithromycin, IV steroids in the ED. Will call to admit the patient for further evaluation and management.  Review of Systems: The patient denies anorexia, fever, weight loss,, vision loss, decreased hearing, hoarseness, chest pain, syncope, dyspnea on exertion, peripheral edema, balance deficits, hemoptysis, abdominal pain, melena,  hematochezia, severe indigestion/heartburn, hematuria, incontinence, genital sores, muscle weakness, suspicious skin lesions, transient blindness, difficulty walking, depression, unusual weight change, abnormal bleeding, enlarged lymph nodes, angioedema, and breast masses.   Past Medical History  Diagnosis Date  . Hypertension   . Pancreatitis   . Diabetes mellitus   . Thyroid disease   . lung ca dx'd 06/2012  . History of radiation therapy 07/20/2012-08/14/2012    37.5 Gy to left lung  . History of chemotherapy 07/12/2012    carboplatin and etoposide   Past Surgical History  Procedure Laterality Date  . Abdominal hysterectomy    . Cholecystectomy    . Appendectomy    . Video bronchoscopy Bilateral 05/10/2012    Procedure: VIDEO BRONCHOSCOPY WITHOUT FLUORO;  Surgeon: Nyoka Cowden, MD;  Location: [facility];  Service: Endoscopy;  Laterality: Bilateral;   Social History:  reports that she has been smoking Cigarettes.  She has been smoking about 0.00 packs per day for the past 52 years. She has never used smokeless tobacco. She reports that she does not drink alcohol or use illicit drugs.  Allergies  Allergen Reactions  . Sulfa Antibiotics Rash    Family History  Problem Relation Age of Onset  . Diabetes Mother   . Diabetes Daughter   . Diabetes Son   . Diabetes Son     Prior to Admission medications   Medication Sig Start Date End Date Taking? Authorizing Provider  aspirin EC 81 MG tablet Take 81 mg by mouth daily.   Yes Historical Provider, MD  clonazePAM (KLONOPIN) 0.5 MG tablet Take 2 tablets (1 mg total) by mouth at bedtime. 11/29/12  Yes [name], MD  doxepin (SINEQUAN) 25 MG capsule Take 1 capsule (25 mg total) by mouth  at bedtime. 11/29/12  Yes [name], MD  Insulin Glargine (LANTUS SOLOSTAR) 100 UNIT/ML SOPN Inject 0.42 mLs into the skin daily. 07/07/12  Yes [name], MD  levothyroxine (SYNTHROID, LEVOTHROID) 112 MCG tablet Take 1 tablet (112 mcg  total) by mouth daily. 11/29/12 11/29/13 Yes [name], MD  lidocaine-prilocaine (EMLA) cream Apply topically as needed. Apply to port 1 hour before chemotherapy 07/12/12  Yes Si Gaul, MD  lipase/protease/amylase (CREON-12/PANCREASE) 12000 UNITS CPEP capsule Take 1 capsule by mouth 3 (three) times daily before meals. 11/29/12  Yes [name], MD  prochlorperazine (COMPAZINE) 10 MG tablet Take 1 tablet (10 mg total) by mouth every 6 (six) hours as needed. 09/15/12  Yes Conni Slipper, PA-C  B-D ULTRAFINE III SHORT PEN 31G X 8 MM MISC USE AS DIRECTED EVERY MORNING 12/09/12   Nelva Nay, PA-C   Physical Exam: Filed Vitals:   12/14/12 1146  BP: 157/82  Pulse: 91  Temp: 98 F (36.7 C)  Resp: 28     General:  Well-developed well-nourished in no acute cardiopulmonary distress. Pale.  Eyes: Pupils equal around and reactive to light and accommodation. Extraocular movements intact.  ENT: Oropharynx is clear, no lesions, no exudates.  Neck: Supple with no lymphadenopathy.  Cardiovascular: Regular rate rhythm no murmurs rubs or gallops.  Respiratory: Decreased breath sounds in the bases. Minimal expiratory wheezing.  Abdomen: Soft, nontender, nondistended, positive bowel sounds.  Skin: No rashes or lesions.  Musculoskeletal: 4/5 bilateral lower extremity strength. 4/5 bilateral upper extremity strength. Trace to 1+ bilateral lower extremity edema.  Psychiatric: Normal mood. Normal affect. Fair insight. Fair judgment.  Neurologic: Alert and oriented x3. Cranial nerves II through XII are grossly intact. No focal deficits.  Labs on Admission:  Basic Metabolic Panel:  Recent Labs Lab 12/11/12 0918 12/14/12 0859  NA 137 130*  K 3.9 3.4*  CL  --  95*  CO2 26 25  GLUCOSE 225* 222*  BUN 15.9 16  CREATININE 0.8 0.51  CALCIUM 9.5 9.0   Liver Function Tests:  Recent Labs Lab 12/11/12 0918 12/14/12 0859  AST 15 13  ALT 9 8  ALKPHOS 92 90  BILITOT 0.38 0.4   PROT 6.8 6.8  ALBUMIN 3.1* 3.3*   No results found for this basename: LIPASE, AMYLASE,  in the last 168 hours No results found for this basename: AMMONIA,  in the last 168 hours CBC:  Recent Labs Lab 12/11/12 0917 12/14/12 0859  WBC 5.6 6.1  NEUTROABS 4.0 4.6  HGB 13.2 13.1  HCT 40.6 39.0  MCV 90.3 88.8  PLT 240 229   Cardiac Enzymes: No results found for this basename: CKTOTAL, CKMB, CKMBINDEX, TROPONINI,  in the last 168 hours  BNP (last 3 results)  Recent Labs  12/14/12 0859  PROBNP 3571.0*   CBG:  Recent Labs Lab 12/14/12 1142  GLUCAP 218*    Radiological Exams on Admission: Dg Chest 2 View  12/14/2012   CLINICAL DATA:  Shortness of breath, cough, chest tightness  EXAM: CHEST  2 VIEW  COMPARISON:  The CT chest of 12/11/2012 and chest x-ray of 05/17/2012  FINDINGS: As noted on recent CT, much of the opacity throughout the left hemi thorax is due to left effusion as well as known small cell carcinoma in the left lung. There is more opacity at the right lung base, and the considerations are that of superimposed pneumonia or edema. Cardiomegaly is stable. A right-sided palpable Port-A-Cath is present. Mild cardiomegaly is stable. [name]  IMPRESSION: 1. Volume loss throughout the left hemi thorax due to known left effusion and left lung carcinoma. 2. More opacity in the right mid lung and lung base may be due to pneumonia or edema. 3. Cardiomegaly.   Electronically Signed   By: Dwyane Dee M.D.   On: 12/14/2012 09:13    EKG: Independently reviewed. Normal sinus rhythm.  Assessment/Plan Principal Problem:   Acute respiratory distress Active Problems:   DM   HYPERLIPIDEMIA   HYPERTENSION   Malignant neoplasm of upper lobe, bronchus or lung   COPD (chronic obstructive pulmonary disease)   Acute exacerbation of CHF (congestive heart failure)   Hypokalemia  #1 acute respiratory distress Likely multifactorial secondary to probable acute CHF exacerbation, possible COPD  exacerbation in the setting of extensive small cell lung cancer which has progressed per CT scan of 12/11/2012. Will admit the patient to telemetry. Patient does have elevated BNP. EKG with normal sinus rhythm. We'll cycle cardiac enzymes every 6 hours x3. Check a 2-D echo. Check an ABG. Will place patient on Lasix 40 mg IV every 12 hours. Strict I/O. Daily weights. Follow.  #2 probable acute CHF exacerbation Patient presenting with worsening shortness of breath overnight with some orthopnea and states that had about a 3 pound weight gain. Patient with some wheezing. Patient does have cardiomegaly on chest x-ray. Chest x-ray with concern for possible edema versus infectious etiology. Patient however is afebrile with a normal white count. Patient's pro BNP is elevated. Will admit to telemetry. Cycle cardiac enzymes every 6 hours x3. Check a 2-D echo. We'll place on IV Lasix 40 mg IV every 12 hours. We'll place on a baby aspirin. Place on low-dose ACE inhibitor and beta blocker. Strict is/os. Daily weights. Follow.  #3 diabetes mellitus Check a hemoglobin A1c. Continue home dose Lantus. Sliding scale insulin.  #4 hypokalemia Check a magnesium level. Replete.  #5 COPD Check ABG stat. We'll place on meds as needed for now. Patient did receive a dose of IV antibiotics as well as IV steroids and nebulizer treatments and the EEG. If ABGs abnormal and suggestive of a COPD exacerbation that we'll place patient on scheduled meds, IV antibiotics, IV steroids.  #6 hypertension Will place patient on low-dose ACE inhibitor Lopressor secondary to possible acute CHF exacerbation and follow.  #7 extensive small cell lung cancer Will inform oncology of patient's admission.  #8 prophylaxis PPI for GI prophylaxis. Lovenox for DVT prophylaxis.  Code Status: Full Family Communication: Updated patient and son at bedside. Disposition Plan: Admit to telemetry.  Time spent: 65 mins  [facility] Pager [phone]  If 7PM-7AM, please contact night-coverage [email or web address] Password [email or web address] 12/14/2012, 1:17 PM

## 2012-12-14 NOTE — Progress Notes (Signed)
Spoke with patient regarding foley catheter insertion and patient is refusing at this time.  She states that she has had one in the past and did not have good results and was very uncomfortable.  Patient's daughter also concerned because patient has weakened immune system and she was worried about getting an infection.  Explained risks of foley catheter insertion.  Patient also able to use bedside commode with minimal difficulty, one assist standby and stated she will continue to do this for now.  Will continue to monitor.

## 2012-12-14 NOTE — ED Provider Notes (Addendum)
CSN: [id number]     Arrival date & time 12/14/12  0827 History   First MD Initiated Contact with Patient 12/14/12 [phone]     Chief Complaint  Patient presents with  . Shortness of Breath   (Consider location/radiation/quality/duration/timing/severity/associated sxs/prior Treatment) Patient is a 79 y.o. female presenting with shortness of breath. The history is provided by the patient.  Shortness of Breath Severity:  Moderate Onset quality:  Gradual Duration:  2 weeks Timing:  Constant Progression:  Worsening Chronicity:  New Context comment:  At rest Relieved by:  Nothing Worsened by:  Nothing tried Ineffective treatments:  None tried Associated symptoms: cough   Associated symptoms: no abdominal pain, no chest pain, no fever, no headaches, no neck pain and no vomiting   Cough:    Cough characteristics:  Productive   Sputum characteristics:  White   Severity:  Moderate   Onset quality:  Gradual   Duration:  2 weeks   Timing:  Constant   Progression:  Waxing and waning   Chronicity:  New   Past Medical History  Diagnosis Date  . Hypertension   . Pancreatitis   . Diabetes mellitus   . Thyroid disease   . lung ca dx'd 06/2012  . History of radiation therapy 07/20/2012-08/14/2012    37.5 Gy to left lung  . History of chemotherapy 07/12/2012    carboplatin and etoposide   Past Surgical History  Procedure Laterality Date  . Abdominal hysterectomy    . Cholecystectomy    . Appendectomy    . Video bronchoscopy Bilateral 05/10/2012    Procedure: VIDEO BRONCHOSCOPY WITHOUT FLUORO;  Surgeon: Nyoka Cowden, MD;  Location: [facility];  Service: Endoscopy;  Laterality: Bilateral;   Family History  Problem Relation Age of Onset  . Diabetes Mother   . Diabetes Daughter   . Diabetes Son   . Diabetes Son    History  Substance Use Topics  . Smoking status: Former Smoker -- 0.50 packs/day for 52 years    Types: Cigarettes    Quit date: 07/05/2012  . Smokeless tobacco: Never  Used  . Alcohol Use: No   OB History   Grav Para Term Preterm Abortions TAB SAB Ect Mult Living                 Review of Systems  Constitutional: Negative for fever and fatigue.  HENT: Negative for congestion and drooling.   Eyes: Negative for pain.  Respiratory: Positive for cough and shortness of breath.        Chest wall pain w/ coughing  Cardiovascular: Negative for chest pain.  Gastrointestinal: Negative for nausea, vomiting, abdominal pain and diarrhea.  Genitourinary: Negative for dysuria and hematuria.  Musculoskeletal: Negative for back pain, gait problem and neck pain.  Skin: Negative for color change.  Neurological: Negative for dizziness and headaches.  Hematological: Negative for adenopathy.  Psychiatric/Behavioral: Negative for behavioral problems.  All other systems reviewed and are negative.    Allergies  Sulfa antibiotics  Home Medications   Current Outpatient Rx  Name  Route  Sig  Dispense  Refill  . aspirin EC 81 MG tablet   Oral   Take 81 mg by mouth daily.         . B-D ULTRAFINE III SHORT PEN 31G X 8 MM MISC      USE AS DIRECTED EVERY MORNING   100 each   1   . clonazePAM (KLONOPIN) 0.5 MG tablet   Oral   Take  2 tablets (1 mg total) by mouth at bedtime.   90 tablet   1   . doxepin (SINEQUAN) 25 MG capsule   Oral   Take 1 capsule (25 mg total) by mouth at bedtime.   90 capsule   3   . hyaluronate sodium (RADIAPLEXRX) GEL   Topical   Apply 1 application topically 2 (two) times daily.         . Insulin Glargine (LANTUS SOLOSTAR) 100 UNIT/ML SOPN   Subcutaneous   Inject 0.42 mLs into the skin daily.   5 pen   11   . levothyroxine (SYNTHROID, LEVOTHROID) 112 MCG tablet   Oral   Take 1 tablet (112 mcg total) by mouth daily.   90 tablet   3   . lidocaine-prilocaine (EMLA) cream   Topical   Apply topically as needed. Apply to port 1 hour before chemotherapy   30 g   0   . lipase/protease/amylase (CREON-12/PANCREASE) 12000  UNITS CPEP capsule   Oral   Take 1 capsule by mouth 3 (three) times daily before meals.   270 capsule   6   . prochlorperazine (COMPAZINE) 10 MG tablet   Oral   Take 1 tablet (10 mg total) by mouth every 6 (six) hours as needed.   60 tablet   0   . triamcinolone cream (KENALOG) 0.1 %   Topical   Apply 1 application topically 2 (two) times daily.   454 g   4    BP 176/121  Pulse 96  Temp(Src) 98.4 F (36.9 C)  Resp 35  SpO2 92% Physical Exam  Nursing note and vitals reviewed. Constitutional: She is oriented to person, place, and time. She appears well-developed and well-nourished.  HENT:  Head: Normocephalic.  Mouth/Throat: Oropharynx is clear and moist. No oropharyngeal exudate.  Eyes: Conjunctivae and EOM are normal. Pupils are equal, round, and reactive to light.  Neck: Normal range of motion. Neck supple.  Cardiovascular: Normal rate, regular rhythm, normal heart sounds and intact distal pulses.  Exam reveals no gallop and no friction rub.   No murmur heard. Pulmonary/Chest: She is in respiratory distress (mild tachypnea). She has wheezes (mild diffuse wheezing).  Abdominal: Soft. Bowel sounds are normal. There is no tenderness. There is no rebound and no guarding.  Musculoskeletal: Normal range of motion. She exhibits no edema and no tenderness.  Neurological: She is alert and oriented to person, place, and time.  Skin: Skin is warm and dry.  Psychiatric: She has a normal mood and affect. Her behavior is normal.    ED Course  Procedures (including critical care time) Labs Review Labs Reviewed  COMPREHENSIVE METABOLIC PANEL - Abnormal; Notable for the following:    Sodium 130 (*)    Potassium 3.4 (*)    Chloride 95 (*)    Glucose, Bld 222 (*)    Albumin 3.3 (*)    GFR calc non Af Amer 89 (*)    All other components within normal limits  PRO B NATRIURETIC PEPTIDE - Abnormal; Notable for the following:    Pro B Natriuretic peptide (BNP) 3571.0 (*)    All  other components within normal limits  GLUCOSE, CAPILLARY - Abnormal; Notable for the following:    Glucose-Capillary 218 (*)    All other components within normal limits  CULTURE, EXPECTORATED SPUTUM-ASSESSMENT  URINE CULTURE  CULTURE, RESPIRATORY (NON-EXPECTORATED)  CBC WITH DIFFERENTIAL  BLOOD GAS, ARTERIAL  URINALYSIS, ROUTINE W REFLEX MICROSCOPIC  HEMOGLOBIN A1C  MAGNESIUM  TROPONIN I  TROPONIN I  TROPONIN I  TSH  PROTIME-INR  APTT  CBC  CREATININE, SERUM   Imaging Review Dg Chest 2 View  12/14/2012   CLINICAL DATA:  Shortness of breath, cough, chest tightness  EXAM: CHEST  2 VIEW  COMPARISON:  The CT chest of 12/11/2012 and chest x-ray of 05/17/2012  FINDINGS: As noted on recent CT, much of the opacity throughout the left hemi thorax is due to left effusion as well as known small cell carcinoma in the left lung. There is more opacity at the right lung base, and the considerations are that of superimposed pneumonia or edema. Cardiomegaly is stable. A right-sided palpable Port-A-Cath is present. Mild cardiomegaly is stable. [name]  IMPRESSION: 1. Volume loss throughout the left hemi thorax due to known left effusion and left lung carcinoma. 2. More opacity in the right mid lung and lung base may be due to pneumonia or edema. 3. Cardiomegaly.   Electronically Signed   By: Dwyane Dee M.D.   On: 12/14/2012 09:13    EKG Interpretation     Ventricular Rate:  91 PR Interval:  203 QRS Duration: 105 QT Interval:  377 QTC Calculation: 464 R Axis:   43 Text Interpretation:  Sinus rhythm Atrial premature complex Low voltage, extremity leads Borderline repolarization abnormality Non-specific t wave changes in V5-V6           MDM   1. PNA (pneumonia)   2. COPD (chronic obstructive pulmonary disease)   3. Acute respiratory distress   4. Acute exacerbation of CHF (congestive heart failure)   5. Hypokalemia   6. Malignant neoplasm of upper lobe, bronchus or lung, unspecified  laterality   7. Type II or unspecified type diabetes mellitus without mention of complication, not stated as uncontrolled    8:46 AM 79 y.o. female with a history of lung cancer presents with productive cough with white sputum and gradually worsening shortness of breath for the last 2 weeks. The patient denies any fevers, vomiting, diarrhea, or abdominal pain. She does note mild chest wall pain, but only with coughing. She is mildly tachypneic here, but afebrile and blood pressure stable. While PE is on the differential, I have a low suspicion for this and suspect a COPD exacerbation versus pneumonia. Will get breathing tx, labs, CXR.   Ordered Rocephin/Azithro for CAP. Will order lasix at request of hospitalist. Will admit to hospitalist.     Junius Argyle, MD 12/14/12 1535  Junius Argyle, MD 12/14/12 1537

## 2012-12-14 NOTE — Progress Notes (Signed)
Patient CBG 429, MD made aware. ORders to receive Novolog 20 units now and give Lantus 42 units now.  Will continue to monitor and recheck CBG within an hour.

## 2012-12-14 NOTE — Progress Notes (Signed)
  Echocardiogram 2D Echocardiogram has been performed.  Jorje Guild 12/14/2012, 3:21 PM

## 2012-12-14 NOTE — ED Notes (Signed)
MD at bedside. 

## 2012-12-14 NOTE — ED Notes (Signed)
Patient states that cancer is to hip and "glands" in axillary region. States last chemo was in June 2014, denies every receiving radiation.

## 2012-12-14 NOTE — ED Notes (Signed)
Patient transported to X-ray 

## 2012-12-14 NOTE — ED Notes (Signed)
Patient with Hx of chronic bronchitis, hyperthyroidism, and unspecified cancer reports from home by EMS for SOB and coughing yellow sputum. Per EMS patient is a&o at baseline, presents with rhonchi. EMS administered 5 mg Albuterol and 1 mg Atrovent en route.

## 2012-12-14 NOTE — Progress Notes (Signed)
   CARE MANAGEMENT ED NOTE 12/14/2012  Patient:  Misty Ball, Misty Ball   Account Number:  [id number]  Date Initiated:  12/14/2012  Documentation initiated by:  Edd Arbour  Subjective/Objective Assessment:   79 yr old female medicare/medicaid of [place] pt pcp kurt lauenstein admitted for acute respiratory distress,probable acute chf exacerbafter c/o sob O2 sat 84 % in ED resp 35 Placed on 4 l O2 with increase to 92% decreased to 3 L NA 130 BNP 3571     Subjective/Objective Assessment Detail:   see H& P     Action/Plan:   UR completed   Action/Plan Detail:   Anticipated DC Date:  12/17/2012     Status Recommendation to Physician:   Result of Recommendation:    Other ED Services  Consult Working Plan    DC Planning Services  Other    Choice offered to / List presented to:            Status of service:  Completed, signed off  ED Comments:   ED Comments Detail:

## 2012-12-15 ENCOUNTER — Encounter (HOSPITAL_COMMUNITY): Payer: Self-pay | Admitting: Interventional Cardiology

## 2012-12-15 ENCOUNTER — Inpatient Hospital Stay (HOSPITAL_COMMUNITY): Payer: Medicare Other

## 2012-12-15 DIAGNOSIS — E119 Type 2 diabetes mellitus without complications: Secondary | ICD-10-CM | POA: Diagnosis not present

## 2012-12-15 DIAGNOSIS — J189 Pneumonia, unspecified organism: Secondary | ICD-10-CM

## 2012-12-15 DIAGNOSIS — I5033 Acute on chronic diastolic (congestive) heart failure: Secondary | ICD-10-CM | POA: Diagnosis not present

## 2012-12-15 DIAGNOSIS — C3410 Malignant neoplasm of upper lobe, unspecified bronchus or lung: Secondary | ICD-10-CM

## 2012-12-15 DIAGNOSIS — I313 Pericardial effusion (noninflammatory): Secondary | ICD-10-CM | POA: Diagnosis present

## 2012-12-15 DIAGNOSIS — R0609 Other forms of dyspnea: Secondary | ICD-10-CM

## 2012-12-15 DIAGNOSIS — J984 Other disorders of lung: Secondary | ICD-10-CM

## 2012-12-15 DIAGNOSIS — R0602 Shortness of breath: Secondary | ICD-10-CM | POA: Insufficient documentation

## 2012-12-15 DIAGNOSIS — I319 Disease of pericardium, unspecified: Secondary | ICD-10-CM

## 2012-12-15 DIAGNOSIS — J90 Pleural effusion, not elsewhere classified: Secondary | ICD-10-CM

## 2012-12-15 DIAGNOSIS — I3139 Other pericardial effusion (noninflammatory): Secondary | ICD-10-CM | POA: Diagnosis present

## 2012-12-15 LAB — CBC
HCT: 39 % (ref 36.0–46.0)
Hemoglobin: 12.9 g/dL (ref 12.0–15.0)
MCHC: 33.1 g/dL (ref 30.0–36.0)
Platelets: 272 10*3/uL (ref 150–400)
RBC: 4.46 MIL/uL (ref 3.87–5.11)
RDW: 14.5 % (ref 11.5–15.5)
WBC: 6.9 10*3/uL (ref 4.0–10.5)

## 2012-12-15 LAB — BODY FLUID CELL COUNT WITH DIFFERENTIAL
Monocyte-Macrophage-Serous Fluid: 16 % — ABNORMAL LOW (ref 50–90)
Neutrophil Count, Fluid: 8 % (ref 0–25)
Total Nucleated Cell Count, Fluid: 353 cu mm (ref 0–1000)

## 2012-12-15 LAB — PROTEIN, TOTAL: Total Protein: 6.7 g/dL (ref 6.0–8.3)

## 2012-12-15 LAB — GLUCOSE, CAPILLARY
Glucose-Capillary: 205 mg/dL — ABNORMAL HIGH (ref 70–99)
Glucose-Capillary: 287 mg/dL — ABNORMAL HIGH (ref 70–99)
Glucose-Capillary: 334 mg/dL — ABNORMAL HIGH (ref 70–99)

## 2012-12-15 LAB — BASIC METABOLIC PANEL
CO2: 28 mEq/L (ref 19–32)
Chloride: 92 mEq/L — ABNORMAL LOW (ref 96–112)
Creatinine, Ser: 0.75 mg/dL (ref 0.50–1.10)
GFR calc Af Amer: >90 mL/min (ref >90)
Potassium: 4 mEq/L (ref 3.5–5.1)
Sodium: 129 mEq/L — ABNORMAL LOW (ref 135–145)

## 2012-12-15 LAB — URINE CULTURE: Culture: NO GROWTH

## 2012-12-15 LAB — TROPONIN I: Troponin I: <0.3 ng/mL (ref <0.30)

## 2012-12-15 LAB — PROTEIN, BODY FLUID: Total protein, fluid: 3.2 g/dL

## 2012-12-15 LAB — LACTATE DEHYDROGENASE, PLEURAL OR PERITONEAL FLUID

## 2012-12-15 LAB — PRO B NATRIURETIC PEPTIDE: Pro B Natriuretic peptide (BNP): 9868 pg/mL — ABNORMAL HIGH (ref 0–450)

## 2012-12-15 LAB — CHOLESTEROL, BODY FLUID

## 2012-12-15 MED ORDER — INSULIN ASPART 100 UNIT/ML ~~LOC~~ SOLN
0.0000 [IU] | Freq: Three times a day (TID) | SUBCUTANEOUS | Status: DC
  Administered 2012-12-15: 7 [IU] via SUBCUTANEOUS
  Administered 2012-12-15: 4 [IU] via SUBCUTANEOUS
  Administered 2012-12-15: 11 [IU] via SUBCUTANEOUS
  Administered 2012-12-16: 4 [IU] via SUBCUTANEOUS
  Administered 2012-12-16: 3 [IU] via SUBCUTANEOUS
  Administered 2012-12-17: 4 [IU] via SUBCUTANEOUS
  Administered 2012-12-17: 18:00:00 3 [IU] via SUBCUTANEOUS
  Administered 2012-12-17: 4 [IU] via SUBCUTANEOUS
  Administered 2012-12-18: 08:00:00 3 [IU] via SUBCUTANEOUS
  Administered 2012-12-18: 13:00:00 4 [IU] via SUBCUTANEOUS
  Administered 2012-12-18: 7 [IU] via SUBCUTANEOUS
  Administered 2012-12-19: 13:00:00 4 [IU] via SUBCUTANEOUS

## 2012-12-15 MED ORDER — GLUCERNA SHAKE PO LIQD
237.0000 mL | Freq: Every day | ORAL | Status: DC | PRN
  Filled 2012-12-15: qty 237

## 2012-12-15 MED ORDER — INSULIN GLARGINE 100 UNIT/ML ~~LOC~~ SOLN
45.0000 [IU] | Freq: Every day | SUBCUTANEOUS | Status: DC
  Administered 2012-12-15 – 2012-12-18 (×4): 45 [IU] via SUBCUTANEOUS
  Filled 2012-12-15 (×5): qty 0.45

## 2012-12-15 NOTE — Progress Notes (Signed)
Post thoracentesis film: No pneumothorax. Good lung expansion.     PCCM will f/u in AM on fluid studies but by gross inspection, fluid was a transudate.  Luisa Hart WrightMD

## 2012-12-15 NOTE — Care Management Note (Signed)
  Page 1 of 1   12/18/2012     5:12:15 PM   CARE MANAGEMENT NOTE 12/18/2012  Patient:  Misty Ball, Misty Ball   Account Number:  [id number]  Date Initiated:  12/15/2012  Documentation initiated by:  Colleen Can  Subjective/Objective Assessment:   dx shortness of breath; hx lung cancer     Action/Plan:   CM spoke with patient. Plans are for her to return to her home  where her son who lives with her will be caregiver. She already has RW and commode seat. [facility]ants [facility] if [facility] services are ordered.   Anticipated DC Date:  12/16/2012   Anticipated DC Plan:  HOME/SELF CARE      DC Planning Services  CM consult      Choice offered to / List presented to:             Status of service:  In process, will continue to follow Medicare Important Message given?   (If response is "NO", the following Medicare IM given date fields will be blank) Date Medicare IM given:   Date Additional Medicare IM given:    Discharge Disposition:    Per UR Regulation:  Reviewed for med. necessity/level of care/duration of stay  If discussed at Long Length of Stay Meetings, dates discussed:    Comments:  11/'11/2012 Colleen Can BSN RN CCm [phone] Pt plans to goo home when stable; CM will follow PCP-DR Laurenstein use [facility] on [address] Has prescription coverage

## 2012-12-15 NOTE — Progress Notes (Signed)
DIAGNOSIS: Extensive stage small cell lung cancer diagnosed in May of 2014   PRIOR THERAPY:  1) Systemic chemotherapy with carboplatin for AUC of 5 on day 1 and etoposide at 120 mg/M2 on days 1, 2 and 3 with Neulasta support on day 4, first dose started on 07/12/2012. Status post 5 cycles, last cycle was given on 10/04/2012.  2) palliative radiotherapy to the left lung field under the care of Dr. Mitzi Hansen completed on 08/14/2012.   CURRENT THERAPY: Systemic chemotherapy with cisplatin 30 mg/M2 and irinotecan 65 mg/M2 on days 1 and 8 every 3 weeks. First dose on 12/18/2012.   CHEMOTHERAPY INTENT: Palliative  CURRENT # OF CHEMOTHERAPY CYCLES: 1  CURRENT ANTIEMETICS: Zofran, dexamethasone,Compazine  CURRENT SMOKING STATUS: Former smoker, quit 5/228/2014  ORAL CHEMOTHERAPY AND CONSENT: n/a  CURRENT BISPHOSPHONATES USE: none  PAIN MANAGEMENT: Percocet  NARCOTICS INDUCED CONSTIPATION: occasional  LIVING WILL AND CODE STATUS: has a living will, DNR   Subjective: The patient is seen and examined today. She was admitted yesterday with worsening dyspnea that was felt to be secondary to her progressive small cell lung cancer as well as CHF exacerbation. Chest x-ray on admission was suspicious for pulmonary edema versus infectious etiology. The patient was started on IV lasix and she is feeling a little bit better this morning. She denied having any fever or chills. The patient denied having any nausea or vomiting.  Objective: Vital signs in last 24 hours: Temp:  [97.5 F (36.4 C)-98.2 F (36.8 C)] 97.5 F (36.4 C) (11/07 0545) Pulse Rate:  [77-94] 77 (11/07 0545) Resp:  [13-28] 20 (11/07 0545) BP: (117-157)/(67-82) 117/67 mmHg (11/07 0545) SpO2:  [93 %-100 %] 100 % (11/07 0545) Weight:  [145 lb 4.5 oz (65.9 kg)-154 lb (69.854 kg)] 145 lb 4.5 oz (65.9 kg) (11/07 0545)  Intake/Output from previous day: 11/06 0701 - 11/07 0700 In: 720 [P.O.:720] Out: 3700 [Urine:3700] Intake/Output this shift:    General appearance: alert, cooperative, fatigued and no distress Resp: diminished breath sounds LLL and dullness to percussion LLL Cardio: regular rate and rhythm, S1, S2 normal, no murmur, click, rub or gallop GI: soft, non-tender; bowel sounds normal; no masses,  no organomegaly Extremities: extremities normal, atraumatic, no cyanosis or edema  Lab Results:   Recent Labs  12/14/12 1510 12/15/12 0134  WBC 5.9 6.9  HGB 13.9 12.9  HCT 41.6 39.0  PLT 261 272   BMET  Recent Labs  12/14/12 0859 12/14/12 1510 12/15/12 0134  NA 130*  --  129*  K 3.4*  --  4.0  CL 95*  --  92*  CO2 25  --  28  GLUCOSE 222*  --  341*  BUN 16  --  22  CREATININE 0.51 0.58 0.75  CALCIUM 9.0  --  9.3    Studies/Results: Dg Chest 2 View  12/14/2012   CLINICAL DATA:  Shortness of breath, cough, chest tightness  EXAM: CHEST  2 VIEW  COMPARISON:  The CT chest of 12/11/2012 and chest x-ray of 05/17/2012  FINDINGS: As noted on recent CT, much of the opacity throughout the left hemi thorax is due to left effusion as well as known small cell carcinoma in the left lung. There is more opacity at the right lung base, and the considerations are that of superimposed pneumonia or edema. Cardiomegaly is stable. A right-sided palpable Port-A-Cath is present. Mild cardiomegaly is stable. [name]  IMPRESSION: 1. Volume loss throughout the left hemi thorax due to known left effusion and left  lung carcinoma. 2. More opacity in the right mid lung and lung base may be due to pneumonia or edema. 3. Cardiomegaly.   Electronically Signed   By: Dwyane Dee M.D.   On: 12/14/2012 09:13   Dg Chest Port 1 View  12/15/2012   CLINICAL DATA:  Shortness of breath, effusion  EXAM: PORTABLE CHEST - 1 VIEW  COMPARISON:  Prior radiograph from 12/14/2012  FINDINGS: Right-sided Port-A-Cath is stable in position. Cardiomegaly is unchanged.  There has been interval worsening of a left-sided effusion with decreased aeration of the left hemi thorax  as compared to the most recent examination. Pleural fluid is now seen capping the left lung apex. The known small cell lung cancer in the left lung is likely unchanged. The right hemi thorax is grossly clear. No pneumothorax.  Osseous structures are unchanged.  IMPRESSION: Interval worsening of left pleural effusion with decreased aeration of the left lung as compared to 12/14/12.   Electronically Signed   By: Rise Mu M.D.   On: 12/15/2012 05:54    Medications: I have reviewed the patient's current medications.  CODE STATUS: No CODE BLUE  Assessment/Plan: 1) progressive small cell lung cancer: The patient expected to start second line chemotherapy with cisplatin and irinotecan on 12/18/2012 if her condition improves by that time. 2) dyspnea: Secondary to #1 in addition to questionable CHF exacerbation. Continue IV Lasix. Thank you for taking good care of Ms. Mayford Knife, I will continue to follow the patient with you and assist in her management an as-needed basis.  LOS: 1 day    Brenlyn Beshara K. 12/15/2012

## 2012-12-15 NOTE — Progress Notes (Signed)
INITIAL NUTRITION ASSESSMENT  DOCUMENTATION CODES Per approved criteria  -Not Applicable   INTERVENTION: Provide Glucerna Shake once daily PRN Encourage intake of 3 balanced meals daily  NUTRITION DIAGNOSIS: Inadequate oral intake related to varied appetite as evidenced by 4% wt loss in less than 1 month.   Goal: Pt to meet >/= 90% of their estimated nutrition needs   Monitor:  PO intake Weight Labs  Reason for Assessment: Malnutrition Screening Tool, score of 3  79 y.o. female  Admitting Dx: Acute respiratory distress  ASSESSMENT: 79 y.o. female with medical history of hypertension, diabetes, hypothyroidism, extensive status stage small cell lung cancer diagnosed in May of 2014 status post chemotherapy in June of 2014 with 5 cycles last cycle 10/04/2012, status post palliative radiotherapy to the left lung field completed 08/14/2012 who presents to the ED with a several hour history of worsening shortness of breath which started at night, orthopnea, wheezing, gasping for breath.   Pt reports having a normal appetite which is fair at baseline. She states that PTA she was eating normally; she eats a good breakfast and vegetables and potatoes for dinner, she drinks one Ensure supplement every other day since she doesn't eat in between her 2 meals. She states that her usual body weight is 151 lbs. Per nursing notes, pt has eaten 100% of 3 meals since admission.   Height: Ht Readings from Last 1 Encounters:  12/14/12 5\' 4"  (1.626 m)    Weight: Wt Readings from Last 1 Encounters:  12/15/12 145 lb 4.5 oz (65.9 kg)    Ideal Body Weight: 120 lbs  % Ideal Body Weight: 121%  Wt Readings from Last 10 Encounters:  12/15/12 145 lb 4.5 oz (65.9 kg)  12/13/12 154 lb 3.2 oz (69.945 kg)  11/29/12 151 lb (68.493 kg)  10/11/12 146 lb (66.225 kg)  09/21/12 146 lb 6.4 oz (66.407 kg)  09/13/12 147 lb 9.6 oz (66.951 kg)  08/23/12 144 lb 14.4 oz (65.726 kg)  08/16/12 146 lb 12.8 oz  (66.588 kg)  08/10/12 146 lb 4.8 oz (66.361 kg)  08/03/12 148 lb 12.8 oz (67.495 kg)    Usual Body Weight: 151 lbs  % Usual Body Weight: 96%  BMI:  Body mass index is 24.93 kg/(m^2).  Estimated Nutritional Needs: Kcal: 1600-1800 Protein: 95-105 grams Fluid: 1.6-1.8 L/day  Skin: WDL  Diet Order: Cardiac  EDUCATION NEEDS: -No education needs identified at this time   Intake/Output Summary (Last 24 hours) at 12/15/12 1546 Last data filed at 12/15/12 1500  Gross per 24 hour  Intake    960 ml  Output   4100 ml  Net  -3140 ml    Last BM: 11/4   Labs:   Recent Labs Lab 12/11/12 0918 12/14/12 0859 12/14/12 1510 12/15/12 0134  NA 137 130*  --  129*  K 3.9 3.4*  --  4.0  CL  --  95*  --  92*  CO2 26 25  --  28  BUN 15.9 16  --  22  CREATININE 0.8 0.51 0.58 0.75  CALCIUM 9.5 9.0  --  9.3  MG  --   --  1.9  --   GLUCOSE 225* 222*  --  341*    CBG (last 3)   Recent Labs  12/15/12 0006 12/15/12 0809 12/15/12 1203  GLUCAP 334* 205* 274*    Scheduled Meds: . aspirin EC  81 mg Oral Daily  . clonazePAM  1 mg Oral QHS  . docusate sodium  100 mg Oral BID  . doxepin  25 mg Oral QHS  . enoxaparin (LOVENOX) injection  40 mg Subcutaneous Q24H  . furosemide  40 mg Intravenous Q12H  . insulin aspart  0-20 Units Subcutaneous TID WC  . insulin glargine  45 Units Subcutaneous QHS  . levothyroxine  112 mcg Oral Q breakfast  . lipase/protease/amylase  1 capsule Oral TID AC  . lisinopril  5 mg Oral Daily  . metoprolol tartrate  12.5 mg Oral Q12H  . sodium chloride  3 mL Intravenous Q12H    Continuous Infusions:   Past Medical History  Diagnosis Date  . Hypertension   . Pancreatitis   . Diabetes mellitus   . Thyroid disease   . lung ca dx'd 06/2012  . History of radiation therapy 07/20/2012-08/14/2012    37.5 Gy to left lung  . History of chemotherapy 07/12/2012    carboplatin and etoposide  . Shortness of breath     Past Surgical History  Procedure  Laterality Date  . Abdominal hysterectomy    . Cholecystectomy    . Appendectomy    . Video bronchoscopy Bilateral 05/10/2012    Procedure: VIDEO BRONCHOSCOPY WITHOUT FLUORO;  Surgeon: Nyoka Cowden, MD;  Location: [facility];  Service: Endoscopy;  Laterality: Bilateral;    Ian Malkin RD, LDN Inpatient Clinical Dietitian Pager: [phone] After Hours Pager: [phone]

## 2012-12-15 NOTE — Procedures (Signed)
Thoracentesis Procedure Note  Pre-operative Diagnosis: pleural effusion   Post-operative Diagnosis: same  Indications: dyspnea and fluid analysis   Procedure Details  Consent: Informed consent was obtained. Risks of the procedure were discussed including: infection, bleeding, pain, pneumothorax.  Under sterile conditions the patient was positioned. Betadine solution and sterile drapes were utilized.  1% buffered lidocaine was used to anesthetize the 8th rib space. Fluid was obtained without any difficulties and minimal blood loss.  A dressing was applied to the wound and wound care instructions were provided.   Findings 1700 ml of clear pleural fluid was obtained. A sample was sent to Pathology for cytogenetics, flow, and cell counts, as well as for infection analysis.  Complications:  None; patient tolerated the procedure well.          Condition: stable  Plan A follow up chest x-ray was ordered. Bed Rest for 0 hours. Tylenol 650 mg. for pain.  Attending Attestation: I was present for the entire procedure.    I was present for this procedure Shan Levans

## 2012-12-15 NOTE — Progress Notes (Signed)
Inpatient Diabetes Program Recommendations  AACE/ADA: New Consensus Statement on Inpatient Glycemic Control (2013)  Target Ranges:  Prepandial:   less than 140 mg/dL      Peak postprandial:   less than 180 mg/dL (1-2 hours)      Critically ill patients:  140 - 180 mg/dL   Reason for Visit: Hyperglycemia  Results for [name], [name] (MRN [id number]) as of 12/15/2012 15:07  Ref. Range 12/14/2012 11:42 12/14/2012 17:33 12/14/2012 18:32 12/14/2012 20:42 12/15/2012 00:06 12/15/2012 08:09 12/15/2012 12:03  Glucose-Capillary Latest Range: 70-99 mg/dL 952 (H) 841 (H) 324 (H) 371 (H) 334 (H) 205 (H) 274 (H)  Results for [name], [name] (MRN [id number]) as of 12/15/2012 15:07  Ref. Range 12/14/2012 09:00  Hemoglobin A1C Latest Range: <5.7 % 7.7 (H)    Inpatient Diabetes Program Recommendations Insulin - Basal: Increased Lantus to 45 units QHS Correction (SSI): Add Novolog HS correction scale Insulin - Meal Coverage: Add Novolog meal coverage insulin - 4 units tidwc if pt eats >50% meal HgbA1C: 7.7% - uncontrolled Diet: Please add CHO mod med to heart healthy diet  Note: Will continue to follow. Thank you. Ailene Ards, RD, LDN, CDE Inpatient Diabetes Coordinator [phone]

## 2012-12-15 NOTE — Consult Note (Signed)
PULMONARY  / CRITICAL CARE MEDICINE  Name: Misty Ball MRN: [id number] DOB: 08-May-1931    ADMISSION DATE:  12/14/2012 CONSULTATION DATE:  11/7 REFERRING MD :  Janee Morn PRIMARY SERVICE:  [facility]  CHIEF COMPLAINT:   Dyspnea/pleural effusion   BRIEF PATIENT DESCRIPTION:  79 year old female w/ known h/o extensive small cell lung cancer (dx May 2014), s/p 5 cycles of palliative chemo-therapy (cisplatin/irinotecan last on 8/27), and XRT left lung last completed 7/7.  Most recent scans showing progression of disease including LUL, new left effusion and now increased adrenal involvement. Admitted 11/6 w/ wt gain, decreased activity and increased dyspnea. PCCM called to see to eval left effusion.   SIGNIFICANT EVENTS / STUDIES:  CT chest/abd/pelvis 11/3: 1. Interval progression of small cell lung cancer, as evidenced by increasing consolidation in the left upper lobe, new moderate left  pleural effusion and enlarging bilateral adrenal metastases. 2. Areas of sclerosis in the iliac wings appear stable. 3. Borderline ascending aortic aneurysm. Enlarged pulmonary arteries are indicative of pulmonary arterial hypertension. Extensive coronary artery calcifications. 4. Possible left renal stones. 5. Increase in prominence of mesenteric lymph nodes ECHO 11/6: Left ventricle: Poor acoustic windows limit study Overall LVEF is normal at approximately 50 to 55% with hypokinesis of the inferior and posterior walls. The cavity size was mildly dilated. Wall thickness was increased in a pattern of moderate LVH. Doppler parameters are consistent with abnormal left ventricular relaxation (grade 1 diastolic dysfunction) large pericardial effusion  LINES / TUBES: PIV portacath  CULTURES: Sputum 11/6>>>  ANTIBIOTICS: azith 11/6X1 roceph 11/6 X 1  HISTORY OF PRESENT ILLNESS:   79 year old female w/ known h/o extensive small cell lung cancer (dx May 2014), s/p 5 cycles of palliative chemo-therapy  (cisplatin/irinotecan last on 8/27), and XRT left lung last completed 7/7.  Admitted on 11/6 w/ report of wt gain, orthopnea, wheezing and progressive shortness of breath. Also reporting occ chills and weakness. Admitted to the IM service w/ working dx of CHF exacerbation +/- exacerbation of COPD. Dx eval included CXR that showed: volume loss on left and worsening interstitial changes on the right . CT chest was obtained just 3 d prior to admit showed evidence of progression of disease in the abd/pelvis as well as increased LUL consolidation w/ new left effusion, ECHO obtained 11/6 showed EF 50-55% w/ hypokinesis of inferior/posterior walls, mod LVH, gd I diastolic dysfxn, large pericardial effusion. Treatment to date has included: scheduled bds, empiric abx and IV diuresis. She has improved with these interventions from a pulm stand-point.  PCCM was asked to see on 11/7 given finding of left effusion and concern that this could represent malignant effusion.   PAST MEDICAL HISTORY :  Past Medical History  Diagnosis Date  . Hypertension   . Pancreatitis   . Diabetes mellitus   . Thyroid disease   . lung ca dx'd 06/2012  . History of radiation therapy 07/20/2012-08/14/2012    37.5 Gy to left lung  . History of chemotherapy 07/12/2012    carboplatin and etoposide   Past Surgical History  Procedure Laterality Date  . Abdominal hysterectomy    . Cholecystectomy    . Appendectomy    . Video bronchoscopy Bilateral 05/10/2012    Procedure: VIDEO BRONCHOSCOPY WITHOUT FLUORO;  Surgeon: Nyoka Cowden, MD;  Location: [facility];  Service: Endoscopy;  Laterality: Bilateral;   Prior to Admission medications   Medication Sig Start Date End Date Taking? Authorizing Provider  aspirin EC 81 MG  tablet Take 81 mg by mouth daily.   Yes Historical Provider, MD  clonazePAM (KLONOPIN) 0.5 MG tablet Take 2 tablets (1 mg total) by mouth at bedtime. 11/29/12  Yes [name], MD  doxepin (SINEQUAN) 25 MG capsule  Take 1 capsule (25 mg total) by mouth at bedtime. 11/29/12  Yes [name], MD  Insulin Glargine (LANTUS SOLOSTAR) 100 UNIT/ML SOPN Inject 0.42 mLs into the skin daily. 07/07/12  Yes [name], MD  levothyroxine (SYNTHROID, LEVOTHROID) 112 MCG tablet Take 1 tablet (112 mcg total) by mouth daily. 11/29/12 11/29/13 Yes [name], MD  lidocaine-prilocaine (EMLA) cream Apply topically as needed. Apply to port 1 hour before chemotherapy 07/12/12  Yes Si Gaul, MD  lipase/protease/amylase (CREON-12/PANCREASE) 12000 UNITS CPEP capsule Take 1 capsule by mouth 3 (three) times daily before meals. 11/29/12  Yes [name], MD  prochlorperazine (COMPAZINE) 10 MG tablet Take 1 tablet (10 mg total) by mouth every 6 (six) hours as needed. 09/15/12  Yes Conni Slipper, PA-C  B-D ULTRAFINE III SHORT PEN 31G X 8 MM MISC USE AS DIRECTED EVERY MORNING 12/09/12   Nelva Nay, PA-C   Allergies  Allergen Reactions  . Sulfa Antibiotics Rash    FAMILY HISTORY:  Family History  Problem Relation Age of Onset  . Diabetes Mother   . Diabetes Daughter   . Diabetes Son   . Diabetes Son    SOCIAL HISTORY:  reports that she has been smoking Cigarettes.  She has been smoking about 0.00 packs per day for the past 52 years. She has never used smokeless tobacco. She reports that she does not drink alcohol or use illicit drugs.  REVIEW OF SYSTEMS:   Review of Systems:   Bolds are positive  Constitutional: weight loss, gain, night sweats, Fevers, chills, fatigue .  HEENT: headaches, Sore throat, sneezing, nasal congestion, post nasal drip, Difficulty swallowing, Tooth/dental problems, visual complaints visual changes, ear ache CV:  chest pain, radiates: ,Orthopnea, PND, swelling in lower extremities, dizziness, palpitations, syncope.  GI  heartburn, indigestion, abdominal pain, nausea, vomiting, diarrhea, change in bowel habits, loss of appetite, bloody stools.  Resp: cough, productive, white  sputum: , hemoptysis, dyspnea, chest pain, pleuritic.  Skin: rash or itching or icterus GU: dysuria, change in color of urine, urgency or frequency. flank pain, hematuria  MS: joint pain or swelling. decreased range of motion  Psych: change in mood or affect. depression or anxiety.  Neuro: difficulty with speech, weakness, numbness, ataxia    SUBJECTIVE:  Feels better  VITAL SIGNS: Temp:  [97.5 F (36.4 C)-98.2 F (36.8 C)] 97.5 F (36.4 C) (11/07 0545) Pulse Rate:  [77-94] 77 (11/07 0545) Resp:  [20-28] 20 (11/07 0545) BP: (117-157)/(67-82) 117/67 mmHg (11/07 0545) SpO2:  [93 %-100 %] 100 % (11/07 0545) Weight:  [65.9 kg (145 lb 4.5 oz)-69.854 kg (154 lb)] 65.9 kg (145 lb 4.5 oz) (11/07 0545)  PHYSICAL EXAMINATION: General: ill appearing WF in NAD  Neuro:  Awake and alert, no focal deficits HEENT:  EOMI, PERRLA, oral cavity normal Neck:  No tmg, mild jvd Cardiovascular:  RRR nl s1 s2 no s3/s4  Lungs:  Decrease BS 1/2 way up on L Abdomen:  Soft nt no hsm Musculoskeletal:  From  Skin:  Clear    Recent Labs Lab 12/11/12 0918 12/14/12 0859 12/14/12 1510 12/15/12 0134  NA 137 130*  --  129*  K 3.9 3.4*  --  4.0  CL  --  95*  --  92*  CO2 26 25  --  28  BUN 15.9 16  --  22  CREATININE 0.8 0.51 0.58 0.75  GLUCOSE 225* 222*  --  341*    Recent Labs Lab 12/14/12 0859 12/14/12 1510 12/15/12 0134  HGB 13.1 13.9 12.9  HCT 39.0 41.6 39.0  WBC 6.1 5.9 6.9  PLT 229 261 272   Dg Chest 2 View  12/14/2012   CLINICAL DATA:  Shortness of breath, cough, chest tightness  EXAM: CHEST  2 VIEW  COMPARISON:  The CT chest of 12/11/2012 and chest x-ray of 05/17/2012  FINDINGS: As noted on recent CT, much of the opacity throughout the left hemi thorax is due to left effusion as well as known small cell carcinoma in the left lung. There is more opacity at the right lung base, and the considerations are that of superimposed pneumonia or edema. Cardiomegaly is stable. A right-sided  palpable Port-A-Cath is present. Mild cardiomegaly is stable. [name]  IMPRESSION: 1. Volume loss throughout the left hemi thorax due to known left effusion and left lung carcinoma. 2. More opacity in the right mid lung and lung base may be due to pneumonia or edema. 3. Cardiomegaly.   Electronically Signed   By: Dwyane Dee M.D.   On: 12/14/2012 09:13   Dg Chest Port 1 View  12/15/2012   CLINICAL DATA:  Shortness of breath, effusion  EXAM: PORTABLE CHEST - 1 VIEW  COMPARISON:  Prior radiograph from 12/14/2012  FINDINGS: Right-sided Port-A-Cath is stable in position. Cardiomegaly is unchanged.  There has been interval worsening of a left-sided effusion with decreased aeration of the left hemi thorax as compared to the most recent examination. Pleural fluid is now seen capping the left lung apex. The known small cell lung cancer in the left lung is likely unchanged. The right hemi thorax is grossly clear. No pneumothorax.  Osseous structures are unchanged.  IMPRESSION: Interval worsening of left pleural effusion with decreased aeration of the left lung as compared to 12/14/12.   Electronically Signed   By: Rise Mu M.D.   On: 12/15/2012 05:54    ASSESSMENT / PLAN:  1) Dyspnea likely in the setting of decompensated diastolic heart failure w/ pulmonary edema, super-imposed on underlying Progressive metastatic Small Cell Lung Cancer involving left lung. Progressive pleural effusion  2)moderate pericardial effusion Rec: Cont diuresis Agree w/ cards eval Wean FIO2  3) New left effusion. Highest on Diff dx include malignant vs heart failure related. Rec: Therapeutic thoracentesis Can send fluid for cytology  Dorcas Carrow Beeper  [phone]  Cell  [phone]  If no response or cell goes to voicemail, call beeper [phone]  Pulmonary and Critical Care Medicine [facility] Pager: [phone]  12/15/2012, 10:23 AM

## 2012-12-15 NOTE — Consult Note (Addendum)
Admit date: 12/14/2012 Referring Physician  Dr. Shirline Frees Primary Physician  Dr. Milus Glazier Primary Cardiologist  Natalija Mavis-new Reason for Consultation  pericardial effusion  HPI: 79 year-old and with small cell lung cancer.  She was admitted with shortness of breath. Workup showed a large left pleural effusion and a pericardial effusion. The pericardial effusion was not thought to be causing tamponade. She had a therapeutic thoracentesis in which 1700 cc of fluid were removed. Her breathing has improved.  Pulmonary vascular congestion was still noted.  Echocardiogram revealed an ejection fraction 50-55% with inferior posterior hypokinesis. The patient denies any chest pain. Her main issue his breathing. She does not typically use oxygen at home, but is currently on 4 L nasal cannula. She has never had any prior cardiac workup. She does not recall ever having a stress test.     PMH:   Past Medical History  Diagnosis Date  . Hypertension   . Pancreatitis   . Diabetes mellitus   . Thyroid disease   . lung ca dx'd 06/2012  . History of radiation therapy 07/20/2012-08/14/2012    37.5 Gy to left lung  . History of chemotherapy 07/12/2012    carboplatin and etoposide     PSH:   Past Surgical History  Procedure Laterality Date  . Abdominal hysterectomy    . Cholecystectomy    . Appendectomy    . Video bronchoscopy Bilateral 05/10/2012    Procedure: VIDEO BRONCHOSCOPY WITHOUT FLUORO;  Surgeon: Nyoka Cowden, MD;  Location: [facility];  Service: Endoscopy;  Laterality: Bilateral;    Allergies:  Sulfa antibiotics Prior to Admit Meds:   Prescriptions prior to admission  Medication Sig Dispense Refill  . aspirin EC 81 MG tablet Take 81 mg by mouth daily.      . clonazePAM (KLONOPIN) 0.5 MG tablet Take 2 tablets (1 mg total) by mouth at bedtime.  90 tablet  1  . doxepin (SINEQUAN) 25 MG capsule Take 1 capsule (25 mg total) by mouth at bedtime.  90 capsule  3  . Insulin Glargine (LANTUS  SOLOSTAR) 100 UNIT/ML SOPN Inject 0.42 mLs into the skin daily.  5 pen  11  . levothyroxine (SYNTHROID, LEVOTHROID) 112 MCG tablet Take 1 tablet (112 mcg total) by mouth daily.  90 tablet  3  . lidocaine-prilocaine (EMLA) cream Apply topically as needed. Apply to port 1 hour before chemotherapy  30 g  0  . lipase/protease/amylase (CREON-12/PANCREASE) 12000 UNITS CPEP capsule Take 1 capsule by mouth 3 (three) times daily before meals.  270 capsule  6  . prochlorperazine (COMPAZINE) 10 MG tablet Take 1 tablet (10 mg total) by mouth every 6 (six) hours as needed.  60 tablet  0  . B-D ULTRAFINE III SHORT PEN 31G X 8 MM MISC USE AS DIRECTED EVERY MORNING  100 each  1   Fam HX:    Family History  Problem Relation Age of Onset  . Diabetes Mother   . Diabetes Daughter   . Diabetes Son   . Diabetes Son    Social HX:    History   Social History  . Marital Status: Widowed    Spouse Name: N/A    Number of Children: N/A  . Years of Education: N/A   Occupational History  . Not on file.   Social History Main Topics  . Smoking status: Current Some Day Smoker -- 0.00 packs/day for 52 years    Types: Cigarettes    Last Attempt to Quit: 07/05/2012  .  Smokeless tobacco: Never Used  . Alcohol Use: No  . Drug Use: No  . Sexual Activity: No   Other Topics Concern  . Not on file   Social History Narrative   Widow. Education: Grade.       ROS:  All 11 ROS were addressed and are negative except what is stated in the HPI  Physical Exam: Blood pressure 165/68, pulse 80, temperature 97.7 F (36.5 C), temperature source Oral, resp. rate 22, height 5\' 4"  (1.626 m), weight 145 lb 4.5 oz (65.9 kg), SpO2 96.00%. General: Frail, in no acute distress Head:    Normal cephalic and atramatic  Lungs:   Bibasilar crackles. Heart:  HRRR S1 S2 No JVD.  Abdomen:  abdomen soft and non-tender  Msk:  Back normal, normal gait. Normal strength and tone for age. Extremities:  Trace lower extremity edema.  2+  right radial pulse, and no change in the pulse with deep respiration Neuro: Alert and oriented  Psych:  Normal affect, responds appropriately    Labs:   Lab Results  Component Value Date   WBC 6.9 12/15/2012   HGB 12.9 12/15/2012   HCT 39.0 12/15/2012   MCV 87.4 12/15/2012   PLT 272 12/15/2012    Recent Labs Lab 12/14/12 0859  12/15/12 0134  NA 130*  --  129*  K 3.4*  --  4.0  CL 95*  --  92*  CO2 25  --  28  BUN 16  --  22  CREATININE 0.51  < > 0.75  CALCIUM 9.0  --  9.3  PROT 6.8  --  6.7  BILITOT 0.4  --   --   ALKPHOS 90  --   --   ALT 8  --   --   AST 13  --   --   GLUCOSE 222*  --  341*  < > = values in this interval not displayed. No results found for this basename: PTT   Lab Results  Component Value Date   INR 0.99 12/14/2012   INR 0.98 07/31/2012   INR 0.95 06/22/2012   Lab Results  Component Value Date   CKTOTAL 56 08/30/2010   CKMB 1.0 08/30/2010   TROPONINI <0.30 12/15/2012     Lab Results  Component Value Date   CHOL  Value: 136        ATP III CLASSIFICATION:  <200     mg/dL   Desirable  782-956  mg/dL   Borderline High  >=213    mg/dL   High        0/86/5784   CHOL  Value: 100        ATP III CLASSIFICATION:  <200     mg/dL   Desirable  696-295  mg/dL   Borderline High  >=284    mg/dL   High        02/10/2438   Lab Results  Component Value Date   HDL 36* 03/01/2010   HDL 31* 06/11/2008   Lab Results  Component Value Date   LDLCALC  Value: 77        Total Cholesterol/HDL:CHD Risk Coronary Heart Disease Risk Table                     Men   Women  1/2 Average Risk   3.4   3.3  Average Risk       5.0   4.4  2 X Average Risk   9.6  7.1  3 X Average Risk  23.4   11.0        Use the calculated Patient Ratio above and the CHD Risk Table to determine the patient's CHD Risk.        ATP III CLASSIFICATION (LDL):  <100     mg/dL   Optimal  191-478  mg/dL   Near or Above                    Optimal  130-159  mg/dL   Borderline  295-621  mg/dL   High  >308     mg/dL   Very  High 6/57/8469   LDLCALC  Value: 53        Total Cholesterol/HDL:CHD Risk Coronary Heart Disease Risk Table                     Men   Women  1/2 Average Risk   3.4   3.3  Average Risk       5.0   4.4  2 X Average Risk   9.6   7.1  3 X Average Risk  23.4   11.0        Use the calculated Patient Ratio above and the CHD Risk Table to determine the patient's CHD Risk.        ATP III CLASSIFICATION (LDL):  <100     mg/dL   Optimal  629-528  mg/dL   Near or Above                    Optimal  130-159  mg/dL   Borderline  413-244  mg/dL   High  >010     mg/dL   Very High 03/17/2534   Lab Results  Component Value Date   TRIG 117 03/01/2010   TRIG 80 06/11/2008   Lab Results  Component Value Date   CHOLHDL 3.8 03/01/2010   CHOLHDL 3.2 06/11/2008   No results found for this basename: LDLDIRECT      Radiology:  Dg Chest 2 View  12/14/2012   CLINICAL DATA:  Shortness of breath, cough, chest tightness  EXAM: CHEST  2 VIEW  COMPARISON:  The CT chest of 12/11/2012 and chest x-ray of 05/17/2012  FINDINGS: As noted on recent CT, much of the opacity throughout the left hemi thorax is due to left effusion as well as known small cell carcinoma in the left lung. There is more opacity at the right lung base, and the considerations are that of superimposed pneumonia or edema. Cardiomegaly is stable. A right-sided palpable Port-A-Cath is present. Mild cardiomegaly is stable. [name]  IMPRESSION: 1. Volume loss throughout the left hemi thorax due to known left effusion and left lung carcinoma. 2. More opacity in the right mid lung and lung base may be due to pneumonia or edema. 3. Cardiomegaly.   Electronically Signed   By: Dwyane Dee M.D.   On: 12/14/2012 09:13   Dg Chest Port 1 View  12/15/2012   CLINICAL DATA:  Status post left thoracentesis.  EXAM: PORTABLE CHEST - 1 VIEW  COMPARISON:  Multiple priors  FINDINGS: Right Port-A-Cath is unchanged in position. Cardiomediastinal silhouette is unchanged. Interval decrease in left pleural  effusion following thoracentesis. Opacity is again identified at the left lung apex and left base. Mild right basilar opacity is noted. There is prominence of the interstitial markings.  IMPRESSION: 1. Improvement in left lung aeration following thoracentesis with residual left basilar opacity.  No pneumothorax.  2.  Mild right basilar opacity, likely atelectasis.  3.  Continued left upper lobe opacity.  4.  Pulmonary vascular congestion.   Electronically Signed   By: Jerene Dilling M.D.   On: 12/15/2012 12:01   Dg Chest Port 1 View  12/15/2012   CLINICAL DATA:  Shortness of breath, effusion  EXAM: PORTABLE CHEST - 1 VIEW  COMPARISON:  Prior radiograph from 12/14/2012  FINDINGS: Right-sided Port-A-Cath is stable in position. Cardiomegaly is unchanged.  There has been interval worsening of a left-sided effusion with decreased aeration of the left hemi thorax as compared to the most recent examination. Pleural fluid is now seen capping the left lung apex. The known small cell lung cancer in the left lung is likely unchanged. The right hemi thorax is grossly clear. No pneumothorax.  Osseous structures are unchanged.  IMPRESSION: Interval worsening of left pleural effusion with decreased aeration of the left lung as compared to 12/14/12.   Electronically Signed   By: Rise Mu M.D.   On: 12/15/2012 05:54    EKG:  Normal sinus rhythm, lateral ST depressions, downsloping  ASSESSMENT: Mild LV systolic dysfunction, pericardial effusion  PLAN:  I personally reviewed the echocardiogram. It is difficult to evaluate for wall motion abnormalities on the study. In some views, LV ejection fraction appears less than 50%. In other views it is in the 50-55% range. I think 50-55% is a reasonable assessment.   The pericardial fluid is mostly posterior. Particularly, in the subcostal view, there is no anterior fluid. If the fluid progressed and the patient had tamponade, the fluid would have to be removed with  a pericardial window, if the location of the fluid remain the same. I spoke with Dr. Shirline Frees. He felt that the pericardial effusion would likely resolve with chemotherapy if it is malignant. It seems most likely that the etiology of the effusion is the cancer.  The patient received Lasix and had a brisk diuresis as well. This may help clear some of the pulmonary vascular congestion and help her breathing.  Continue to watch electrolytes.  Corky Crafts., MD  12/15/2012  2:16 PM

## 2012-12-15 NOTE — Progress Notes (Signed)
Patient's BS rechecked at 2030 and it was 373. Doctor was notified and new orders were given to give novolog 5 units. Will continue to monitor patient.

## 2012-12-15 NOTE — Progress Notes (Signed)
[facility] PROGRESS NOTE  Misty Ball QMV:[id number] DOB: 06-10-1931 DOA: 12/14/2012 PCP: [name], MD  Assessment/Plan: #1 acute respiratory distress Likely multifactorial secondary to acute CHF exacerbation, large left pleural effusion, probable pericardial effusion. Doubt if patient is seen COPD exacerbation that she's not wheezing and ABG was essentially normal. Patient states she's feeling clinically better. Patient has diuresed -3.18 L over the past 24 hours. Repeat chest x-ray showed worsening left pleural effusion. 2-D echo shows a wall motion abnormality as well as a pericardial effusion. Continue IV Lasix. Strict is and os. Daily weights. We'll consult with pulmonary and cardiology for further evaluation and management.  #2 large left pleural effusion Questionable etiology. Concern for a malignant pleural effusion as patient does have a history of extensive lung cancer being followed by oncology. Will consult with pulmonary for further evaluation and management. Likely needs both diagnostic and therapeutic thoracenthesis.  #3 pericardial effusion Questionable etiology. Concern for malignant effusion versus chemotherapy-induced effusion. Cardiology consultation pending.  #4 acute diastolic CHF exacerbation Questionable etiology. Cardiac enzymes negative x3. 2-D echo with EF of 50-55% with hypokinesis of the inferior-posterior walls, and a large pericardial effusion noted. I/O. equal -3.18 L. BNP has increased to 9868. Continue diuresis with IV Lasix, aspirin, lisinopril, Lopressor. Will consult with cardiology for further evaluation and management.  #5 progressive small cell lung cancer Oncology followed.  #6 hypokalemia Repleted.  #7 hyponatremia Likely secondary to hypervolemic hyponatremia secondary to CHF exacerbation. Continue IV Lasix.  #8 diabetes mellitus Hemoglobin A1c 7.7. CBGs have ranged from 205- 371. Increase Lantus to 45 units daily. 10 sliding  scale insulin to resistant scale.  #9 COPD Stable. Continue nebulizers.  #10 hypertension Stable. Continue low-dose ACE inhibitor and Lopressor.  #11 prophylaxis PPI for GI prophylaxis. Lovenox for DVT prophylaxis.  Code Status: Full Family Communication: Updated patient no family at bedside. Disposition Plan: Home when medically stable.   Consultants:  Cardiology pending  Pulmonary: Dr. Shan Levans 12/15/2012 pending  Procedures:  Chest x-ray 12/14/2012, 12/15/2012  2-D echo 12/14/2012  Antibiotics:  None  HPI/Subjective: Patient states SOB improved. No CP.  Objective: Filed Vitals:   12/15/12 0545  BP: 117/67  Pulse: 77  Temp: 97.5 F (36.4 C)  Resp: 20    Intake/Output Summary (Last 24 hours) at 12/15/12 1034 Last data filed at 12/15/12 0900  Gross per 24 hour  Intake    960 ml  Output   3900 ml  Net  -2940 ml   Filed Weights   12/14/12 1700 12/15/12 0545  Weight: 69.854 kg (154 lb) 65.9 kg (145 lb 4.5 oz)    Exam:   General: NAD  Cardiovascular: RRR  Respiratory: Decreased breath sounds in the bases left greater than right. No wheezing.  Abdomen: Soft, nontender, nondistended, positive bowel sounds.  Musculoskeletal: No clubbing cyanosis. Trace edema.  Data Reviewed: Basic Metabolic Panel:  Recent Labs Lab 12/11/12 0918 12/14/12 0859 12/14/12 1510 12/15/12 0134  NA 137 130*  --  129*  K 3.9 3.4*  --  4.0  CL  --  95*  --  92*  CO2 26 25  --  28  GLUCOSE 225* 222*  --  341*  BUN 15.9 16  --  22  CREATININE 0.8 0.51 0.58 0.75  CALCIUM 9.5 9.0  --  9.3  MG  --   --  1.9  --    Liver Function Tests:  Recent Labs Lab 12/11/12 0918 12/14/12 0859  AST 15 13  ALT 9  8  ALKPHOS 92 90  BILITOT 0.38 0.4  PROT 6.8 6.8  ALBUMIN 3.1* 3.3*   No results found for this basename: LIPASE, AMYLASE,  in the last 168 hours No results found for this basename: AMMONIA,  in the last 168 hours CBC:  Recent Labs Lab 12/11/12 0917  12/14/12 0859 12/14/12 1510 12/15/12 0134  WBC 5.6 6.1 5.9 6.9  NEUTROABS 4.0 4.6  --   --   HGB 13.2 13.1 13.9 12.9  HCT 40.6 39.0 41.6 39.0  MCV 90.3 88.8 87.6 87.4  PLT 240 229 261 272   Cardiac Enzymes:  Recent Labs Lab 12/14/12 1510 12/14/12 1946 12/15/12 0134  TROPONINI <0.30 <0.30 <0.30   BNP (last 3 results)  Recent Labs  12/14/12 0859 12/15/12 0134  PROBNP 3571.0* 9868.0*   CBG:  Recent Labs Lab 12/14/12 1733 12/14/12 1832 12/14/12 2042 12/15/12 0006 12/15/12 0809  GLUCAP 429* 413* 371* 334* 205*    Recent Results (from the past 240 hour(s))  CULTURE, EXPECTORATED SPUTUM-ASSESSMENT     Status: None   Collection Time    12/14/12  2:18 PM      Result Value Range Status   Specimen Description SPUTUM   Final   Special Requests NONE   Final   Sputum evaluation     Final   Value: THIS SPECIMEN IS ACCEPTABLE. RESPIRATORY CULTURE REPORT TO FOLLOW.   Report Status 12/14/2012 FINAL   Final  CULTURE, RESPIRATORY (NON-EXPECTORATED)     Status: None   Collection Time    12/14/12  2:18 PM      Result Value Range Status   Specimen Description SPUTUM   Final   Special Requests NONE   Final   Gram Stain     Final   Value: MODERATE WBC PRESENT,BOTH PMN AND MONONUCLEAR     MODERATE SQUAMOUS EPITHELIAL CELLS PRESENT     FEW GRAM POSITIVE COCCI     IN PAIRS     Performed at [facility]   Culture PENDING   Incomplete   Report Status PENDING   Incomplete     Studies: Dg Chest 2 View  12/14/2012   CLINICAL DATA:  Shortness of breath, cough, chest tightness  EXAM: CHEST  2 VIEW  COMPARISON:  The CT chest of 12/11/2012 and chest x-ray of 05/17/2012  FINDINGS: As noted on recent CT, much of the opacity throughout the left hemi thorax is due to left effusion as well as known small cell carcinoma in the left lung. There is more opacity at the right lung base, and the considerations are that of superimposed pneumonia or edema. Cardiomegaly is stable. A  right-sided palpable Port-A-Cath is present. Mild cardiomegaly is stable. [name]  IMPRESSION: 1. Volume loss throughout the left hemi thorax due to known left effusion and left lung carcinoma. 2. More opacity in the right mid lung and lung base may be due to pneumonia or edema. 3. Cardiomegaly.   Electronically Signed   By: Dwyane Dee M.D.   On: 12/14/2012 09:13   Dg Chest Port 1 View  12/15/2012   CLINICAL DATA:  Shortness of breath, effusion  EXAM: PORTABLE CHEST - 1 VIEW  COMPARISON:  Prior radiograph from 12/14/2012  FINDINGS: Right-sided Port-A-Cath is stable in position. Cardiomegaly is unchanged.  There has been interval worsening of a left-sided effusion with decreased aeration of the left hemi thorax as compared to the most recent examination. Pleural fluid is now seen capping the left lung apex. The known  small cell lung cancer in the left lung is likely unchanged. The right hemi thorax is grossly clear. No pneumothorax.  Osseous structures are unchanged.  IMPRESSION: Interval worsening of left pleural effusion with decreased aeration of the left lung as compared to 12/14/12.   Electronically Signed   By: Rise Mu M.D.   On: 12/15/2012 05:54    Scheduled Meds: . aspirin EC  81 mg Oral Daily  . clonazePAM  1 mg Oral QHS  . docusate sodium  100 mg Oral BID  . doxepin  25 mg Oral QHS  . enoxaparin (LOVENOX) injection  40 mg Subcutaneous Q24H  . furosemide  40 mg Intravenous Q12H  . insulin aspart  0-20 Units Subcutaneous TID WC  . insulin glargine  45 Units Subcutaneous QHS  . levothyroxine  112 mcg Oral Q breakfast  . lipase/protease/amylase  1 capsule Oral TID AC  . lisinopril  5 mg Oral Daily  . metoprolol tartrate  12.5 mg Oral Q12H  . sodium chloride  3 mL Intravenous Q12H   Continuous Infusions:   Principal Problem:   Acute respiratory distress Active Problems:   Malignant neoplasm of upper lobe, bronchus or lung   Acute exacerbation of CHF (congestive heart failure)    Pleural effusion on left   Pericardial effusion   DM   HYPERLIPIDEMIA   HYPERTENSION   COPD (chronic obstructive pulmonary disease)   Hypokalemia    Time spent: 30 mins    [facility]  [facility] Pager [phone]. If 7PM-7AM, please contact night-coverage at [email or web address], password [facility] 12/15/2012, 10:34 AM  LOS: 1 day

## 2012-12-15 NOTE — Progress Notes (Signed)
Thorocentiesis performed at bed side. Tolerated well 1100 cc removed from left lung. After procedure bp 146/65 Hr 82, sat 98%.

## 2012-12-16 ENCOUNTER — Inpatient Hospital Stay (HOSPITAL_COMMUNITY): Payer: Medicare Other

## 2012-12-16 DIAGNOSIS — E876 Hypokalemia: Secondary | ICD-10-CM

## 2012-12-16 DIAGNOSIS — I509 Heart failure, unspecified: Secondary | ICD-10-CM

## 2012-12-16 DIAGNOSIS — C3490 Malignant neoplasm of unspecified part of unspecified bronchus or lung: Secondary | ICD-10-CM

## 2012-12-16 DIAGNOSIS — R0602 Shortness of breath: Secondary | ICD-10-CM

## 2012-12-16 LAB — CBC WITH DIFFERENTIAL/PLATELET
Basophils Absolute: 0 10*3/uL (ref 0.0–0.1)
Eosinophils Absolute: 0 10*3/uL (ref 0.0–0.7)
Eosinophils Absolute: 0 10*3/uL (ref 0.0–0.7)
Eosinophils Relative: 0 % (ref 0–5)
Eosinophils Relative: 0 % (ref 0–5)
HCT: 44.2 % (ref 36.0–46.0)
Hemoglobin: 14.4 g/dL (ref 12.0–15.0)
Lymphocytes Relative: 6 % — ABNORMAL LOW (ref 12–46)
Lymphocytes Relative: 10 % — ABNORMAL LOW (ref 12–46)
Lymphs Abs: 0.7 10*3/uL (ref 0.7–4.0)
Lymphs Abs: 1 10*3/uL (ref 0.7–4.0)
MCH: 29.3 pg (ref 26.0–34.0)
MCH: 29.1 pg (ref 26.0–34.0)
MCHC: 33.5 g/dL (ref 30.0–36.0)
MCV: 88 fL (ref 78.0–100.0)
MCV: 87 fL (ref 78.0–100.0)
Monocytes Absolute: 0.5 10*3/uL (ref 0.1–1.0)
Monocytes Relative: 4 % (ref 3–12)
Neutro Abs: 10.4 10*3/uL — ABNORMAL HIGH (ref 1.7–7.7)
Neutrophils Relative %: 85 % — ABNORMAL HIGH (ref 43–77)
Platelets: 258 10*3/uL (ref 150–400)
Platelets: 243 10*3/uL (ref 150–400)
RBC: 4.91 MIL/uL (ref 3.87–5.11)
RBC: 5.08 MIL/uL (ref 3.87–5.11)
WBC: 10.5 10*3/uL (ref 4.0–10.5)

## 2012-12-16 LAB — BASIC METABOLIC PANEL
BUN: 43 mg/dL — ABNORMAL HIGH (ref 6–23)
CO2: 30 mEq/L (ref 19–32)
CO2: 26 mEq/L (ref 19–32)
Calcium: 8.9 mg/dL (ref 8.4–10.5)
GFR calc non Af Amer: 49 mL/min — ABNORMAL LOW (ref >90)
Glucose, Bld: 172 mg/dL — ABNORMAL HIGH (ref 70–99)
Glucose, Bld: 102 mg/dL — ABNORMAL HIGH (ref 70–99)
Potassium: 3.8 mEq/L (ref 3.5–5.1)
Sodium: 131 mEq/L — ABNORMAL LOW (ref 135–145)
Sodium: 129 mEq/L — ABNORMAL LOW (ref 135–145)

## 2012-12-16 LAB — GLUCOSE, CAPILLARY
Glucose-Capillary: 101 mg/dL — ABNORMAL HIGH (ref 70–99)
Glucose-Capillary: 121 mg/dL — ABNORMAL HIGH (ref 70–99)
Glucose-Capillary: 152 mg/dL — ABNORMAL HIGH (ref 70–99)
Glucose-Capillary: 159 mg/dL — ABNORMAL HIGH (ref 70–99)
Glucose-Capillary: 79 mg/dL (ref 70–99)

## 2012-12-16 LAB — HEPATIC FUNCTION PANEL
ALT: 8 U/L (ref 0–35)
Albumin: 3.1 g/dL — ABNORMAL LOW (ref 3.5–5.2)
Alkaline Phosphatase: 98 U/L (ref 39–117)
Indirect Bilirubin: 0.4 mg/dL (ref 0.3–0.9)
Total Bilirubin: 0.5 mg/dL (ref 0.3–1.2)

## 2012-12-16 LAB — LACTATE DEHYDROGENASE: LDH: 446 U/L — ABNORMAL HIGH (ref 94–250)

## 2012-12-16 MED ORDER — VANCOMYCIN HCL 500 MG IV SOLR
500.0000 mg | Freq: Two times a day (BID) | INTRAVENOUS | Status: DC
  Administered 2012-12-17: 500 mg via INTRAVENOUS
  Filled 2012-12-16 (×2): qty 500

## 2012-12-16 MED ORDER — FLEET ENEMA 7-19 GM/118ML RE ENEM
1.0000 | ENEMA | Freq: Once | RECTAL | Status: AC
  Administered 2012-12-16: 20:00:00 via RECTAL
  Filled 2012-12-16: qty 1

## 2012-12-16 MED ORDER — PIPERACILLIN-TAZOBACTAM 3.375 G IVPB
3.3750 g | Freq: Three times a day (TID) | INTRAVENOUS | Status: DC
  Administered 2012-12-16 – 2012-12-17 (×3): 3.375 g via INTRAVENOUS
  Filled 2012-12-16 (×4): qty 50

## 2012-12-16 MED ORDER — VANCOMYCIN HCL IN DEXTROSE 1-5 GM/200ML-% IV SOLN
1000.0000 mg | Freq: Once | INTRAVENOUS | Status: AC
  Administered 2012-12-16: 1000 mg via INTRAVENOUS
  Filled 2012-12-16: qty 200

## 2012-12-16 MED ORDER — SODIUM CHLORIDE 0.9 % IV BOLUS (SEPSIS)
500.0000 mL | Freq: Once | INTRAVENOUS | Status: AC
  Administered 2012-12-16: 500 mL via INTRAVENOUS

## 2012-12-16 NOTE — Progress Notes (Signed)
Client vomited bright green and dark green emesis without any signs of food particles x2. She is shaking and complains of feeling cold. VSS (see flowsheet). Zofran given as per MAR. Daphane Shepherd notified. No new orders at this time.

## 2012-12-16 NOTE — Progress Notes (Signed)
sats now 95 pn 6l/m - Misty Ball on call and has been updated on events.  No IVF orders at this time will continue to monitor.

## 2012-12-16 NOTE — Progress Notes (Signed)
Subjective: Pt appears comfortable with no increased wob.  Objective: Vital signs in last 24 hours: Blood pressure 137/62, pulse 97, temperature 98.2 F (36.8 C), temperature source Oral, resp. rate 24, height 5\' 4"  (1.626 m), weight 63.5 kg (139 lb 15.9 oz), SpO2 98.00%.  Intake/Output from previous day: 11/07 0701 - 11/08 0700 In: 700 [P.O.:700] Out: 2950 [Urine:2950]   Physical Exam:   wd female in nad Nose without purulence or d/c noted. Neck without tmg or LN Chest with rhonchi, crackles left base, no wheezing Cor with ?irreg LE with minimal edema, no cyanosis Alert, oriented, moves all 4.    Lab Results:  Recent Labs  12/14/12 1510 12/15/12 0134 12/16/12 0530  WBC 5.9 6.9 11.6*  HGB 13.9 12.9 14.4  HCT 41.6 39.0 43.2  PLT 261 272 258   BMET  Recent Labs  12/14/12 0859 12/14/12 1510 12/15/12 0134 12/16/12 0530  NA 130*  --  129* 131*  K 3.4*  --  4.0 3.8  CL 95*  --  92* 90*  CO2 25  --  28 30  GLUCOSE 222*  --  341* 172*  BUN 16  --  22 27*  CREATININE 0.51 0.58 0.75 0.83  CALCIUM 9.0  --  9.3 9.4    Studies/Results: Dg Chest Port 1 View  12/16/2012   CLINICAL DATA:  Cough, shortness of breath, follow-up pleural effusion  EXAM: PORTABLE CHEST - 1 VIEW  COMPARISON:  12/15/2012  FINDINGS: Patchy left upper lobe opacity, suspicious for pneumonia. Additional patchy bilateral lower lobe opacities, atelectasis versus pneumonia.  Left pleural effusion is not well visualized on the current study, likely improved. No pneumothorax.  Cardiomegaly.  Stable right chest port.  IMPRESSION: Patchy left upper lobe opacity, suspicious for pneumonia.  Additional patchy bilateral lower lobe opacities, atelectasis versus pneumonia.  Left pleural effusion is not well visualized and likely improved.   Electronically Signed   By: Charline Bills M.D.   On: 12/16/2012 10:49   Dg Chest Port 1 View  12/15/2012   CLINICAL DATA:  Status post left thoracentesis.  EXAM: PORTABLE  CHEST - 1 VIEW  COMPARISON:  Multiple priors  FINDINGS: Right Port-A-Cath is unchanged in position. Cardiomediastinal silhouette is unchanged. Interval decrease in left pleural effusion following thoracentesis. Opacity is again identified at the left lung apex and left base. Mild right basilar opacity is noted. There is prominence of the interstitial markings.  IMPRESSION: 1. Improvement in left lung aeration following thoracentesis with residual left basilar opacity. No pneumothorax.  2.  Mild right basilar opacity, likely atelectasis.  3.  Continued left upper lobe opacity.  4.  Pulmonary vascular congestion.   Electronically Signed   By: Jerene Dilling M.D.   On: 12/15/2012 12:01   Dg Chest Port 1 View  12/15/2012   CLINICAL DATA:  Shortness of breath, effusion  EXAM: PORTABLE CHEST - 1 VIEW  COMPARISON:  Prior radiograph from 12/14/2012  FINDINGS: Right-sided Port-A-Cath is stable in position. Cardiomegaly is unchanged.  There has been interval worsening of a left-sided effusion with decreased aeration of the left hemi thorax as compared to the most recent examination. Pleural fluid is now seen capping the left lung apex. The known small cell lung cancer in the left lung is likely unchanged. The right hemi thorax is grossly clear. No pneumothorax.  Osseous structures are unchanged.  IMPRESSION: Interval worsening of left pleural effusion with decreased aeration of the left lung as compared to 12/14/12.   Electronically Signed  By: Rise Mu M.D.   On: 12/15/2012 05:54    Assessment/Plan:  1) left pleural effusion unknown origin.  Unclear whether related to small cell lung ca vs heart failure.  S/p thoracentesis -transudate by chemistries, but high lymphocyte predominance may indicate +cancer (small cells are often confused for lymphocytes in pleural fluid).  Need to f/u formal cytology on fluid.  Will check again on Monday, please call if issues arise during the weekend.   Barbaraann Share, M.D. 12/16/2012, 12:44 PM

## 2012-12-16 NOTE — Progress Notes (Signed)
2045  Vanco and zosyn started, fleets given.  Large amount of formed/soft brown stool followed by a large amount of liquid brown stool (approx 2000 cc's).  Pt no longer nauseated but continues to report pain in both lower quadrants upon palpation.  Abd is soft.  sbp is now 99.  sats on 6l/m remian 88-92%. Temp remains 98.4.  Rr18.  Will continue to monitor.

## 2012-12-16 NOTE — Progress Notes (Signed)
Pt. Was lethargic  And had vomited, bp 81/48 hr 100 r 22 sat 93 on 6L Dr. Janee Morn in to see. Fluid bolus  Of 1000cc given per order.

## 2012-12-16 NOTE — Progress Notes (Signed)
Was called per nursing patient with emesis and lethargic. Went to assess the patient patient is drowsy however is answering questions. Patient denies any shortness of breath. Patient denies any chest pain. Patient does endorse emesis.   Vitals blood pressure 81/48  P = 100  RR = 22 sAO2 93% 6L Physical exam: General: Drowsy however opens eyes and answers questions to verbal stimuli Respiratory: Lungs are clear to auscultation in the anterior lung fields Cardiovascular: Regular rate rhythm. Abdomen: Mild distention, positive bowel sounds, tenderness to palpation in the mid to lower abdominal region.  Extremities: No clubbing cyanosis or edema  Assessment/plan #1 acute encephalopathy/altered mental status Likely secondary to hypotension. Patient's systolic blood pressure noted to be in the 80s. Hypotension likely secondary to diuretics versus infectious etiology. We'll give a bolus of normal saline 500 cc x1. Will hold Lasix. Check a chest x-ray, check an abdominal x-ray. Check a UA with cultures and sensitivities. Follow.  #2 hypotension Likely secondary to volume depletion from diuresis. We'll give a bolus of normal saline 500 cc x1. Check an acute abdominal series. Check a UA with cultures and sensitivities. Will follow.  #3 abdominal pain/nausea/emesis Patient with abdominal pain and was noted to have emesis. Will check a KUB to rule out ileus versus small bowel obstruction. We'll make patient n.p.o. for now. Follow.

## 2012-12-16 NOTE — Progress Notes (Signed)
ANTIBIOTIC CONSULT NOTE - INITIAL  Pharmacy Consult for Vancomycin, Zosyn Indication: PNA  Allergies  Allergen Reactions  . Sulfa Antibiotics Rash    Patient Measurements: Height: 5\' 4"  (162.6 cm) Weight: 139 lb 15.9 oz (63.5 kg) IBW/kg (Calculated) : 54.7  Vital Signs: Temp: 98 F (36.7 C) (11/08 1814) Temp src: Oral (11/08 1814) BP: 107/48 mmHg (11/08 1831) Pulse Rate: 101 (11/08 1831) Intake/Output from previous day: 11/07 0701 - 11/08 0700 In: 700 [P.O.:700] Out: 2950 [Urine:2950] Intake/Output from this shift: Total I/O In: 860 [P.O.:360; I.V.:500] Out: 300 [Urine:300]  Labs:  Recent Labs  12/14/12 1510 12/15/12 0134 12/16/12 0530  WBC 5.9 6.9 11.6*  HGB 13.9 12.9 14.4  PLT 261 272 258  CREATININE 0.58 0.75 0.83   Estimated Creatinine Clearance: 47.5 ml/min (by C-G formula based on Cr of 0.83). No results found for this basename: VANCOTROUGH, Leodis Binet, VANCORANDOM, GENTTROUGH, GENTPEAK, GENTRANDOM, TOBRATROUGH, TOBRAPEAK, TOBRARND, AMIKACINPEAK, AMIKACINTROU, AMIKACIN,  in the last 72 hours   Microbiology: Recent Results (from the past 720 hour(s))  CULTURE, EXPECTORATED SPUTUM-ASSESSMENT     Status: None   Collection Time    12/14/12  2:18 PM      Result Value Range Status   Specimen Description SPUTUM   Final   Special Requests NONE   Final   Sputum evaluation     Final   Value: THIS SPECIMEN IS ACCEPTABLE. RESPIRATORY CULTURE REPORT TO FOLLOW.   Report Status 12/14/2012 FINAL   Final  CULTURE, RESPIRATORY (NON-EXPECTORATED)     Status: None   Collection Time    12/14/12  2:18 PM      Result Value Range Status   Specimen Description SPUTUM   Final   Special Requests NONE   Final   Gram Stain     Final   Value: MODERATE WBC PRESENT,BOTH PMN AND MONONUCLEAR     MODERATE SQUAMOUS EPITHELIAL CELLS PRESENT     FEW GRAM POSITIVE COCCI     IN PAIRS     Performed at [facility]   Culture     Final   Value: Culture reincubated for better  growth     Performed at [facility]   Report Status PENDING   Incomplete  URINE CULTURE     Status: None   Collection Time    12/14/12  2:58 PM      Result Value Range Status   Specimen Description URINE, RANDOM   Final   Special Requests NONE   Final   Culture  Setup Time     Final   Value: 12/14/2012 20:46     Performed at [facility] Count     Final   Value: NO GROWTH     Performed at [facility]   Culture     Final   Value: NO GROWTH     Performed at [facility]   Report Status 12/15/2012 FINAL   Final  BODY FLUID CULTURE     Status: None   Collection Time    12/15/12 11:35 AM      Result Value Range Status   Specimen Description PLEURAL   Final   Special Requests Normal   Final   Gram Stain     Final   Value: NO WBC SEEN     NO ORGANISMS SEEN     Performed at [facility]   Culture     Final   Value: NO GROWTH 1 DAY  Performed at [facility]   Report Status PENDING   Incomplete    Medical History: Past Medical History  Diagnosis Date  . Hypertension   . Pancreatitis   . Diabetes mellitus   . Thyroid disease   . lung ca dx'd 06/2012  . History of radiation therapy 07/20/2012-08/14/2012    37.5 Gy to left lung  . History of chemotherapy 07/12/2012    carboplatin and etoposide  . Shortness of breath    Assessment: 38 yoF with PMHx HTN, DM, hx pancreatitis, hypothyroidism, COPD, CHF (EF 50-55%) and recent diagnosis of SCLCa s/p radiation and 5 cycles carboplatin/etoposide, planned chemotherapy cisplatin/irinotecan 11/10 for disease progression, [facility] with SOB, pleural effusion and moderate pericardial effusion.  Pt s/p thoracentesis 11/7 with 1700cc fluid removed. Today pt with emesis and lethary and pharmacy consulted to dose vancomycin and zosyn for possible PNA.  Allergy to sulfa antibiotics noted.   Antibiotics 11/6 >> azithromycin x 1 11/6 >> ceftriaxone x 1 11/8 >> zosyn >> 11/8 >>  vancomycin >>    Tmax: 98.2 WBCs: Increased to 11.6 (last steroid 11/6) Renal: SCr 0.83, CrCl ~48 ml/min (N=62)  Microbiology: 11/8 blood: ordered 11/7 pleural fluid: NGTD 11/6 Sputum: few GPC in pairs 11/6 urine:  NGF  Goal of Therapy:  Vancomycin trough level 15-20 mcg/ml  Plan:  Zosyn 3.375g IV q8h (infuse over 4 hours) Vancomycin 1000mg  x 1, then 500mg  IV q 12 hours F/u renal fxn, WBC, microbiology, vancomycin trough at [facility], clinical course  Haynes Hoehn, PharmD 12/16/2012, 7:18 PM  Pager: [phone]

## 2012-12-16 NOTE — Progress Notes (Signed)
3 more large liquid brown stools

## 2012-12-16 NOTE — Progress Notes (Signed)
[facility] PROGRESS NOTE  Misty Ball ZOX:[id number] DOB: Mar 10, 1931 DOA: 12/14/2012 PCP: [name], MD  Assessment/Plan: #1 acute respiratory distress Likely multifactorial secondary to acute CHF exacerbation, large left pleural effusion, probable pericardial effusion. Doubt if patient if patient has a COPD exacerbation that she's not wheezing and ABG was essentially normal. Patient states she's feeling clinically better. Patient has diuresed -2.250L over the past 24 hours. Repeat chest x-ray showed worsening left pleural effusion. Patient is status post thoracentesis. Repeat chest x-ray pending. 2-D echo shows a wall motion abnormality as well as a pericardial effusion. Continue IV Lasix. Strict is and os. Daily weights. Pulmonary and cardiology following.   #2 large left pleural effusion Questionable etiology. Status post thoracentesis. Concern for a malignant pleural effusion as patient does have a history of extensive lung cancer being followed by oncology. Chemistries consistent with a transudate. Cytology is pending. Pulmonary is following and appreciate input and recommendations.  #3 pericardial effusion Questionable etiology. Concern for malignant effusion versus chemotherapy-induced effusion. Cardiology ff  #4 acute diastolic CHF exacerbation Questionable etiology. Cardiac enzymes negative x3. 2-D echo with EF of 50-55% with hypokinesis of the inferior-posterior walls, and a large pericardial effusion noted. I/O. equal -2.25 L. BNP had increased to 9868 yesterday. Continue diuresis with IV Lasix, aspirin, lisinopril, Lopressor. Cardiology following and appreciate input and recommendations.   #5 progressive small cell lung cancer Oncology followed.  #6 hypokalemia Repleted.  #7 hyponatremia Likely secondary to hypervolemic hyponatremia secondary to CHF exacerbation. Improving. Continue IV Lasix.  #8 diabetes mellitus Hemoglobin A1c 7.7. CBGs have ranged from 79-  159. Continue Lantus to 45 units daily. sliding scale insulin to resistant scale.  #9 COPD Stable. Continue nebulizers.  #10 hypertension Stable. Continue low-dose ACE inhibitor and Lopressor.  #11 prophylaxis PPI for GI prophylaxis. Lovenox for DVT prophylaxis.  Code Status: Full Family Communication: Updated patient no family at bedside. Disposition Plan: Home when medically stable.   Consultants:  Cardiology : Dr. Eldridge Dace 12/15/2012  Pulmonary: Dr. Shan Levans 12/15/2012 pending  Procedures:  Chest x-ray 12/14/2012, 12/15/2012  2-D echo 12/14/2012  Thoracentesis 12/15/2012 Dr. Delford Field  Antibiotics:  None  HPI/Subjective: Patient states SOB improved. No CP.  Objective: Filed Vitals:   12/16/12 0401  BP: 137/62  Pulse: 97  Temp: 98.2 F (36.8 C)  Resp: 24    Intake/Output Summary (Last 24 hours) at 12/16/12 0942 Last data filed at 12/16/12 0401  Gross per 24 hour  Intake    460 ml  Output   2950 ml  Net  -2490 ml   Filed Weights   12/14/12 1700 12/15/12 0545 12/16/12 0401  Weight: 69.854 kg (154 lb) 65.9 kg (145 lb 4.5 oz) 63.5 kg (139 lb 15.9 oz)    Exam:   General: NAD  Cardiovascular: RRR  Respiratory: Decreased breath sounds in the bases left greater than right. No wheezing.  Abdomen: Soft, nontender, nondistended, positive bowel sounds.  Musculoskeletal: No clubbing cyanosis. Trace edema.  Data Reviewed: Basic Metabolic Panel:  Recent Labs Lab 12/11/12 0918 12/14/12 0859 12/14/12 1510 12/15/12 0134 12/16/12 0530  NA 137 130*  --  129* 131*  K 3.9 3.4*  --  4.0 3.8  CL  --  95*  --  92* 90*  CO2 26 25  --  28 30  GLUCOSE 225* 222*  --  341* 172*  BUN 15.9 16  --  22 27*  CREATININE 0.8 0.51 0.58 0.75 0.83  CALCIUM 9.5 9.0  --  9.3 9.4  MG  --   --  1.9  --   --    Liver Function Tests:  Recent Labs Lab 12/11/12 0918 12/14/12 0859 12/15/12 0134 12/16/12 0530  AST 15 13  --  17  ALT 9 8  --  8  ALKPHOS 92 90   --  98  BILITOT 0.38 0.4  --  0.5  PROT 6.8 6.8 6.7 6.8  ALBUMIN 3.1* 3.3*  --  3.1*   No results found for this basename: LIPASE, AMYLASE,  in the last 168 hours No results found for this basename: AMMONIA,  in the last 168 hours CBC:  Recent Labs Lab 12/11/12 0917 12/14/12 0859 12/14/12 1510 12/15/12 0134 12/16/12 0530  WBC 5.6 6.1 5.9 6.9 11.6*  NEUTROABS 4.0 4.6  --   --  10.4*  HGB 13.2 13.1 13.9 12.9 14.4  HCT 40.6 39.0 41.6 39.0 43.2  MCV 90.3 88.8 87.6 87.4 88.0  PLT 240 229 261 272 258   Cardiac Enzymes:  Recent Labs Lab 12/14/12 1510 12/14/12 1946 12/15/12 0134  TROPONINI <0.30 <0.30 <0.30   BNP (last 3 results)  Recent Labs  12/14/12 0859 12/15/12 0134  PROBNP 3571.0* 9868.0*   CBG:  Recent Labs Lab 12/15/12 0809 12/15/12 1203 12/15/12 1718 12/15/12 2146 12/16/12 0738  GLUCAP 205* 274* 164* 287* 159*    Recent Results (from the past 240 hour(s))  CULTURE, EXPECTORATED SPUTUM-ASSESSMENT     Status: None   Collection Time    12/14/12  2:18 PM      Result Value Range Status   Specimen Description SPUTUM   Final   Special Requests NONE   Final   Sputum evaluation     Final   Value: THIS SPECIMEN IS ACCEPTABLE. RESPIRATORY CULTURE REPORT TO FOLLOW.   Report Status 12/14/2012 FINAL   Final  CULTURE, RESPIRATORY (NON-EXPECTORATED)     Status: None   Collection Time    12/14/12  2:18 PM      Result Value Range Status   Specimen Description SPUTUM   Final   Special Requests NONE   Final   Gram Stain     Final   Value: MODERATE WBC PRESENT,BOTH PMN AND MONONUCLEAR     MODERATE SQUAMOUS EPITHELIAL CELLS PRESENT     FEW GRAM POSITIVE COCCI     IN PAIRS     Performed at [facility]   Culture PENDING   Incomplete   Report Status PENDING   Incomplete  URINE CULTURE     Status: None   Collection Time    12/14/12  2:58 PM      Result Value Range Status   Specimen Description URINE, RANDOM   Final   Special Requests NONE   Final    Culture  Setup Time     Final   Value: 12/14/2012 20:46     Performed at [facility] Count     Final   Value: NO GROWTH     Performed at [facility]   Culture     Final   Value: NO GROWTH     Performed at [facility]   Report Status 12/15/2012 FINAL   Final  BODY FLUID CULTURE     Status: None   Collection Time    12/15/12 11:35 AM      Result Value Range Status   Specimen Description PLEURAL   Final   Special Requests Normal   Final   Gram  Stain     Final   Value: NO WBC SEEN     NO ORGANISMS SEEN     Performed at [facility]   Culture     Final   Value: NO GROWTH 1 DAY     Performed at [facility]   Report Status PENDING   Incomplete     Studies: Dg Chest Port 1 View  12/15/2012   CLINICAL DATA:  Status post left thoracentesis.  EXAM: PORTABLE CHEST - 1 VIEW  COMPARISON:  Multiple priors  FINDINGS: Right Port-A-Cath is unchanged in position. Cardiomediastinal silhouette is unchanged. Interval decrease in left pleural effusion following thoracentesis. Opacity is again identified at the left lung apex and left base. Mild right basilar opacity is noted. There is prominence of the interstitial markings.  IMPRESSION: 1. Improvement in left lung aeration following thoracentesis with residual left basilar opacity. No pneumothorax.  2.  Mild right basilar opacity, likely atelectasis.  3.  Continued left upper lobe opacity.  4.  Pulmonary vascular congestion.   Electronically Signed   By: Jerene Dilling M.D.   On: 12/15/2012 12:01   Dg Chest Port 1 View  12/15/2012   CLINICAL DATA:  Shortness of breath, effusion  EXAM: PORTABLE CHEST - 1 VIEW  COMPARISON:  Prior radiograph from 12/14/2012  FINDINGS: Right-sided Port-A-Cath is stable in position. Cardiomegaly is unchanged.  There has been interval worsening of a left-sided effusion with decreased aeration of the left hemi thorax as compared to the most recent examination. Pleural fluid  is now seen capping the left lung apex. The known small cell lung cancer in the left lung is likely unchanged. The right hemi thorax is grossly clear. No pneumothorax.  Osseous structures are unchanged.  IMPRESSION: Interval worsening of left pleural effusion with decreased aeration of the left lung as compared to 12/14/12.   Electronically Signed   By: Rise Mu M.D.   On: 12/15/2012 05:54    Scheduled Meds: . aspirin EC  81 mg Oral Daily  . clonazePAM  1 mg Oral QHS  . docusate sodium  100 mg Oral BID  . doxepin  25 mg Oral QHS  . enoxaparin (LOVENOX) injection  40 mg Subcutaneous Q24H  . furosemide  40 mg Intravenous Q12H  . insulin aspart  0-20 Units Subcutaneous TID WC  . insulin glargine  45 Units Subcutaneous QHS  . levothyroxine  112 mcg Oral Q breakfast  . lipase/protease/amylase  1 capsule Oral TID AC  . lisinopril  5 mg Oral Daily  . metoprolol tartrate  12.5 mg Oral Q12H  . sodium chloride  3 mL Intravenous Q12H   Continuous Infusions:   Principal Problem:   Acute respiratory distress Active Problems:   Malignant neoplasm of upper lobe, bronchus or lung   Acute exacerbation of CHF (congestive heart failure)   Pleural effusion on left   Pericardial effusion   DM   HYPERLIPIDEMIA   HYPERTENSION   COPD (chronic obstructive pulmonary disease)   Hypokalemia    Time spent: 30 mins    [facility]  [facility] Pager [phone]. If 7PM-7AM, please contact night-coverage at [email or web address], password [facility] 12/16/2012, 9:42 AM  LOS: 2 days

## 2012-12-16 NOTE — Progress Notes (Signed)
Patient Name: Misty Ball Date of Encounter: 12/16/2012  Principal Problem:   Acute respiratory distress Active Problems:   DM   HYPERLIPIDEMIA   HYPERTENSION   Malignant neoplasm of upper lobe, bronchus or lung   COPD (chronic obstructive pulmonary disease)   Acute exacerbation of CHF (congestive heart failure)   Hypokalemia   Pleural effusion on left   Pericardial effusion   Length of Stay: 63  HPI: 79 year-old and with small cell lung cancer. She was admitted with shortness of breath. Workup showed a large left pleural effusion and a pericardial effusion. The pericardial effusion was not thought to be causing tamponade. She had a therapeutic thoracentesis in which 1700 cc of fluid were removed. Her breathing has improved. Pulmonary vascular congestion was still noted. Echocardiogram revealed an ejection fraction 50-55% with inferior posterior hypokinesis. The patient denies any chest pain. Her main issue his breathing. She does not typically use oxygen at home, but is currently on 4 L nasal cannula. She has never had any prior cardiac workup. She does not recall ever having a stress test.  SUBJECTIVE  Improved SOB.  CURRENT MEDS . aspirin EC  81 mg Oral Daily  . clonazePAM  1 mg Oral QHS  . docusate sodium  100 mg Oral BID  . doxepin  25 mg Oral QHS  . enoxaparin (LOVENOX) injection  40 mg Subcutaneous Q24H  . furosemide  40 mg Intravenous Q12H  . insulin aspart  0-20 Units Subcutaneous TID WC  . insulin glargine  45 Units Subcutaneous QHS  . levothyroxine  112 mcg Oral Q breakfast  . lipase/protease/amylase  1 capsule Oral TID AC  . lisinopril  5 mg Oral Daily  . metoprolol tartrate  12.5 mg Oral Q12H  . sodium chloride  3 mL Intravenous Q12H    OBJECTIVE  Filed Vitals:   12/15/12 0545 12/15/12 1351 12/15/12 2142 12/16/12 0401  BP: 117/67 165/68 140/60 137/62  Pulse: 77 80 85 97  Temp: 97.5 F (36.4 C) 97.7 F (36.5 C) 98.2 F (36.8 C) 98.2 F (36.8 C)    TempSrc: Oral Oral Oral Oral  Resp: 20 22  24   Height:      Weight: 145 lb 4.5 oz (65.9 kg)   139 lb 15.9 oz (63.5 kg)  SpO2: 100% 96% 98% 98%    Intake/Output Summary (Last 24 hours) at 12/16/12 0826 Last data filed at 12/16/12 0401  Gross per 24 hour  Intake    700 ml  Output   2950 ml  Net  -2250 ml   Filed Weights   12/14/12 1700 12/15/12 0545 12/16/12 0401  Weight: 154 lb (69.854 kg) 145 lb 4.5 oz (65.9 kg) 139 lb 15.9 oz (63.5 kg)    PHYSICAL EXAM  General: Pleasant, NAD. Neuro: Alert and oriented X 3. Moves all extremities spontaneously. Psych: Normal affect. HEENT:  Normal  Neck: Supple without bruits or JVD. Lungs:  Resp regular and unlabored, rales B/L, decreased sounds at the basis Heart: RRR no s3, s4, or murmurs. Abdomen: Soft, non-tender, non-distended, BS + x 4.  Extremities: No clubbing, cyanosis or edema. DP/PT/Radials 2+ and equal bilaterally.  Accessory Clinical Findings  CBC  Recent Labs  12/14/12 0859  12/15/12 0134 12/16/12 0530  WBC 6.1  < > 6.9 11.6*  NEUTROABS 4.6  --   --  10.4*  HGB 13.1  < > 12.9 14.4  HCT 39.0  < > 39.0 43.2  MCV 88.8  < >  87.4 88.0  PLT 229  < > 272 258  < > = values in this interval not displayed. Basic Metabolic Panel  Recent Labs  12/14/12 1510 12/15/12 0134 12/16/12 0530  NA  --  129* 131*  K  --  4.0 3.8  CL  --  92* 90*  CO2  --  28 30  GLUCOSE  --  341* 172*  BUN  --  22 27*  CREATININE 0.58 0.75 0.83  CALCIUM  --  9.3 9.4  MG 1.9  --   --    Liver Function Tests  Recent Labs  12/14/12 0859 12/15/12 0134 12/16/12 0530  AST 13  --  17  ALT 8  --  8  ALKPHOS 90  --  98  BILITOT 0.4  --  0.5  PROT 6.8 6.7 6.8  ALBUMIN 3.3*  --  3.1*   Cardiac Enzymes  Recent Labs  12/14/12 1510 12/14/12 1946 12/15/12 0134  TROPONINI <0.30 <0.30 <0.30   Hemoglobin A1C  Recent Labs  12/14/12 0900  HGBA1C 7.7*   Thyroid Function Tests  Recent Labs  12/14/12 1510  TSH 1.275     Radiology/Studies  Ct Abdomen Pelvis W Contrast  12/11/2012   IMPRESSION: 1. Interval progression of small cell lung cancer, as evidenced by increasing consolidation in the left upper lobe, new moderate left pleural effusion and enlarging bilateral adrenal metastases. 2. Areas of sclerosis in the iliac wings appear stable. 3. Borderline ascending aortic aneurysm. Enlarged pulmonary arteries are indicative of pulmonary arterial hypertension. Extensive coronary artery calcifications. 4. Possible left renal stones. 5. Increase in prominence of mesenteric lymph nodes, not pathologically enlarged by CT size criteria, nonspecific. Continued attention on followup exams is warranted.   Electronically Signed   By: Leanna Battles M.D.   On: 12/11/2012 10:49   Dg Chest Port 1 View  12/15/2012   CLINICAL DATA:  Shortness of breath, effusion  EXAM: PORTABLE CHEST - 1 VIEW  COMPARISON:  Prior radiograph from 12/14/2012  FINDINGS: Right-sided Port-A-Cath is stable in position. Cardiomegaly is unchanged.  There has been interval worsening of a left-sided effusion with decreased aeration of the left hemi thorax as compared to the most recent examination. Pleural fluid is now seen capping the left lung apex. The known small cell lung cancer in the left lung is likely unchanged. The right hemi thorax is grossly clear. No pneumothorax.  Osseous structures are unchanged.  IMPRESSION: Interval worsening of left pleural effusion with decreased aeration of the left lung as compared to 12/14/12.   Electronically Signed   By: Rise Mu M.D.   On: 12/15/2012 05:54    TELE  SR, HR 65-100  ECG  Normal sinus rhythm, lateral ST depressions, downsloping   ASSESSMENT AND PLAN  79 year old female with small cell lung cancer   1. Pericardial effusion - mostly posterior, LV EF 50 %, no signs of tamponade on the echocardiogram and clinically, the effusion is most probably malignant and might improve with  chemotherapy and diuretics  2. Pleural effusion, lund edema - s/p thoracentesis, improving SOB, with significant diuresis with iv Lasix, but still fluid overloaded, we will continue the same regimen, Crea stable   Signed, Tobias Alexander, H MD, [facility] 12/16/2012

## 2012-12-17 ENCOUNTER — Inpatient Hospital Stay (HOSPITAL_COMMUNITY): Payer: Medicare Other

## 2012-12-17 DIAGNOSIS — N179 Acute kidney failure, unspecified: Secondary | ICD-10-CM

## 2012-12-17 DIAGNOSIS — I959 Hypotension, unspecified: Secondary | ICD-10-CM

## 2012-12-17 LAB — GLUCOSE, CAPILLARY: Glucose-Capillary: 165 mg/dL — ABNORMAL HIGH (ref 70–99)

## 2012-12-17 LAB — CBC
Hemoglobin: 14.6 g/dL (ref 12.0–15.0)
MCV: 86.9 fL (ref 78.0–100.0)
RBC: 5.1 MIL/uL (ref 3.87–5.11)
WBC: 8.5 10*3/uL (ref 4.0–10.5)

## 2012-12-17 LAB — BASIC METABOLIC PANEL
CO2: 24 mEq/L (ref 19–32)
GFR calc Af Amer: 24 mL/min — ABNORMAL LOW (ref >90)
GFR calc non Af Amer: 20 mL/min — ABNORMAL LOW (ref >90)
Glucose, Bld: 181 mg/dL — ABNORMAL HIGH (ref 70–99)
Potassium: 4 mEq/L (ref 3.5–5.1)
Sodium: 129 mEq/L — ABNORMAL LOW (ref 135–145)

## 2012-12-17 LAB — URINE MICROSCOPIC-ADD ON

## 2012-12-17 LAB — URINALYSIS, ROUTINE W REFLEX MICROSCOPIC
Glucose, UA: NEGATIVE mg/dL
Hgb urine dipstick: NEGATIVE
Protein, ur: 30 mg/dL — AB
Specific Gravity, Urine: 1.031 — ABNORMAL HIGH (ref 1.005–1.030)
pH: 5 (ref 5.0–8.0)

## 2012-12-17 LAB — CULTURE, RESPIRATORY W GRAM STAIN: Culture: NORMAL

## 2012-12-17 LAB — CREATININE, URINE, RANDOM: Creatinine, Urine: 123.6 mg/dL

## 2012-12-17 LAB — CULTURE, RESPIRATORY

## 2012-12-17 LAB — SODIUM, URINE, RANDOM: Sodium, Ur: 31 mEq/L

## 2012-12-17 MED ORDER — SODIUM CHLORIDE 0.9 % IV SOLN
INTRAVENOUS | Status: DC
  Administered 2012-12-17: 12:00:00 via INTRAVENOUS

## 2012-12-17 MED ORDER — PIPERACILLIN-TAZOBACTAM IN DEX 2-0.25 GM/50ML IV SOLN
2.2500 g | Freq: Four times a day (QID) | INTRAVENOUS | Status: DC
  Administered 2012-12-17 – 2012-12-19 (×7): 2.25 g via INTRAVENOUS
  Filled 2012-12-17 (×9): qty 50

## 2012-12-17 MED ORDER — VANCOMYCIN HCL IN DEXTROSE 1-5 GM/200ML-% IV SOLN
1000.0000 mg | INTRAVENOUS | Status: DC
  Administered 2012-12-19: 08:00:00 1000 mg via INTRAVENOUS
  Filled 2012-12-17: qty 200

## 2012-12-17 MED ORDER — ENOXAPARIN SODIUM 30 MG/0.3ML ~~LOC~~ SOLN
30.0000 mg | SUBCUTANEOUS | Status: DC
  Administered 2012-12-17 – 2012-12-19 (×3): 30 mg via SUBCUTANEOUS
  Filled 2012-12-17 (×4): qty 0.3

## 2012-12-17 NOTE — Progress Notes (Signed)
[facility] PROGRESS NOTE  Misty Ball ZOX:[id number] DOB: 01-05-1932 DOA: 12/14/2012 PCP: [name], MD  Assessment/Plan: #1 acute respiratory distress Likely multifactorial secondary to acute CHF exacerbation, large left pleural effusion, probable pericardial effusion. Doubt if patient if patient has a COPD exacerbation that she's not wheezing and ABG was essentially normal. Patient states she's feeling clinically better.  Repeat chest x-ray showed worsening left pleural effusion. Patient is status post thoracentesis. Repeat chest x-ray concern for multifocal pneumonia. 2-D echo shows a wall motion abnormality as well as a pericardial effusion.Hold Lasix today. Strict Is and Os. Daily weights. Pulmonary and cardiology following.   #2 large left pleural effusion Questionable etiology. Status post thoracentesis. Concern for a malignant pleural effusion as patient does have a history of extensive lung cancer being followed by oncology. Chemistries consistent with a transudate. Cytology is pending. Pulmonary is following and appreciate input and recommendations.  #3 pericardial effusion Questionable etiology. Concern for malignant effusion versus chemotherapy-induced effusion. Repeat 2-D echo done today secondary to hypotension we shows decreased pericardial effusion. No signs of tamponade. Cardiology ff  #4 acute diastolic CHF exacerbation Questionable etiology. Cardiac enzymes negative x3. 2-D echo with EF of 50-55% with hypokinesis of the inferior-posterior walls, and a large pericardial effusion noted. I/O. equal +646cc. BNP had increased to 9868 yesterday. Hold Lasix and ACE inhibitor secondary to worsening renal function. Continue  aspirin,  Lopressor. Cardiology following and appreciate input and recommendations.   #5 probable multifocal pneumonia Patient was noted to have emesis yesterday chest x-ray which was done had persistent patchy airspace disease in the left upper lobe  and bilateral lower lobe suspicious for pneumonia. Blood cultures are pending. Due to patient's immune status will treat broadly with IV vancomycin and IV Zosyn.  #6 hypotension Questionable etiology. Likely secondary to volume depletion in the setting of underlying diuretics.. Patient with some emesis. Blood pressure responded to IV fluids. Lasix will be discontinued. ACE inhibitor discontinued. Continue hydration monitor fluid status. Follow.  #7 acute renal failure Creatinine is elevated at 2.17 today. Likely prerenal azotemia. Check urine electrolytes. Gentle hydration. Discontinue Lasix and ACE inhibitor. Follow.  #8 emesis/abdominal pain Questionable etiology. Abdominal films yesterday were negative. Repeat abdominal films again today. Supportive care, symptomatic treatment.  #9 progressive small cell lung cancer Oncology following.  #10 hypokalemia Repleted.  #11 hyponatremia Likely secondary to hypervolemic hyponatremia secondary to CHF exacerbation. Improving.  #12 diabetes mellitus Hemoglobin A1c 7.7. CBGs have ranged from 131- 154. Continue Lantus to 45 units daily. sliding scale insulin to resistant scale.  #13 COPD Stable. Continue nebulizers.  #14 hypertension Stable. Discontinue ACE inhibitor secondary to elevated renal function. Continue low-dose beta blocker.  #15 prophylaxis PPI for GI prophylaxis. Lovenox for DVT prophylaxis.  Code Status: Full Family Communication: Updated patient no family at bedside. Disposition Plan: Home when medically stable.   Consultants:  Cardiology : Dr. Eldridge Dace 12/15/2012  Pulmonary: Dr. Shan Levans 12/15/2012 pending  Procedures:  Chest x-ray 12/14/2012, 12/15/2012  2-D echo 12/14/2012, 12/17/2012  Thoracentesis 12/15/2012 Dr. Delford Field  Chest x-ray/KUB 12/16/2012  Antibiotics:  None  HPI/Subjective: Patient states SOB improved. No CP. Patient with some emesis overnight yesterday and some emesis today. Patient  received an enema today and per nursing has been having loose stools.  Objective: Filed Vitals:   12/17/12 1600  BP: 105/53  Pulse: 99  Temp: 97.4 F (36.3 C)  Resp:     Intake/Output Summary (Last 24 hours) at 12/17/12 1845 Last data filed at 12/17/12 1800  Gross per 24 hour  Intake    690 ml  Output    219 ml  Net    471 ml   Filed Weights   12/15/12 0545 12/16/12 0401 12/17/12 0300  Weight: 65.9 kg (145 lb 4.5 oz) 63.5 kg (139 lb 15.9 oz) 64 kg (141 lb 1.5 oz)    Exam:   General: NAD  Cardiovascular: RRR  Respiratory: Decreased breath sounds in the bases left greater than right. No wheezing.  Abdomen: Soft, tender to palpation, nondistended, positive bowel sounds.  Musculoskeletal: No clubbing cyanosis. Trace edema.  Data Reviewed: Basic Metabolic Panel:  Recent Labs Lab 12/14/12 0859 12/14/12 1510 12/15/12 0134 12/16/12 0530 12/16/12 1905 12/17/12 0546  NA 130*  --  129* 131* 129* 129*  K 3.4*  --  4.0 3.8 4.3 4.0  CL 95*  --  92* 90* 92* 88*  CO2 25  --  28 30 26 24   GLUCOSE 222*  --  341* 172* 102* 181*  BUN 16  --  22 27* 43* 56*  CREATININE 0.51 0.58 0.75 0.83 1.05 2.17*  CALCIUM 9.0  --  9.3 9.4 8.9 9.1  MG  --  1.9  --   --   --   --    Liver Function Tests:  Recent Labs Lab 12/11/12 0918 12/14/12 0859 12/15/12 0134 12/16/12 0530  AST 15 13  --  17  ALT 9 8  --  8  ALKPHOS 92 90  --  98  BILITOT 0.38 0.4  --  0.5  PROT 6.8 6.8 6.7 6.8  ALBUMIN 3.1* 3.3*  --  3.1*   No results found for this basename: LIPASE, AMYLASE,  in the last 168 hours No results found for this basename: AMMONIA,  in the last 168 hours CBC:  Recent Labs Lab 12/11/12 0917  12/14/12 0859 12/14/12 1510 12/15/12 0134 12/16/12 0530 12/16/12 1905 12/17/12 0546  WBC 5.6  < > 6.1 5.9 6.9 11.6* 10.5 8.5  NEUTROABS 4.0  --  4.6  --   --  10.4* 9.0*  --   HGB 13.2  < > 13.1 13.9 12.9 14.4 14.8 14.6  HCT 40.6  < > 39.0 41.6 39.0 43.2 44.2 44.3  MCV 90.3  < >  88.8 87.6 87.4 88.0 87.0 86.9  PLT 240  < > 229 261 272 258 243 288  < > = values in this interval not displayed. Cardiac Enzymes:  Recent Labs Lab 12/14/12 1510 12/14/12 1946 12/15/12 0134  TROPONINI <0.30 <0.30 <0.30   BNP (last 3 results)  Recent Labs  12/14/12 0859 12/15/12 0134  PROBNP 3571.0* 9868.0*   CBG:  Recent Labs Lab 12/16/12 2343 12/17/12 0356 12/17/12 0742 12/17/12 1206 12/17/12 1644  GLUCAP 152* 174* 165* 154* 131*    Recent Results (from the past 240 hour(s))  CULTURE, EXPECTORATED SPUTUM-ASSESSMENT     Status: None   Collection Time    12/14/12  2:18 PM      Result Value Range Status   Specimen Description SPUTUM   Final   Special Requests NONE   Final   Sputum evaluation     Final   Value: THIS SPECIMEN IS ACCEPTABLE. RESPIRATORY CULTURE REPORT TO FOLLOW.   Report Status 12/14/2012 FINAL   Final  CULTURE, RESPIRATORY (NON-EXPECTORATED)     Status: None   Collection Time    12/14/12  2:18 PM      Result Value Range Status   Specimen Description SPUTUM  Final   Special Requests NONE   Final   Gram Stain     Final   Value: MODERATE WBC PRESENT,BOTH PMN AND MONONUCLEAR     MODERATE SQUAMOUS EPITHELIAL CELLS PRESENT     FEW GRAM POSITIVE COCCI     IN PAIRS     Performed at [facility]   Culture     Final   Value: NORMAL OROPHARYNGEAL FLORA     Performed at [facility]   Report Status 12/17/2012 FINAL   Final  URINE CULTURE     Status: None   Collection Time    12/14/12  2:58 PM      Result Value Range Status   Specimen Description URINE, RANDOM   Final   Special Requests NONE   Final   Culture  Setup Time     Final   Value: 12/14/2012 20:46     Performed at [facility] Count     Final   Value: NO GROWTH     Performed at [facility]   Culture     Final   Value: NO GROWTH     Performed at [facility]   Report Status 12/15/2012 FINAL   Final  BODY FLUID CULTURE     Status: None    Collection Time    12/15/12 11:35 AM      Result Value Range Status   Specimen Description PLEURAL   Final   Special Requests Normal   Final   Gram Stain     Final   Value: NO WBC SEEN     NO ORGANISMS SEEN     Performed at [facility]   Culture     Final   Value: NO GROWTH 2 DAYS     Performed at [facility]   Report Status PENDING   Incomplete     Studies: Dg Abd 1 View  12/17/2012   CLINICAL DATA:  Abdominal pain, nausea and vomiting.  EXAM: ABDOMEN - 1 VIEW  COMPARISON:  12/16/2012.  FINDINGS: Normal bowel gas pattern. Lumbar spine degenerative changes. Atheromatous arterial calcifications. Cholecystectomy clips.  IMPRESSION: No acute abnormality.   Electronically Signed   By: Gordan Payment M.D.   On: 12/17/2012 17:03   Dg Chest Port 1 View  12/16/2012   CLINICAL DATA:  Lethargy  EXAM: PORTABLE CHEST - 1 VIEW  COMPARISON:  11/8/ 14  FINDINGS: Cardiomediastinal silhouette is stable. Right Port-A-Cath is unchanged in position. Persistent patchy airspace disease left upper lobe and bilateral lower lobes suspicious for multifocal pneumonia.  IMPRESSION: Persistent patchy airspace disease left upper lobe and bilateral lower lobe suspicious for pneumonia. Stable right Port-A-Cath position.   Electronically Signed   By: Natasha Mead M.D.   On: 12/16/2012 18:17   Dg Chest Port 1 View  12/16/2012   CLINICAL DATA:  Cough, shortness of breath, follow-up pleural effusion  EXAM: PORTABLE CHEST - 1 VIEW  COMPARISON:  12/15/2012  FINDINGS: Patchy left upper lobe opacity, suspicious for pneumonia. Additional patchy bilateral lower lobe opacities, atelectasis versus pneumonia.  Left pleural effusion is not well visualized on the current study, likely improved. No pneumothorax.  Cardiomegaly.  Stable right chest port.  IMPRESSION: Patchy left upper lobe opacity, suspicious for pneumonia.  Additional patchy bilateral lower lobe opacities, atelectasis versus pneumonia.  Left pleural effusion  is not well visualized and likely improved.   Electronically Signed   By: Roselie Awkward.D.  On: 12/16/2012 10:49   Dg Abd Portable 1v  12/16/2012   CLINICAL DATA:  Abdominal pain and emesis  EXAM: PORTABLE ABDOMEN - 1 VIEW  COMPARISON:  None.  FINDINGS: There are no abnormally dilated loops of bowel. There is stool distending the rectum somewhat. Cholecystectomy clips are noted in the right upper quadrant.  IMPRESSION: Nonobstructive gas pattern. Mild distal fecal retention.   Electronically Signed   By: Esperanza Heir M.D.   On: 12/16/2012 18:19    Scheduled Meds: . aspirin EC  81 mg Oral Daily  . clonazePAM  1 mg Oral QHS  . docusate sodium  100 mg Oral BID  . doxepin  25 mg Oral QHS  . enoxaparin (LOVENOX) injection  30 mg Subcutaneous Q24H  . insulin aspart  0-20 Units Subcutaneous TID WC  . insulin glargine  45 Units Subcutaneous QHS  . levothyroxine  112 mcg Oral Q breakfast  . lipase/protease/amylase  1 capsule Oral TID AC  . metoprolol tartrate  12.5 mg Oral Q12H  . piperacillin-tazobactam (ZOSYN)  IV  2.25 g Intravenous Q6H  . sodium chloride  3 mL Intravenous Q12H  . [START ON 12/19/2012] vancomycin  1,000 mg Intravenous Q48H   Continuous Infusions: . sodium chloride 50 mL/hr at 12/17/12 1155    Principal Problem:   Acute respiratory distress Active Problems:   Malignant neoplasm of upper lobe, bronchus or lung   Acute exacerbation of CHF (congestive heart failure)   Pleural effusion on left   Pericardial effusion   DM   HYPERLIPIDEMIA   HYPERTENSION   COPD (chronic obstructive pulmonary disease)   PNA (pneumonia)   Hypokalemia   Hypotension, unspecified   ARF (acute renal failure)    Time spent: 30 mins    [facility]  [facility] Pager [phone]. If 7PM-7AM, please contact night-coverage at [email or web address], password [facility] 12/17/2012, 6:45 PM  LOS: 3 days

## 2012-12-17 NOTE — Progress Notes (Signed)
In total pt has had 10 stools in the past 8 hours after administering the fleets enema.  In the last 2 stools able to see light pink/red blood mixed in with stool.  UNABLE  to obtain urine samples as stools and urine have been mixed.  BP remains stable at 107/64, sats on 6l/m Silvana range from 88 - 95%, has vommitted x2 small amounts of brown/green liquid this shift.  Pt remains lethargic and weak but improved since beginning of shift.

## 2012-12-17 NOTE — Progress Notes (Addendum)
Patient Name: Misty Ball Date of Encounter: 12/17/2012  Principal Problem:   Acute respiratory distress Active Problems:   DM   HYPERLIPIDEMIA   HYPERTENSION   Malignant neoplasm of upper lobe, bronchus or lung   COPD (chronic obstructive pulmonary disease)   Acute exacerbation of CHF (congestive heart failure)   Hypokalemia   Pleural effusion on left   Pericardial effusion   Length of Stay: 6  HPI: 79 year-old and with small cell lung cancer. She was admitted with shortness of breath. Workup showed a large left pleural effusion and a pericardial effusion. The pericardial effusion was not thought to be causing tamponade. She had a therapeutic thoracentesis in which 1700 cc of fluid were removed. Her breathing has improved. Pulmonary vascular congestion was still noted. Echocardiogram revealed an ejection fraction 50-55% with inferior posterior hypokinesis. The patient denies any chest pain. Her main issue his breathing. She does not typically use oxygen at home, but is currently on 4 L nasal cannula. She has never had any prior cardiac workup. She does not recall ever having a stress test.  SUBJECTIVE  The patient complains of suprapubic pain, no SOB. The patient appears lethargic.  CURRENT MEDS . aspirin EC  81 mg Oral Daily  . clonazePAM  1 mg Oral QHS  . docusate sodium  100 mg Oral BID  . doxepin  25 mg Oral QHS  . enoxaparin (LOVENOX) injection  40 mg Subcutaneous Q24H  . insulin aspart  0-20 Units Subcutaneous TID WC  . insulin glargine  45 Units Subcutaneous QHS  . levothyroxine  112 mcg Oral Q breakfast  . lipase/protease/amylase  1 capsule Oral TID AC  . lisinopril  5 mg Oral Daily  . metoprolol tartrate  12.5 mg Oral Q12H  . piperacillin-tazobactam (ZOSYN)  IV  3.375 g Intravenous Q8H  . sodium chloride  3 mL Intravenous Q12H  . vancomycin  500 mg Intravenous Q12H    OBJECTIVE  Filed Vitals:   12/16/12 2100 12/17/12 0100 12/17/12 0300 12/17/12 0648    BP: 101/6 107/64 104/64 106/66  Pulse:  83 78   Temp:   97.7 F (36.5 C)   TempSrc:   Axillary   Resp:   18   Height:      Weight:   141 lb 1.5 oz (64 kg)   SpO2: 95% 93% 93% 93%    Intake/Output Summary (Last 24 hours) at 12/17/12 0928 Last data filed at 12/17/12 0920  Gross per 24 hour  Intake   1040 ml  Output    515 ml  Net    525 ml   Filed Weights   12/15/12 0545 12/16/12 0401 12/17/12 0300  Weight: 145 lb 4.5 oz (65.9 kg) 139 lb 15.9 oz (63.5 kg) 141 lb 1.5 oz (64 kg)    PHYSICAL EXAM  General: Pleasant, NAD. Neuro: Alert and oriented X 3. Moves all extremities spontaneously. Psych: Normal affect. HEENT:  Normal  Neck: Supple without bruits, JVD up to jaws Lungs:  Resp regular and unlabored, rales B/L, decreased sounds at the basis Heart: RRR no s3, s4, or murmurs. Abdomen: Soft, non-tender, non-distended, BS + x 4.  Extremities: No clubbing, cyanosis or edema. DP/PT/Radials 2+ and equal bilaterally.  Accessory Clinical Findings  CBC  Recent Labs  12/16/12 0530 12/16/12 1905 12/17/12 0546  WBC 11.6* 10.5 8.5  NEUTROABS 10.4* 9.0*  --   HGB 14.4 14.8 14.6  HCT 43.2 44.2 44.3  MCV 88.0 87.0 86.9  PLT 258 243 288   Basic Metabolic Panel  Recent Labs  12/14/12 1510  12/16/12 1905 12/17/12 0546  NA  --   < > 129* 129*  K  --   < > 4.3 4.0  CL  --   < > 92* 88*  CO2  --   < > 26 24  GLUCOSE  --   < > 102* 181*  BUN  --   < > 43* 56*  CREATININE 0.58  < > 1.05 2.17*  CALCIUM  --   < > 8.9 9.1  MG 1.9  --   --   --   < > = values in this interval not displayed. Liver Function Tests  Recent Labs  12/15/12 0134 12/16/12 0530  AST  --  17  ALT  --  8  ALKPHOS  --  98  BILITOT  --  0.5  PROT 6.7 6.8  ALBUMIN  --  3.1*   Cardiac Enzymes  Recent Labs  12/14/12 1510 12/14/12 1946 12/15/12 0134  TROPONINI <0.30 <0.30 <0.30   Hemoglobin A1C No results found for this basename: HGBA1C,  in the last 72 hours Thyroid Function  Tests  Recent Labs  12/14/12 1510  TSH 1.275    Radiology/Studies  Dg Chest Port 1 View  12/15/2012   CLINICAL DATA:  Shortness of breath, effusion  EXAM: PORTABLE CHEST - 1 VIEW  COMPARISON:  Prior radiograph from 12/14/2012  FINDINGS: Right-sided Port-A-Cath is stable in position. Cardiomegaly is unchanged.  There has been interval worsening of a left-sided effusion with decreased aeration of the left hemi thorax as compared to the most recent examination. Pleural fluid is now seen capping the left lung apex. The known small cell lung cancer in the left lung is likely unchanged. The right hemi thorax is grossly clear. No pneumothorax.  Osseous structures are unchanged.  IMPRESSION: Interval worsening of left pleural effusion with decreased aeration of the left lung as compared to 12/14/12.   Electronically Signed   By: Rise Mu M.D.   On: 12/15/2012 05:54    TELE  NSR to Sinus tachycardia 90-105, frequent PACs, PVCs and short runs of atrial tachycardia  ECG  Normal sinus rhythm, lateral ST depressions, downsloping    ASSESSMENT AND PLAN  79 year old female with small cell lung cancer   1. Pericardial effusion - mostly posterior, LV EF 50 %, no signs of tamponade on the echocardiogram on 12/14/12. The effusion is most probably malignant, the hope was that it will improve with diuretics. However there is ARF with positive fluid balance, the patient is now tachycardiac, BP yesterday 87/50, improved to 100-110/0 with iv fluids. We will order another echo to re-evaluate for potential tamponade.   2. Pleural effusion, lung edema - s/p thoracentesis, improved SOB  with significant diuresis with iv Lasix yesterday, but still fluid overloaded, we will hold Lasix as Crea significantly worsened overnight  3. AKI - hold Lasix   Signed, Tobias Alexander, H MD, [facility] 12/17/2012

## 2012-12-17 NOTE — Progress Notes (Signed)
Echocardiogram 2D Echocardiogram has been performed.  Misty Ball 12/17/2012, 11:39 AM

## 2012-12-17 NOTE — Progress Notes (Signed)
ANTIBIOTIC CONSULT NOTE - Follow-up  Pharmacy Consult for Vancomycin, Zosyn Indication: PNA  Allergies  Allergen Reactions  . Sulfa Antibiotics Rash    Patient Measurements: Height: 5\' 4"  (162.6 cm) Weight: 141 lb 1.5 oz (64 kg) IBW/kg (Calculated) : 54.7  Vital Signs: Temp: 97.7 F (36.5 C) (11/09 0300) Temp src: Axillary (11/09 0300) BP: 106/66 mmHg (11/09 0648) Pulse Rate: 78 (11/09 0300) Intake/Output from previous day: 11/08 0701 - 11/09 0700 In: 1160 [P.O.:360; I.V.:500; IV Piggyback:300] Out: 514 [Urine:303; Emesis/NG output:200; Stool:11] Intake/Output from this shift: Total I/O In: -  Out: 1 [Stool:1]  Labs:  Recent Labs  12/16/12 0530 12/16/12 1905 12/17/12 0546  WBC 11.6* 10.5 8.5  HGB 14.4 14.8 14.6  PLT 258 243 288  CREATININE 0.83 1.05 2.17*   Estimated Creatinine Clearance: 18.2 ml/min (by C-G formula based on Cr of 2.17).   Microbiology: 11/8: S pneumo/[place] Ur Ag: needs to be collected 11/8 blood: collected 11/7 pleural fluid: NGTD 11/6 Sputum: nml oropharyngeal flora 11/6 urine: NGF  Antiinfectives; 11/6 >> azithromycin x 1 11/6 >> ceftriaxone x 1 11/8 >> zosyn >> 11/8 >> vancomycin >>   Assessment: 4 yoF with PMHx HTN, DM, hx pancreatitis, hypothyroidism, COPD, CHF (EF 50-55%) and recent diagnosis of SCLCa s/p radiation and 5 cycles carboplatin/etoposide, planned chemotherapy cisplatin/irinotecan 11/10 for disease progression, presents to [facility] with SOB, pleural effusion and moderate pericardial effusion.    Pt s/p thoracentesis 11/7 with 1700cc fluid removed. 11/8 pt with emesis and lethary and pharmacy consulted to dose vancomycin and zosyn for possible PNA.  Allergy to sulfa antibiotics noted.   Tmax: 98.2 WBCs: 8.5 (last steroid 11/6) Renal: SCr 0.83 --> 2.17, CrCl ~18 ml/min (N=23) Lactic acid: 2.4 (11/9)  Change antibiotic doses due to ARF. Noted patient received Zosyn 3.375g at 1153 today, and vancomycin 1g given last  night at 2000 + 750mg  given this am at 0800   Goal of Therapy:  Vancomycin trough level 15-20 mcg/ml  Plan:  - change Zosyn to 2.25g IV q6h at 2000 tonight - change vancomycin to 1g q48h starting at 0800 on 11/11 - F/u renal fxn, WBC, microbiology, vancomycin trough at [facility], clinical course  Thank you for the consult.  Tomi Bamberger, PharmD Clinical Pharmacist Pager: [phone] Pharmacy: [phone] 12/17/2012 12:04 PM

## 2012-12-18 ENCOUNTER — Other Ambulatory Visit: Payer: Medicare Other | Admitting: Lab

## 2012-12-18 ENCOUNTER — Inpatient Hospital Stay (HOSPITAL_COMMUNITY): Payer: Medicare Other

## 2012-12-18 LAB — LEGIONELLA ANTIGEN, URINE

## 2012-12-18 LAB — COMPREHENSIVE METABOLIC PANEL
ALT: 13 U/L (ref 0–35)
Alkaline Phosphatase: 81 U/L (ref 39–117)
CO2: 24 mEq/L (ref 19–32)
Creatinine, Ser: 2.84 mg/dL — ABNORMAL HIGH (ref 0.50–1.10)
GFR calc Af Amer: 17 mL/min — ABNORMAL LOW (ref >90)
Glucose, Bld: 145 mg/dL — ABNORMAL HIGH (ref 70–99)
Potassium: 3.6 mEq/L (ref 3.5–5.1)
Sodium: 128 mEq/L — ABNORMAL LOW (ref 135–145)
Total Protein: 6.9 g/dL (ref 6.0–8.3)

## 2012-12-18 LAB — CBC WITH DIFFERENTIAL/PLATELET
Basophils Absolute: 0 10*3/uL (ref 0.0–0.1)
Basophils Relative: 0 % (ref 0–1)
Eosinophils Absolute: 0 10*3/uL (ref 0.0–0.7)
Eosinophils Relative: 1 % (ref 0–5)
HCT: 41 % (ref 36.0–46.0)
Lymphocytes Relative: 11 % — ABNORMAL LOW (ref 12–46)
MCH: 29.4 pg (ref 26.0–34.0)
MCHC: 33.9 g/dL (ref 30.0–36.0)
MCV: 86.7 fL (ref 78.0–100.0)
Monocytes Absolute: 0.6 10*3/uL (ref 0.1–1.0)
Neutro Abs: 6.7 10*3/uL (ref 1.7–7.7)
Platelets: 270 10*3/uL (ref 150–400)
RDW: 15.3 % (ref 11.5–15.5)

## 2012-12-18 LAB — BASIC METABOLIC PANEL
BUN: 70 mg/dL — ABNORMAL HIGH (ref 6–23)
Chloride: 87 mEq/L — ABNORMAL LOW (ref 96–112)
Creatinine, Ser: 2.2 mg/dL — ABNORMAL HIGH (ref 0.50–1.10)
GFR calc Af Amer: 23 mL/min — ABNORMAL LOW (ref >90)
GFR calc non Af Amer: 20 mL/min — ABNORMAL LOW (ref >90)
Glucose, Bld: 207 mg/dL — ABNORMAL HIGH (ref 70–99)
Potassium: 3.2 mEq/L — ABNORMAL LOW (ref 3.5–5.1)

## 2012-12-18 LAB — BODY FLUID CULTURE
Culture: NO GROWTH
Gram Stain: NONE SEEN
Special Requests: NORMAL

## 2012-12-18 LAB — GLUCOSE, CAPILLARY
Glucose-Capillary: 139 mg/dL — ABNORMAL HIGH (ref 70–99)
Glucose-Capillary: 159 mg/dL — ABNORMAL HIGH (ref 70–99)
Glucose-Capillary: 174 mg/dL — ABNORMAL HIGH (ref 70–99)

## 2012-12-18 MED ORDER — GUAIFENESIN ER 600 MG PO TB12
1200.0000 mg | ORAL_TABLET | Freq: Two times a day (BID) | ORAL | Status: DC
  Administered 2012-12-18 – 2012-12-21 (×6): 1200 mg via ORAL
  Filled 2012-12-18 (×7): qty 2

## 2012-12-18 MED ORDER — SODIUM CHLORIDE 0.9 % IV SOLN
INTRAVENOUS | Status: DC
  Administered 2012-12-18: 08:00:00 100 mL/h via INTRAVENOUS
  Administered 2012-12-19 – 2012-12-21 (×4): via INTRAVENOUS

## 2012-12-18 NOTE — Progress Notes (Signed)
[facility] PROGRESS NOTE  Misty Ball ZOX:[id number] DOB: [birth year] DOA: 12/14/2012 PCP: [name], MD  Assessment/Plan: #1 acute respiratory distress Likely multifactorial secondary to acute CHF exacerbation, large left pleural effusion, probable pericardial effusion. Doubt if patient if patient has a COPD exacerbation that she's not wheezing and ABG was essentially normal. Patient states she's feeling clinically better.  Repeat chest x-ray showed worsening left pleural effusion. Patient is status post thoracentesis. Repeat chest x-ray concern for multifocal pneumonia. 2-D echo shows a wall motion abnormality as well as a pericardial effusion.Hold Lasix today. Continue empiric IV antibiotics. Strict Is and Os. Daily weights. Pulmonary and cardiology following.   #2 large left pleural effusion Questionable etiology. Status post thoracentesis. Concern for a malignant pleural effusion as patient does have a history of extensive lung cancer being followed by oncology. Chemistries consistent with a transudate. Cytology is pending. Pulmonary is following and appreciate input and recommendations.  #3 pericardial effusion Questionable etiology. Concern for malignant effusion versus chemotherapy-induced effusion. Repeat 2-D echo done today secondary to hypotension we shows decreased pericardial effusion. No signs of tamponade. Cardiology ff  #4 acute diastolic CHF exacerbation Questionable etiology. Cardiac enzymes negative x3. 2-D echo with EF of 50-55% with hypokinesis of the inferior-posterior walls, and a large pericardial effusion noted. I/O. equal +1564cc. Continue to hold Lasix and ACE inhibitor secondary to worsening renal function. Continue  aspirin,  Lopressor. Cardiology following and appreciate input and recommendations.   #5 probable multifocal pneumonia Patient was noted to have emesis yesterday chest x-ray which was done had persistent patchy airspace disease in the left  upper lobe and bilateral lower lobe suspicious for pneumonia. Blood cultures are pending. Continue empiric IV vancomycin and IV Zosyn.  #6 hypotension Questionable etiology. Likely secondary to volume depletion in the setting of underlying diuretics.. Patient with some emesis. Blood pressure responded to IV fluids. Lasix has been discontinued. ACE inhibitor discontinued. Continue hydration monitor fluid status. Follow.  #7 acute renal failure Creatinine is elevated at 2.84 today. Likely prerenal azotemia . Patient was noted to be hypertensive during this hospitalization was on Lasix. Patient with CT scan done on 12/11/2012. ?? ATN. Fractional excretion of sodium is 0.42%. Discontinued Lasix and ACE inhibitor. Will check a renal ultrasound. Will consult with nephrology for further evaluation and management. Follow.  #8 emesis/abdominal pain Questionable etiology. Abdominal films yesterday were negative. Repeat abdominal films negative. Repeat UA. Supportive care, symptomatic treatment.  #9 progressive small cell lung cancer Oncology following.  #10 hypokalemia Replete.  #11 hyponatremia Likely secondary to hypervolemic hyponatremia secondary to CHF exacerbation. Fluctuating.  #12 diabetes mellitus Hemoglobin A1c 7.7. CBGs have ranged from 131- 154. Continue Lantus to 45 units daily. sliding scale insulin to resistant scale.  #13 COPD Stable. Continue nebulizers.  #14 hypertension Stable. Discontinued ACE inhibitor secondary to elevated renal function. Continue low-dose beta blocker.  #15 prophylaxis PPI for GI prophylaxis. Lovenox for DVT prophylaxis.  Code Status: Full Family Communication: Updated patient no family at bedside. Disposition Plan: Home when medically stable.   Consultants:  Cardiology : Dr. Eldridge Dace 12/15/2012  Pulmonary: Dr. Shan Levans 12/15/2012   Nephrology: Dr.  Marisue Humble 12/18/2012  Procedures:  Chest x-ray 12/14/2012, 12/15/2012  2-D echo  12/14/2012, 12/17/2012  Thoracentesis 12/15/2012 Dr. Delford Field  Chest x-ray/KUB 12/16/2012  Renal ultrasound 12/18/2012  Antibiotics:  None  HPI/Subjective: Patient states SOB improved. No CP. Patient with some emesis yesterday. No emesis today. Asking whether she can have a cracker.   Objective: Filed Vitals:  12/18/12 1439  BP: 105/58  Pulse: 83  Temp: 98.2 F (36.8 C)  Resp: 19    Intake/Output Summary (Last 24 hours) at 12/18/12 1554 Last data filed at 12/18/12 1440  Gross per 24 hour  Intake   1550 ml  Output    251 ml  Net   1299 ml   Filed Weights   12/16/12 0401 12/17/12 0300 12/18/12 0510  Weight: 63.5 kg (139 lb 15.9 oz) 64 kg (141 lb 1.5 oz) 63.9 kg (140 lb 14 oz)    Exam:   General: NAD  Cardiovascular: RRR  Respiratory: Decreased breath sounds in the bases left greater than right. No wheezing.  Abdomen: Soft, tender to palpation, nondistended, positive bowel sounds.  Musculoskeletal: No clubbing cyanosis. Trace edema.  Data Reviewed: Basic Metabolic Panel:  Recent Labs Lab 12/14/12 1510  12/16/12 0530 12/16/12 1905 12/17/12 0546 12/18/12 0515 12/18/12 1355  NA  --   < > 131* 129* 129* 128* 127*  K  --   < > 3.8 4.3 4.0 3.6 3.2*  CL  --   < > 90* 92* 88* 86* 87*  CO2  --   < > 30 26 24 24 26   GLUCOSE  --   < > 172* 102* 181* 145* 207*  BUN  --   < > 27* 43* 56* 76* 70*  CREATININE 0.58  < > 0.83 1.05 2.17* 2.84* 2.20*  CALCIUM  --   < > 9.4 8.9 9.1 8.9 8.3*  MG 1.9  --   --   --   --   --   --   PHOS  --   --   --   --   --   --  5.7*  < > = values in this interval not displayed. Liver Function Tests:  Recent Labs Lab 12/14/12 0859 12/15/12 0134 12/16/12 0530 12/18/12 0515  AST 13  --  17 19  ALT 8  --  8 13  ALKPHOS 90  --  98 81  BILITOT 0.4  --  0.5 0.5  PROT 6.8 6.7 6.8 6.9  ALBUMIN 3.3*  --  3.1* 2.6*   No results found for this basename: LIPASE, AMYLASE,  in the last 168 hours No results found for this basename:  AMMONIA,  in the last 168 hours CBC:  Recent Labs Lab 12/14/12 0859  12/15/12 0134 12/16/12 0530 12/16/12 1905 12/17/12 0546 12/18/12 0515  WBC 6.1  < > 6.9 11.6* 10.5 8.5 8.2  NEUTROABS 4.6  --   --  10.4* 9.0*  --  6.7  HGB 13.1  < > 12.9 14.4 14.8 14.6 13.9  HCT 39.0  < > 39.0 43.2 44.2 44.3 41.0  MCV 88.8  < > 87.4 88.0 87.0 86.9 86.7  PLT 229  < > 272 258 243 288 270  < > = values in this interval not displayed. Cardiac Enzymes:  Recent Labs Lab 12/14/12 1510 12/14/12 1946 12/15/12 0134  TROPONINI <0.30 <0.30 <0.30   BNP (last 3 results)  Recent Labs  12/14/12 0859 12/15/12 0134  PROBNP 3571.0* 9868.0*   CBG:  Recent Labs Lab 12/17/12 1206 12/17/12 1644 12/17/12 2339 12/18/12 0745 12/18/12 1155  GLUCAP 154* 131* 159* 139* 154*    Recent Results (from the past 240 hour(s))  CULTURE, EXPECTORATED SPUTUM-ASSESSMENT     Status: None   Collection Time    12/14/12  2:18 PM      Result Value Range Status   Specimen  Description SPUTUM   Final   Special Requests NONE   Final   Sputum evaluation     Final   Value: THIS SPECIMEN IS ACCEPTABLE. RESPIRATORY CULTURE REPORT TO FOLLOW.   Report Status 12/14/2012 FINAL   Final  CULTURE, RESPIRATORY (NON-EXPECTORATED)     Status: None   Collection Time    12/14/12  2:18 PM      Result Value Range Status   Specimen Description SPUTUM   Final   Special Requests NONE   Final   Gram Stain     Final   Value: MODERATE WBC PRESENT,BOTH PMN AND MONONUCLEAR     MODERATE SQUAMOUS EPITHELIAL CELLS PRESENT     FEW GRAM POSITIVE COCCI     IN PAIRS     Performed at [facility]   Culture     Final   Value: NORMAL OROPHARYNGEAL FLORA     Performed at [facility]   Report Status 12/17/2012 FINAL   Final  URINE CULTURE     Status: None   Collection Time    12/14/12  2:58 PM      Result Value Range Status   Specimen Description URINE, RANDOM   Final   Special Requests NONE   Final   Culture  Setup Time      Final   Value: 12/14/2012 20:46     Performed at [facility] Count     Final   Value: NO GROWTH     Performed at [facility]   Culture     Final   Value: NO GROWTH     Performed at [facility]   Report Status 12/15/2012 FINAL   Final  BODY FLUID CULTURE     Status: None   Collection Time    12/15/12 11:35 AM      Result Value Range Status   Specimen Description PLEURAL   Final   Special Requests Normal   Final   Gram Stain     Final   Value: NO WBC SEEN     NO ORGANISMS SEEN     Performed at [facility]   Culture     Final   Value: NO GROWTH 3 DAYS     Performed at [facility]   Report Status 12/18/2012 FINAL   Final  CULTURE, BLOOD (ROUTINE X 2)     Status: None   Collection Time    12/16/12  7:05 PM      Result Value Range Status   Specimen Description BLOOD RIGHT ARM  5 ML IN [facility] BOTTLE   Final   Special Requests NONE   Final   Culture  Setup Time     Final   Value: 12/17/2012 06:18     Performed at [facility]   Culture     Final   Value:        BLOOD CULTURE RECEIVED NO GROWTH TO DATE CULTURE WILL BE HELD FOR 5 DAYS BEFORE ISSUING A FINAL NEGATIVE REPORT     Performed at [facility]   Report Status PENDING   Incomplete  CULTURE, BLOOD (ROUTINE X 2)     Status: None   Collection Time    12/16/12  7:09 PM      Result Value Range Status   Specimen Description BLOOD RIGHT HAND  5 ML IN [facility] BOTTLE   Final   Special Requests NONE   Final  Culture  Setup Time     Final   Value: 12/17/2012 06:18     Performed at [facility]   Culture     Final   Value:        BLOOD CULTURE RECEIVED NO GROWTH TO DATE CULTURE WILL BE HELD FOR 5 DAYS BEFORE ISSUING A FINAL NEGATIVE REPORT     Performed at [facility]   Report Status PENDING   Incomplete     Studies: Dg Abd 1 View  12/17/2012   CLINICAL DATA:  Abdominal pain, nausea and vomiting.  EXAM: ABDOMEN - 1 VIEW  COMPARISON:   12/16/2012.  FINDINGS: Normal bowel gas pattern. Lumbar spine degenerative changes. Atheromatous arterial calcifications. Cholecystectomy clips.  IMPRESSION: No acute abnormality.   Electronically Signed   By: Gordan Payment M.D.   On: 12/17/2012 17:03   US Renal  12/18/2012   CLINICAL DATA:  Acute renal failure  EXAM: RENAL/URINARY TRACT ULTRASOUND COMPLETE  COMPARISON:  12/11/2012  FINDINGS: Right Kidney  Length: 13.5 cm. Echogenicity within normal limits. No mass or hydronephrosis visualized.  Left Kidney  Length: Measures 13.6 cm spot. Echogenicity within normal limits. No mass or hydronephrosis visualized. 5 mm nonobstructing calculus is identified within the inferior pole  Bladder  Appears normal for degree of bladder distention.  IMPRESSION: 1. No evidence for obstructive uropathy.  2. Left renal calculi   Electronically Signed   By: Signa Kell M.D.   On: 12/18/2012 14:41   Dg Chest Port 1 View  12/16/2012   CLINICAL DATA:  Lethargy  EXAM: PORTABLE CHEST - 1 VIEW  COMPARISON:  11/8/ 14  FINDINGS: Cardiomediastinal silhouette is stable. Right Port-A-Cath is unchanged in position. Persistent patchy airspace disease left upper lobe and bilateral lower lobes suspicious for multifocal pneumonia.  IMPRESSION: Persistent patchy airspace disease left upper lobe and bilateral lower lobe suspicious for pneumonia. Stable right Port-A-Cath position.   Electronically Signed   By: Natasha Mead M.D.   On: 12/16/2012 18:17   Dg Abd Portable 1v  12/16/2012   CLINICAL DATA:  Abdominal pain and emesis  EXAM: PORTABLE ABDOMEN - 1 VIEW  COMPARISON:  None.  FINDINGS: There are no abnormally dilated loops of bowel. There is stool distending the rectum somewhat. Cholecystectomy clips are noted in the right upper quadrant.  IMPRESSION: Nonobstructive gas pattern. Mild distal fecal retention.   Electronically Signed   By: Esperanza Heir M.D.   On: 12/16/2012 18:19    Scheduled Meds: . aspirin EC  81 mg Oral Daily  .  clonazePAM  1 mg Oral QHS  . docusate sodium  100 mg Oral BID  . doxepin  25 mg Oral QHS  . enoxaparin (LOVENOX) injection  30 mg Subcutaneous Q24H  . insulin aspart  0-20 Units Subcutaneous TID WC  . insulin glargine  45 Units Subcutaneous QHS  . levothyroxine  112 mcg Oral Q breakfast  . lipase/protease/amylase  1 capsule Oral TID AC  . metoprolol tartrate  12.5 mg Oral Q12H  . piperacillin-tazobactam (ZOSYN)  IV  2.25 g Intravenous Q6H  . sodium chloride  3 mL Intravenous Q12H  . [START ON 12/19/2012] vancomycin  1,000 mg Intravenous Q48H   Continuous Infusions: . sodium chloride 100 mL/hr (12/18/12 0818)    Principal Problem:   Acute respiratory distress Active Problems:   Malignant neoplasm of upper lobe, bronchus or lung   Acute exacerbation of CHF (congestive heart failure)   Pleural effusion on left   Pericardial  effusion   DM   HYPERLIPIDEMIA   HYPERTENSION   COPD (chronic obstructive pulmonary disease)   PNA (pneumonia)   Hypokalemia   Hypotension, unspecified   ARF (acute renal failure)    Time spent: 30 mins    [facility]  [facility] Pager [phone]. If 7PM-7AM, please contact night-coverage at [email or web address], password [email or web address] 12/18/2012, 3:54 PM  LOS: 4 days

## 2012-12-18 NOTE — Progress Notes (Signed)
Subjective: Pt appears comfortable with no increased wob.  Objective: Vital signs in last 24 hours: Blood pressure 112/52, pulse 96, temperature 97.8 F (36.6 C), temperature source Oral, resp. rate 18, height 5\' 4"  (1.626 m), weight 140 lb 14 oz (63.9 kg), SpO2 100.00%.  Intake/Output from previous day: 11/09 0701 - 11/10 0700 In: 1570 [P.O.:480; I.V.:990; IV Piggyback:100] Out: 6 [Urine:2; Emesis/NG output:2; Stool:2]   Physical Exam:   wd female in nad Nose without purulence or d/c noted. Neck without tmg or LN Chest with rhonchi, crackles left base, no wheezing Cor with ?irreg LE with minimal edema, no cyanosis Alert, oriented, moves all 4.    Lab Results:  Recent Labs  12/16/12 1905 12/17/12 0546 12/18/12 0515  WBC 10.5 8.5 8.2  HGB 14.8 14.6 13.9  HCT 44.2 44.3 41.0  PLT 243 288 270   BMET  Recent Labs  12/16/12 1905 12/17/12 0546 12/18/12 0515  NA 129* 129* 128*  K 4.3 4.0 3.6  CL 92* 88* 86*  CO2 26 24 24   GLUCOSE 102* 181* 145*  BUN 43* 56* 76*  CREATININE 1.05 2.17* 2.84*  CALCIUM 8.9 9.1 8.9    Studies/Results: Dg Abd 1 View  12/17/2012   CLINICAL DATA:  Abdominal pain, nausea and vomiting.  EXAM: ABDOMEN - 1 VIEW  COMPARISON:  12/16/2012.  FINDINGS: Normal bowel gas pattern. Lumbar spine degenerative changes. Atheromatous arterial calcifications. Cholecystectomy clips.  IMPRESSION: No acute abnormality.   Electronically Signed   By: Gordan Payment M.D.   On: 12/17/2012 17:03   Dg Chest Port 1 View  12/16/2012   CLINICAL DATA:  Lethargy  EXAM: PORTABLE CHEST - 1 VIEW  COMPARISON:  11/8/ 14  FINDINGS: Cardiomediastinal silhouette is stable. Right Port-A-Cath is unchanged in position. Persistent patchy airspace disease left upper lobe and bilateral lower lobes suspicious for multifocal pneumonia.  IMPRESSION: Persistent patchy airspace disease left upper lobe and bilateral lower lobe suspicious for pneumonia. Stable right Port-A-Cath position.    Electronically Signed   By: Natasha Mead M.D.   On: 12/16/2012 18:17   Dg Abd Portable 1v  12/16/2012   CLINICAL DATA:  Abdominal pain and emesis  EXAM: PORTABLE ABDOMEN - 1 VIEW  COMPARISON:  None.  FINDINGS: There are no abnormally dilated loops of bowel. There is stool distending the rectum somewhat. Cholecystectomy clips are noted in the right upper quadrant.  IMPRESSION: Nonobstructive gas pattern. Mild distal fecal retention.   Electronically Signed   By: Esperanza Heir M.D.   On: 12/16/2012 18:19    Assessment/Plan:  1) left pleural effusion unknown origin.  Unclear whether related to small cell lung ca vs heart failure.  S/p thoracentesis -transudate by chemistries, but high lymphocyte predominance may indicate +cancer (small cells are often confused for lymphocytes in pleural fluid).  Need to f/u formal cytology on fluid. Follow cytology - I spoke to path - needs special stains & may take 1-2 days. Can be followed by oncology  PCCM to sign off  Azara Gemme V. [phone]  12/18/2012, 11:49 AM

## 2012-12-18 NOTE — Consult Note (Signed)
Misty Ball 12/18/2012 Misty Ball Requesting Physician:  Ramiro Harvest  Reason for Consult:  AKI HPI:  26F with extensive stage SCLC without any recent CTX in past 2 months. Recent CT (w/ IV contrast, 12/11/12) demonstrated progression of disease and plan was made to resume CTX which has not yet started.  She was admitted 12/14/12 with SOB.  She underwent thoracentesis on 11/7, , transudative in nature.  She has had IV diuresis from admission to yesterday and her lisinopril was stopped yesterday.  No home NSAIDs.    Today she has some suprapubic pain which comes and goes.  No weakness in legs.  No incontinence.  She denies difficulty voiding.     Creat (mg/dL)  Date Value  9/60/4540 0.66   04/18/2012 0.80   12/30/2011 0.69   12/09/2011 0.63   09/09/2011 0.65   04/22/2011 0.69      Creatinine (mg/dL)  Date Value  98/02/1912 0.8   10/25/2012 0.8   10/18/2012 0.8   10/11/2012 0.7   10/04/2012 0.7   09/27/2012 0.8   09/20/2012 0.8   09/13/2012 0.8   09/06/2012 0.8   08/30/2012 0.9      Creatinine, Ser (mg/dL)  Date Value  78/29/5621 2.84*  12/17/2012 2.17*  12/16/2012 1.05   12/16/2012 0.83   12/15/2012 0.75   12/14/2012 0.58   12/14/2012 0.51   03/28/2012 0.73   08/30/2010 0.63   06/18/2010 0.61   ] I/Os: doesn't appear complete I/O last 3 completed shifts: In: 1870 [P.O.:480; I.V.:990; IV Piggyback:400] Out: 220 [Urine:5; Emesis/NG output:202; Stool:13]   ROS NSAIDS: no exposure identified IV Contrast: CTC/A/P with IV contrast 12/11/12 TMP/SMX no exposure identified Hypotension: emesis 11/8 - 11/9.  ACEi and furosemide stopped 11/9.   Fleets Enema 12/16/12 Balance of 12 systems is negative w/ exceptions as above   PMH  Past Medical History  Diagnosis Date  . Hypertension   . Pancreatitis   . Diabetes mellitus   . Thyroid disease   . lung ca dx'd 06/2012  . History of radiation therapy 07/20/2012-08/14/2012    37.5 Gy to left lung  . History of chemotherapy  07/12/2012    carboplatin and etoposide  . Shortness of breath    PSH  Past Surgical History  Procedure Laterality Date  . Abdominal hysterectomy    . Cholecystectomy    . Appendectomy    . Video bronchoscopy Bilateral 05/10/2012    Procedure: VIDEO BRONCHOSCOPY WITHOUT FLUORO;  Surgeon: Nyoka Cowden, MD;  Location: [facility];  Service: Endoscopy;  Laterality: Bilateral;   FH  Family History  Problem Relation Age of Onset  . Diabetes Mother   . Diabetes Daughter   . Diabetes Son   . Diabetes Son    SH  reports that she has been smoking Cigarettes.  She has been smoking about 0.00 packs per day for the past 52 years. She has never used smokeless tobacco. She reports that she does not drink alcohol or use illicit drugs. Allergies  Allergies  Allergen Reactions  . Sulfa Antibiotics Rash   Home medications Prior to Admission medications   Medication Sig Start Date End Date Taking? Authorizing Provider  aspirin EC 81 MG tablet Take 81 mg by mouth daily.   Yes Historical Provider, MD  clonazePAM (KLONOPIN) 0.5 MG tablet Take 2 tablets (1 mg total) by mouth at bedtime. 11/29/12  Yes [name], MD  doxepin (SINEQUAN) 25 MG capsule Take 1 capsule (25 mg total) by mouth  at bedtime. 11/29/12  Yes [name], MD  Insulin Glargine (LANTUS SOLOSTAR) 100 UNIT/ML SOPN Inject 0.42 mLs into the skin daily. 07/07/12  Yes [name], MD  levothyroxine (SYNTHROID, LEVOTHROID) 112 MCG tablet Take 1 tablet (112 mcg total) by mouth daily. 11/29/12 11/29/13 Yes [name], MD  lidocaine-prilocaine (EMLA) cream Apply topically as needed. Apply to port 1 hour before chemotherapy 07/12/12  Yes Si Gaul, MD  lipase/protease/amylase (CREON-12/PANCREASE) 12000 UNITS CPEP capsule Take 1 capsule by mouth 3 (three) times daily before meals. 11/29/12  Yes [name], MD  prochlorperazine (COMPAZINE) 10 MG tablet Take 1 tablet (10 mg total) by mouth every 6 (six) hours as needed.  09/15/12  Yes Conni Slipper, PA-C  B-D ULTRAFINE III SHORT PEN 31G X 8 MM MISC USE AS DIRECTED EVERY MORNING 12/09/12   Nelva Nay, PA-C    Current Medications Current Facility-Administered Medications  Medication Dose Route Frequency Provider Last Rate Last Dose  . 0.9 %  sodium chloride infusion  250 mL Intravenous PRN Rodolph Bong, MD      . 0.9 %  sodium chloride infusion   Intravenous Continuous Rodolph Bong, MD 100 mL/hr at 12/18/12 0818 100 mL/hr at 12/18/12 0818  . acetaminophen (TYLENOL) tablet 650 mg  650 mg Oral Q4H PRN Rodolph Bong, MD   650 mg at 12/17/12 0420  . albuterol (PROVENTIL) (5 MG/ML) 0.5% nebulizer solution 2.5 mg  2.5 mg Nebulization Q4H PRN Rodolph Bong, MD      . aspirin EC tablet 81 mg  81 mg Oral Daily Rodolph Bong, MD   81 mg at 12/18/12 1140  . clonazePAM (KLONOPIN) tablet 1 mg  1 mg Oral QHS Rodolph Bong, MD   1 mg at 12/18/12 0000  . docusate sodium (COLACE) capsule 100 mg  100 mg Oral BID Rodolph Bong, MD   100 mg at 12/18/12 1140  . doxepin (SINEQUAN) capsule 25 mg  25 mg Oral QHS Rodolph Bong, MD   25 mg at 12/18/12 0000  . enoxaparin (LOVENOX) injection 30 mg  30 mg Subcutaneous Q24H Rodolph Bong, MD   30 mg at 12/17/12 2359  . feeding supplement (GLUCERNA SHAKE) (GLUCERNA SHAKE) liquid 237 mL  237 mL Oral Daily PRN Lorraine Lax, RD      . insulin aspart (novoLOG) injection 0-20 Units  0-20 Units Subcutaneous TID WC Rodolph Bong, MD   4 Units at 12/18/12 1249  . insulin glargine (LANTUS) injection 45 Units  45 Units Subcutaneous QHS Rodolph Bong, MD   45 Units at 12/17/12 2359  . ipratropium (ATROVENT) nebulizer solution 0.5 mg  0.5 mg Nebulization Q4H PRN Rodolph Bong, MD      . levothyroxine (SYNTHROID, LEVOTHROID) tablet 112 mcg  112 mcg Oral Q breakfast Rodolph Bong, MD   112 mcg at 12/18/12 [phone]  . lipase/protease/amylase (CREON-12/PANCREASE) capsule 1 capsule  1 capsule Oral TID  AC Rodolph Bong, MD   1 capsule at 12/18/12 1140  . metoprolol tartrate (LOPRESSOR) tablet 12.5 mg  12.5 mg Oral Q12H Rodolph Bong, MD   12.5 mg at 12/18/12 1140  . ondansetron (ZOFRAN) injection 4 mg  4 mg Intravenous Q6H PRN Rodolph Bong, MD      . ondansetron [facility]) injection 4 mg  4 mg Intravenous Q6H PRN Rodolph Bong, MD   4 mg at 12/17/12 1732  . piperacillin-tazobactam (ZOSYN) IVPB 2.25 g  2.25 g Intravenous Q6H Laurence Slate, RPH   2.25 g at 12/18/12 1610  . prochlorperazine (COMPAZINE) tablet 10 mg  10 mg Oral Q6H PRN Rodolph Bong, MD      . sodium chloride 0.9 % injection 3 mL  3 mL Intravenous Q12H Rodolph Bong, MD   3 mL at 12/16/12 2217  . sodium chloride 0.9 % injection 3 mL  3 mL Intravenous PRN Rodolph Bong, MD      . Melene Muller ON 12/19/2012] vancomycin (VANCOCIN) IVPB 1000 mg/200 mL premix  1,000 mg Intravenous Q48H Laurence Slate, [facility]         CBC  Recent Labs Lab 12/16/12 0530 12/16/12 1905 12/17/12 0546 12/18/12 0515  WBC 11.6* 10.5 8.5 8.2  NEUTROABS 10.4* 9.0*  --  6.7  HGB 14.4 14.8 14.6 13.9  HCT 43.2 44.2 44.3 41.0  MCV 88.0 87.0 86.9 86.7  PLT 258 243 288 270   Basic Metabolic Panel  Recent Labs Lab 12/14/12 0859 12/14/12 1510 12/15/12 0134 12/16/12 0530 12/16/12 1905 12/17/12 0546 12/18/12 0515  NA 130*  --  129* 131* 129* 129* 128*  K 3.4*  --  4.0 3.8 4.3 4.0 3.6  CL 95*  --  92* 90* 92* 88* 86*  CO2 25  --  28 30 26 24 24   GLUCOSE 222*  --  341* 172* 102* 181* 145*  BUN 16  --  22 27* 43* 56* 76*  CREATININE 0.51 0.58 0.75 0.83 1.05 2.17* 2.84*  CALCIUM 9.0  --  9.3 9.4 8.9 9.1 8.9    Physical Exam  Blood pressure 112/52, pulse 96, temperature 97.8 F (36.6 C), temperature source Oral, resp. rate 18, height 5\' 4"  (1.626 m), weight 63.9 kg (140 lb 14 oz), SpO2 100.00%. GEN: Ill appearing elderly female; groggy ENT: NCAT.   EYES: EOMI, anicteric CV: RRR, nl s1s2 PULM: faint BS in L lung base.  Diminished  bs bilaterally ABD: some suprapubic tenderness.   SKIN: PORT site not accessed.  No rashes.   EXT:no LEE NEURO: mae x4   Asssement 60F with extensive SCLC not recently receiving CTX with AKI.  She likely has ATN related to contrast (nadier SCr 0.5, increased within 96h of contrast exposure, diuresis, ACEi use, and potential hypovolemia).  Phosphosoda bowel preparations can cause phosphate precipitation in kidneys and should not be used in AKI, esp if hypovolemic.  She has some suprapubic tenderness and with a hx/o cancer post renal causes need to be conclusively excluded.  Currently her K, HCO3, and resp status are stable.    1. AKI, likely ATN, multifactorial including contrast nephropathy, rule out post renal 2. Extensive SCLC  Plan 1. Agree with held diuretics, ACEi 2. Cont with gentle hydration, goal is to maintain euvolemia and normotension 3. Strict I/Os 4. Post void I&O catheterization now, if PVR > place foley catheter; renal US pending 5. If pt with significant urinary retention, would place foley catheter 6. No further phosphosoda bowel preps,  Add phos level to today's lab 7. Dose all medications for appropriate creatinine clerance.  Avoid NSAIDs, IV Contrast.    Sabra Heck MD 12/18/2012, 1:06 PM

## 2012-12-18 NOTE — Progress Notes (Signed)
Data reviewed. Patient asleep and resting comfortably. Repeat echo yesterday suggests decrease in pericardial effusion size. No evidence for tamponade. Elevated creatinine and hypotension yesterday suggest volume contraction. No specific cardiac recommendations today.

## 2012-12-19 LAB — CBC
Hemoglobin: 12.8 g/dL (ref 12.0–15.0)
MCHC: 33.3 g/dL (ref 30.0–36.0)
Platelets: 236 10*3/uL (ref 150–400)
RDW: 14.6 % (ref 11.5–15.5)

## 2012-12-19 LAB — URINALYSIS, ROUTINE W REFLEX MICROSCOPIC
Glucose, UA: NEGATIVE mg/dL
Hgb urine dipstick: NEGATIVE
Ketones, ur: NEGATIVE mg/dL
Protein, ur: NEGATIVE mg/dL

## 2012-12-19 LAB — BASIC METABOLIC PANEL
BUN: 57 mg/dL — ABNORMAL HIGH (ref 6–23)
CO2: 31 mEq/L (ref 19–32)
Calcium: 9 mg/dL (ref 8.4–10.5)
Creatinine, Ser: 1.46 mg/dL — ABNORMAL HIGH (ref 0.50–1.10)
GFR calc Af Amer: 38 mL/min — ABNORMAL LOW (ref >90)
GFR calc non Af Amer: 33 mL/min — ABNORMAL LOW (ref >90)
Potassium: 3.5 mEq/L (ref 3.5–5.1)
Sodium: 129 mEq/L — ABNORMAL LOW (ref 135–145)

## 2012-12-19 LAB — GLUCOSE, CAPILLARY
Glucose-Capillary: 71 mg/dL (ref 70–99)
Glucose-Capillary: 170 mg/dL — ABNORMAL HIGH (ref 70–99)
Glucose-Capillary: 88 mg/dL (ref 70–99)
Glucose-Capillary: 130 mg/dL — ABNORMAL HIGH (ref 70–99)

## 2012-12-19 LAB — URINE CULTURE
Colony Count: NO GROWTH
Culture: NO GROWTH

## 2012-12-19 MED ORDER — INSULIN GLARGINE 100 UNIT/ML ~~LOC~~ SOLN
35.0000 [IU] | Freq: Every day | SUBCUTANEOUS | Status: DC
  Filled 2012-12-19: qty 0.35

## 2012-12-19 MED ORDER — INSULIN ASPART 100 UNIT/ML ~~LOC~~ SOLN
0.0000 [IU] | Freq: Three times a day (TID) | SUBCUTANEOUS | Status: DC
  Administered 2012-12-20 (×2): 2 [IU] via SUBCUTANEOUS

## 2012-12-19 MED ORDER — VANCOMYCIN HCL IN DEXTROSE 750-5 MG/150ML-% IV SOLN
750.0000 mg | INTRAVENOUS | Status: DC
  Administered 2012-12-20: 750 mg via INTRAVENOUS
  Filled 2012-12-19: qty 150

## 2012-12-19 MED ORDER — INSULIN GLARGINE 100 UNIT/ML ~~LOC~~ SOLN
25.0000 [IU] | Freq: Every day | SUBCUTANEOUS | Status: DC
  Administered 2012-12-19: 22:00:00 25 [IU] via SUBCUTANEOUS
  Filled 2012-12-19 (×2): qty 0.25

## 2012-12-19 MED ORDER — PIPERACILLIN-TAZOBACTAM 3.375 G IVPB
3.3750 g | Freq: Three times a day (TID) | INTRAVENOUS | Status: DC
  Administered 2012-12-19 – 2012-12-21 (×6): 3.375 g via INTRAVENOUS
  Filled 2012-12-19 (×8): qty 50

## 2012-12-19 NOTE — Progress Notes (Signed)
ANTIBIOTIC CONSULT NOTE - Follow-up  Pharmacy Consult for Vancomycin, Zosyn Indication: PNA  Allergies  Allergen Reactions  . Sulfa Antibiotics Rash    Patient Measurements: Height: 5\' 4"  (162.6 cm) Weight: 145 lb 15.1 oz (66.2 kg) IBW/kg (Calculated) : 54.7  Vital Signs: Temp: 97.5 F (36.4 C) (11/11 0555) Temp src: Oral (11/11 0555) BP: 120/51 mmHg (11/11 0555) Pulse Rate: 67 (11/11 0555) Intake/Output from previous day: 11/10 0701 - 11/11 0700 In: 340 [P.O.:240; IV Piggyback:100] Out: 1200 [Urine:1200]  Labs:  Recent Labs  12/17/12 0546 12/17/12 1638 12/18/12 0515 12/18/12 1355 12/19/12 0435  WBC 8.5  --  8.2  --  6.0  HGB 14.6  --  13.9  --  12.8  PLT 288  --  270  --  236  LABCREA  --  123.6  --   --   --   CREATININE 2.17*  --  2.84* 2.20* 1.46*   Estimated Creatinine Clearance: 29.2 ml/min (by C-G formula based on Cr of 1.46).   Microbiology: 11/11 Urine:  sent 11/9 Urine: NGF 11/8: S pneumo/[place] Ur Ag: negative 11/8 blood: ngtd 11/7 pleural fluid: NGF 11/6 Sputum: nml oropharyngeal flora 11/6 urine: NGF  Antiinfectives; 11/6 >> azithromycin x 1 11/6 >> ceftriaxone x 1 11/8 >> zosyn >> 11/8 >> vancomycin >>   Assessment: 72 yoF with PMHx HTN, DM, hx pancreatitis, hypothyroidism, COPD, CHF (EF 50-55%) and recent diagnosis of SCLCa s/p radiation and 5 cycles carboplatin/etoposide, planned chemotherapy cisplatin/irinotecan 11/10 for disease progression, presents to [facility] with SOB, pleural effusion and moderate pericardial effusion.   Pt s/p thoracentesis 11/7 with 1700cc fluid removed. 11/8 pt with emesis and lethary and pharmacy consulted to dose vancomycin and zosyn for possible PNA.  Allergy to sulfa antibiotics noted.   Tmax: afeb WBCs: 6 (last steroid 11/6) Renal: SCr significantly improved, 2.2 > 1.46 with CrCl ~ 29 ( N)  11/11  Change antibiotic doses due to improvement in ARF. Noted patient last received Zosyn 2.25g and vancomycin 1g  today at 0800.     Goal of Therapy:  Vancomycin trough level 15-20 mcg/ml  Plan:   Change to Zosyn 3.375g IV Q8H infused over 4hrs.   Change to Vancomycin 750mg  IV q24h, next dose 11/12 am.  Measure Vanc trough tomorrow morning, before 0800 dose.  Follow up renal fxn and culture results.  Thank you for the consult.  Lynann Beaver PharmD, BCPS Pager [phone] 12/19/2012 11:54 AM

## 2012-12-19 NOTE — Progress Notes (Signed)
[facility] PROGRESS NOTE  Misty Ball ZOX:[id number] DOB: 04-09-1931 DOA: 12/14/2012 PCP: [name], MD  Assessment/Plan: #1 acute respiratory distress Likely multifactorial secondary to acute CHF exacerbation, large left pleural effusion, probable pericardial effusion, pneumonia. Doubt if patient if patient has a COPD exacerbation that she's not wheezing and ABG was essentially normal. Patient states she's feeling clinically better.  Repeat chest x-ray showed worsening left pleural effusion. Patient is status post thoracentesis. Repeat chest x-ray concern for multifocal pneumonia. 2-D echo shows a wall motion abnormality as well as a pericardial effusion.Hold Lasix. Continue empiric IV antibiotics. Strict Is and Os. Daily weights. Pulmonary and cardiology following.   #2 large left pleural effusion Questionable etiology. Status post thoracentesis. Concern for a malignant pleural effusion as patient does have a history of extensive lung cancer being followed by oncology. Chemistries consistent with a transudate. Cytology with reactive mesothelial cells. Pulmonary is following and appreciate input and recommendations.  #3 pericardial effusion Questionable etiology. Concern for malignant effusion versus chemotherapy-induced effusion. Repeat 2-D echo done secondary to hypotension, shows decreased pericardial effusion. No signs of tamponade. Cardiology ff  #4 acute diastolic CHF exacerbation Questionable etiology. Cardiac enzymes negative x3. 2-D echo with EF of 50-55% with hypokinesis of the inferior-posterior walls, and a large pericardial effusion noted. I/O. equal +1564cc. Continue to hold Lasix and ACE inhibitor secondary to worsening renal function. Continue  aspirin,  Lopressor. Cardiology following and appreciate input and recommendations.   #5 probable multifocal pneumonia Patient was noted to have emesis yesterday chest x-ray which was done had persistent patchy airspace  disease in the left upper lobe and bilateral lower lobe suspicious for pneumonia. Blood cultures are pending. Continue empiric IV vancomycin and IV Zosyn.  #6 hypotension Questionable etiology. Likely secondary to volume depletion in the setting of underlying diuretics.. Patient with some emesis. Blood pressure responded to IV fluids. Lasix has been discontinued. ACE inhibitor discontinued. Continue hydration monitor fluid status. Follow.  #7 acute renal failure Creatinine is elevated at 2.84 today. Likely prerenal azotemia versus ATN . Patient was noted to be hypotensive during this hospitalization was on Lasix. Patient with CT scan done on 12/11/2012. ?? ATN. Fractional excretion of sodium is 0.42%. Discontinued Lasix and ACE inhibitor. Renal ultrasound negative for obstructive uropathy. Renal function improved. Continue gentle hydration. Nephrology following and appreciate input and recommendations.  #8 emesis/abdominal pain Questionable etiology. Abdominal films yesterday were negative. Repeat abdominal films negative. Repeat UA negative. Supportive care, symptomatic treatment.  #9 progressive small cell lung cancer Oncology following.  #10 hypokalemia Replete.  #11 hyponatremia Likely secondary to hypervolemic hyponatremia secondary to CHF exacerbation. Fluctuating.  #12 diabetes mellitus Hemoglobin A1c 7.7. CBGs have ranged from 62- 170. Decrease Lantus to 25 units daily. Change sliding scale to sensitive.  #13 COPD Stable. Continue nebulizers.  #14 hypertension Stable. Discontinued ACE inhibitor secondary to elevated renal function. Continue low-dose beta blocker.  #15 prophylaxis PPI for GI prophylaxis. Lovenox for DVT prophylaxis.  Code Status: Full Family Communication: Updated patient no family at bedside. Disposition Plan: Home when medically stable.   Consultants:  Cardiology : Dr. Eldridge Dace 12/15/2012  Pulmonary: Dr. Shan Levans 12/15/2012   Nephrology: Dr.   Marisue Humble 12/18/2012  Procedures:  Chest x-ray 12/14/2012, 12/15/2012  2-D echo 12/14/2012, 12/17/2012  Thoracentesis 12/15/2012 Dr. Delford Field  Chest x-ray/KUB 12/16/2012  Renal ultrasound 12/18/2012  Antibiotics:  None  HPI/Subjective: Patient states SOB improved. No CP. Patient with no emesis today.  Objective: Filed Vitals:   12/19/12 1319  BP: 150/58  Pulse: 79  Temp: 98.5 F (36.9 C)  Resp: 18    Intake/Output Summary (Last 24 hours) at 12/19/12 1425 Last data filed at 12/19/12 1300  Gross per 24 hour  Intake    650 ml  Output   1150 ml  Net   -500 ml   Filed Weights   12/17/12 0300 12/18/12 0510 12/19/12 0555  Weight: 64 kg (141 lb 1.5 oz) 63.9 kg (140 lb 14 oz) 66.2 kg (145 lb 15.1 oz)    Exam:   General: NAD  Cardiovascular: RRR  Respiratory: Decreased breath sounds in the bases left greater than right. No wheezing.  Abdomen: Soft, tender to palpation, nondistended, positive bowel sounds.  Musculoskeletal: No clubbing cyanosis. Trace edema.  Data Reviewed: Basic Metabolic Panel:  Recent Labs Lab 12/14/12 1510  12/16/12 1905 12/17/12 0546 12/18/12 0515 12/18/12 1355 12/19/12 0435  NA  --   < > 129* 129* 128* 127* 129*  K  --   < > 4.3 4.0 3.6 3.2* 3.5  CL  --   < > 92* 88* 86* 87* 89*  CO2  --   < > 26 24 24 26 31   GLUCOSE  --   < > 102* 181* 145* 207* 85  BUN  --   < > 43* 56* 76* 70* 57*  CREATININE 0.58  < > 1.05 2.17* 2.84* 2.20* 1.46*  CALCIUM  --   < > 8.9 9.1 8.9 8.3* 9.0  MG 1.9  --   --   --   --   --   --   PHOS  --   --   --   --   --  5.7*  --   < > = values in this interval not displayed. Liver Function Tests:  Recent Labs Lab 12/14/12 0859 12/15/12 0134 12/16/12 0530 12/18/12 0515  AST 13  --  17 19  ALT 8  --  8 13  ALKPHOS 90  --  98 81  BILITOT 0.4  --  0.5 0.5  PROT 6.8 6.7 6.8 6.9  ALBUMIN 3.3*  --  3.1* 2.6*   No results found for this basename: LIPASE, AMYLASE,  in the last 168 hours No results found  for this basename: AMMONIA,  in the last 168 hours CBC:  Recent Labs Lab 12/14/12 0859  12/16/12 0530 12/16/12 1905 12/17/12 0546 12/18/12 0515 12/19/12 0435  WBC 6.1  < > 11.6* 10.5 8.5 8.2 6.0  NEUTROABS 4.6  --  10.4* 9.0*  --  6.7  --   HGB 13.1  < > 14.4 14.8 14.6 13.9 12.8  HCT 39.0  < > 43.2 44.2 44.3 41.0 38.4  MCV 88.8  < > 88.0 87.0 86.9 86.7 86.1  PLT 229  < > 258 243 288 270 236  < > = values in this interval not displayed. Cardiac Enzymes:  Recent Labs Lab 12/14/12 1510 12/14/12 1946 12/15/12 0134  TROPONINI <0.30 <0.30 <0.30   BNP (last 3 results)  Recent Labs  12/14/12 0859 12/15/12 0134  PROBNP 3571.0* 9868.0*   CBG:  Recent Labs Lab 12/18/12 1645 12/18/12 2248 12/19/12 0802 12/19/12 0817 12/19/12 1202  GLUCAP 213* 174* 62* 71 170*    Recent Results (from the past 240 hour(s))  CULTURE, EXPECTORATED SPUTUM-ASSESSMENT     Status: None   Collection Time    12/14/12  2:18 PM      Result Value Range Status   Specimen Description SPUTUM   Final  Special Requests NONE   Final   Sputum evaluation     Final   Value: THIS SPECIMEN IS ACCEPTABLE. RESPIRATORY CULTURE REPORT TO FOLLOW.   Report Status 12/14/2012 FINAL   Final  CULTURE, RESPIRATORY (NON-EXPECTORATED)     Status: None   Collection Time    12/14/12  2:18 PM      Result Value Range Status   Specimen Description SPUTUM   Final   Special Requests NONE   Final   Gram Stain     Final   Value: MODERATE WBC PRESENT,BOTH PMN AND MONONUCLEAR     MODERATE SQUAMOUS EPITHELIAL CELLS PRESENT     FEW GRAM POSITIVE COCCI     IN PAIRS     Performed at [facility]   Culture     Final   Value: NORMAL OROPHARYNGEAL FLORA     Performed at [facility]   Report Status 12/17/2012 FINAL   Final  URINE CULTURE     Status: None   Collection Time    12/14/12  2:58 PM      Result Value Range Status   Specimen Description URINE, RANDOM   Final   Special Requests NONE   Final    Culture  Setup Time     Final   Value: 12/14/2012 20:46     Performed at [facility] Count     Final   Value: NO GROWTH     Performed at [facility]   Culture     Final   Value: NO GROWTH     Performed at [facility]   Report Status 12/15/2012 FINAL   Final  BODY FLUID CULTURE     Status: None   Collection Time    12/15/12 11:35 AM      Result Value Range Status   Specimen Description PLEURAL   Final   Special Requests Normal   Final   Gram Stain     Final   Value: NO WBC SEEN     NO ORGANISMS SEEN     Performed at [facility]   Culture     Final   Value: NO GROWTH 3 DAYS     Performed at [facility]   Report Status 12/18/2012 FINAL   Final  CULTURE, BLOOD (ROUTINE X 2)     Status: None   Collection Time    12/16/12  7:05 PM      Result Value Range Status   Specimen Description BLOOD RIGHT ARM  5 ML IN [facility] BOTTLE   Final   Special Requests NONE   Final   Culture  Setup Time     Final   Value: 12/17/2012 06:18     Performed at [facility]   Culture     Final   Value:        BLOOD CULTURE RECEIVED NO GROWTH TO DATE CULTURE WILL BE HELD FOR 5 DAYS BEFORE ISSUING A FINAL NEGATIVE REPORT     Performed at [facility]   Report Status PENDING   Incomplete  CULTURE, BLOOD (ROUTINE X 2)     Status: None   Collection Time    12/16/12  7:09 PM      Result Value Range Status   Specimen Description BLOOD RIGHT HAND  5 ML IN [facility] BOTTLE   Final   Special Requests NONE   Final   Culture  Setup Time  Final   Value: 12/17/2012 06:18     Performed at [facility]   Culture     Final   Value:        BLOOD CULTURE RECEIVED NO GROWTH TO DATE CULTURE WILL BE HELD FOR 5 DAYS BEFORE ISSUING A FINAL NEGATIVE REPORT     Performed at [facility]   Report Status PENDING   Incomplete  URINE CULTURE     Status: None   Collection Time    12/17/12  4:38 PM      Result Value Range Status   Specimen  Description URINE, CATHETERIZED   Final   Special Requests NONE   Final   Culture  Setup Time     Final   Value: 12/18/2012 00:03     Performed at [facility] Count     Final   Value: NO GROWTH     Performed at [facility]   Culture     Final   Value: NO GROWTH     Performed at [facility]   Report Status 12/19/2012 FINAL   Final     Studies: Dg Abd 1 View  12/17/2012   CLINICAL DATA:  Abdominal pain, nausea and vomiting.  EXAM: ABDOMEN - 1 VIEW  COMPARISON:  12/16/2012.  FINDINGS: Normal bowel gas pattern. Lumbar spine degenerative changes. Atheromatous arterial calcifications. Cholecystectomy clips.  IMPRESSION: No acute abnormality.   Electronically Signed   By: Gordan Payment M.D.   On: 12/17/2012 17:03   US Renal  12/18/2012   CLINICAL DATA:  Acute renal failure  EXAM: RENAL/URINARY TRACT ULTRASOUND COMPLETE  COMPARISON:  12/11/2012  FINDINGS: Right Kidney  Length: 13.5 cm. Echogenicity within normal limits. No mass or hydronephrosis visualized.  Left Kidney  Length: Measures 13.6 cm spot. Echogenicity within normal limits. No mass or hydronephrosis visualized. 5 mm nonobstructing calculus is identified within the inferior pole  Bladder  Appears normal for degree of bladder distention.  IMPRESSION: 1. No evidence for obstructive uropathy.  2. Left renal calculi   Electronically Signed   By: Signa Kell M.D.   On: 12/18/2012 14:41    Scheduled Meds: . aspirin EC  81 mg Oral Daily  . clonazePAM  1 mg Oral QHS  . docusate sodium  100 mg Oral BID  . doxepin  25 mg Oral QHS  . enoxaparin (LOVENOX) injection  30 mg Subcutaneous Q24H  . guaiFENesin  1,200 mg Oral BID  . insulin aspart  0-20 Units Subcutaneous TID WC  . insulin glargine  35 Units Subcutaneous QHS  . levothyroxine  112 mcg Oral Q breakfast  . lipase/protease/amylase  1 capsule Oral TID AC  . metoprolol tartrate  12.5 mg Oral Q12H  . piperacillin-tazobactam (ZOSYN)  IV  3.375 g  Intravenous Q8H  . sodium chloride  3 mL Intravenous Q12H  . [START ON 12/20/2012] vancomycin  750 mg Intravenous Q24H   Continuous Infusions: . sodium chloride 100 mL/hr at 12/19/12 0151    Principal Problem:   Acute respiratory distress Active Problems:   Malignant neoplasm of upper lobe, bronchus or lung   Acute exacerbation of CHF (congestive heart failure)   Pleural effusion on left   Pericardial effusion   DM   HYPERLIPIDEMIA   HYPERTENSION   COPD (chronic obstructive pulmonary disease)   PNA (pneumonia)   Hypokalemia   Hypotension, unspecified   ARF (acute renal failure)    Time spent: 30 mins  [facility]  [facility] Pager [phone]. If 7PM-7AM, please contact night-coverage at [email or web address], password [facility] 12/19/2012, 2:25 PM  LOS: 5 days

## 2012-12-19 NOTE — Progress Notes (Signed)
SUBJECTIVE:  Lying flat and breathing comfortably.  She says her SOB is about the same as yesterday  OBJECTIVE:   Vitals:   Filed Vitals:   12/18/12 0610 12/18/12 1439 12/18/12 2023 12/19/12 0555  BP: 112/52 105/58 141/73 120/51  Pulse: 96 83 83 67  Temp:  98.2 F (36.8 C) 97.8 F (36.6 C) 97.5 F (36.4 C)  TempSrc:  Oral Oral Oral  Resp:  19 20 16   Height:      Weight:    145 lb 15.1 oz (66.2 kg)  SpO2: 100% 98% 100% 96%   I&O's:   Intake/Output Summary (Last 24 hours) at 12/19/12 1100 Last data filed at 12/19/12 0605  Gross per 24 hour  Intake    290 ml  Output    950 ml  Net   -660 ml   TELEMETRY: Reviewed telemetry pt in NSR:     PHYSICAL EXAM General: Well developed, well nourished, in no acute distress Head: Eyes PERRLA, No xanthomas.   Normal cephalic and atramatic  Lungs:   Crackles at bases bilaterally Heart:   HRRR S1 S2 Pulses are 2+ & equal. Abdomen: Bowel sounds are positive, abdomen soft and non-tender without masses Extremities:   No clubbing, cyanosis or edema.  DP +1 Neuro: Alert and oriented X 3. Psych:  Good affect, responds appropriately   LABS: Basic Metabolic Panel:  Recent Labs  96/04/54 0515 12/18/12 1355 12/19/12 0435  NA 128* 127* 129*  K 3.6 3.2* 3.5  CL 86* 87* 89*  CO2 24 26 31   GLUCOSE 145* 207* 85  BUN 76* 70* 57*  CREATININE 2.84* 2.20* 1.46*  CALCIUM 8.9 8.3* 9.0  PHOS  --  5.7*  --    Liver Function Tests:  Recent Labs  12/18/12 0515  AST 19  ALT 13  ALKPHOS 81  BILITOT 0.5  PROT 6.9  ALBUMIN 2.6*   No results found for this basename: LIPASE, AMYLASE,  in the last 72 hours CBC:  Recent Labs  12/16/12 1905  12/18/12 0515 12/19/12 0435  WBC 10.5  < > 8.2 6.0  NEUTROABS 9.0*  --  6.7  --   HGB 14.8  < > 13.9 12.8  HCT 44.2  < > 41.0 38.4  MCV 87.0  < > 86.7 86.1  PLT 243  < > 270 236  < > = values in this interval not displayed. Coag Panel:   Lab Results  Component Value Date   INR 0.99  12/14/2012   INR 0.98 07/31/2012   INR 0.95 06/22/2012    RADIOLOGY: Dg Chest 2 View  12/14/2012   CLINICAL DATA:  Shortness of breath, cough, chest tightness  EXAM: CHEST  2 VIEW  COMPARISON:  The CT chest of 12/11/2012 and chest x-ray of 05/17/2012  FINDINGS: As noted on recent CT, much of the opacity throughout the left hemi thorax is due to left effusion as well as known small cell carcinoma in the left lung. There is more opacity at the right lung base, and the considerations are that of superimposed pneumonia or edema. Cardiomegaly is stable. A right-sided palpable Port-A-Cath is present. Mild cardiomegaly is stable. [name]  IMPRESSION: 1. Volume loss throughout the left hemi thorax due to known left effusion and left lung carcinoma. 2. More opacity in the right mid lung and lung base may be due to pneumonia or edema. 3. Cardiomegaly.   Electronically Signed   By: Dwyane Dee M.D.   On: 12/14/2012 09:13  Dg Abd 1 View  12/17/2012   CLINICAL DATA:  Abdominal pain, nausea and vomiting.  EXAM: ABDOMEN - 1 VIEW  COMPARISON:  12/16/2012.  FINDINGS: Normal bowel gas pattern. Lumbar spine degenerative changes. Atheromatous arterial calcifications. Cholecystectomy clips.  IMPRESSION: No acute abnormality.   Electronically Signed   By: Gordan Payment M.D.   On: 12/17/2012 17:03   Ct Chest W Contrast  12/11/2012   CLINICAL DATA:  Small cell lung cancer. Cough. Previous radiation therapy and chemotherapy. Nausea and vomiting.  EXAM: CT CHEST, ABDOMEN, AND PELVIS WITH CONTRAST  TECHNIQUE: Multidetector CT imaging of the chest, abdomen and pelvis was performed following the standard protocol during bolus administration of intravenous contrast.  CONTRAST:  OMNIPAQUE IOHEXOL 300 MG/ML  SOLN  COMPARISON:  09/27/2012.  FINDINGS: CT CHEST FINDINGS  No pathologically enlarged mediastinal lymph nodes. Bi hilar lymphoid tissue. No axillary adenopathy. Atherosclerotic calcification of the arterial vasculature, including  extensive involvement of the coronary arteries. Ascending aorta measures 4 cm. Incidental note is made of left vertebral artery origin from the proximal left subclavian artery or aortic arch. Pulmonary arteries are enlarged. Heart is enlarged. No pericardial effusion.  Moderate left pleural effusion, new. No definite pleural nodularity. Progressive consolidative change in the left upper lobe, including the lingular portion. Compressive atelectasis in the left lower lobe. Scarring and volume loss in the right hemi thorax. A few scattered tiny nodular densities in the right lung are likely unchanged. Airway is otherwise unremarkable.  CT ABDOMEN AND PELVIS FINDINGS  Minimal intrahepatic and extrahepatic biliary duct dilatation, unchanged. Liver is otherwise unremarkable. Cholecystectomy.  Adrenal nodules have enlarged, measuring 2.2 x 1.9 cm on the right (previously 1.1 x 1.4 cm) and 2.0 x 2.7 cm on the left (previously 1.0 x 1.6 cm).  Renal vascular calcifications bilaterally. Sub cm low-attenuation lesions in the right kidney are too small to characterize but stable. Possible left renal stones, no obstruction. Spleen, pancreas, stomach and bowel are unremarkable. Incidental note is made of a duodenal diverticulum.  Atherosclerotic calcification of the arterial vasculature without abdominal aortic aneurysm. Hysterectomy. No pathologically enlarged lymph nodes. No free fluid. There is focal dilatation of the left gonadal vein, anterior to the left psoas muscle (series 2, image 88), simulating a lymph node. Finding is unchanged. Small bowel mesenteric lymph nodes measure up to 8 mm in short axis, more prominent on the prior exam but not enlarged by CT size criteria.  A small rounded sclerotic lesion in the right iliac wing (image 90) is unchanged. Areas of sclerosis in the iliac wings, adjacent to the sacroiliac joints, also appear stable. Degenerative changes are seen throughout the spine. Possible old fracture  involving the left 5th anterolateral rib. Mild T6 compression fracture is unchanged.  IMPRESSION: 1. Interval progression of small cell lung cancer, as evidenced by increasing consolidation in the left upper lobe, new moderate left pleural effusion and enlarging bilateral adrenal metastases. 2. Areas of sclerosis in the iliac wings appear stable. 3. Borderline ascending aortic aneurysm. Enlarged pulmonary arteries are indicative of pulmonary arterial hypertension. Extensive coronary artery calcifications. 4. Possible left renal stones. 5. Increase in prominence of mesenteric lymph nodes, not pathologically enlarged by CT size criteria, nonspecific. Continued attention on followup exams is warranted.   Electronically Signed   By: Leanna Battles M.D.   On: 12/11/2012 10:49   Ct Abdomen Pelvis W Contrast  12/11/2012   CLINICAL DATA:  Small cell lung cancer. Cough. Previous radiation therapy and chemotherapy. Nausea and  vomiting.  EXAM: CT CHEST, ABDOMEN, AND PELVIS WITH CONTRAST  TECHNIQUE: Multidetector CT imaging of the chest, abdomen and pelvis was performed following the standard protocol during bolus administration of intravenous contrast.  CONTRAST:  OMNIPAQUE IOHEXOL 300 MG/ML  SOLN  COMPARISON:  09/27/2012.  FINDINGS: CT CHEST FINDINGS  No pathologically enlarged mediastinal lymph nodes. Bi hilar lymphoid tissue. No axillary adenopathy. Atherosclerotic calcification of the arterial vasculature, including extensive involvement of the coronary arteries. Ascending aorta measures 4 cm. Incidental note is made of left vertebral artery origin from the proximal left subclavian artery or aortic arch. Pulmonary arteries are enlarged. Heart is enlarged. No pericardial effusion.  Moderate left pleural effusion, new. No definite pleural nodularity. Progressive consolidative change in the left upper lobe, including the lingular portion. Compressive atelectasis in the left lower lobe. Scarring and volume loss in  the right hemi thorax. A few scattered tiny nodular densities in the right lung are likely unchanged. Airway is otherwise unremarkable.  CT ABDOMEN AND PELVIS FINDINGS  Minimal intrahepatic and extrahepatic biliary duct dilatation, unchanged. Liver is otherwise unremarkable. Cholecystectomy.  Adrenal nodules have enlarged, measuring 2.2 x 1.9 cm on the right (previously 1.1 x 1.4 cm) and 2.0 x 2.7 cm on the left (previously 1.0 x 1.6 cm).  Renal vascular calcifications bilaterally. Sub cm low-attenuation lesions in the right kidney are too small to characterize but stable. Possible left renal stones, no obstruction. Spleen, pancreas, stomach and bowel are unremarkable. Incidental note is made of a duodenal diverticulum.  Atherosclerotic calcification of the arterial vasculature without abdominal aortic aneurysm. Hysterectomy. No pathologically enlarged lymph nodes. No free fluid. There is focal dilatation of the left gonadal vein, anterior to the left psoas muscle (series 2, image 88), simulating a lymph node. Finding is unchanged. Small bowel mesenteric lymph nodes measure up to 8 mm in short axis, more prominent on the prior exam but not enlarged by CT size criteria.  A small rounded sclerotic lesion in the right iliac wing (image 90) is unchanged. Areas of sclerosis in the iliac wings, adjacent to the sacroiliac joints, also appear stable. Degenerative changes are seen throughout the spine. Possible old fracture involving the left 5th anterolateral rib. Mild T6 compression fracture is unchanged.  IMPRESSION: 1. Interval progression of small cell lung cancer, as evidenced by increasing consolidation in the left upper lobe, new moderate left pleural effusion and enlarging bilateral adrenal metastases. 2. Areas of sclerosis in the iliac wings appear stable. 3. Borderline ascending aortic aneurysm. Enlarged pulmonary arteries are indicative of pulmonary arterial hypertension. Extensive coronary artery calcifications.  4. Possible left renal stones. 5. Increase in prominence of mesenteric lymph nodes, not pathologically enlarged by CT size criteria, nonspecific. Continued attention on followup exams is warranted.   Electronically Signed   By: Leanna Battles M.D.   On: 12/11/2012 10:49   US Renal  12/18/2012   CLINICAL DATA:  Acute renal failure  EXAM: RENAL/URINARY TRACT ULTRASOUND COMPLETE  COMPARISON:  12/11/2012  FINDINGS: Right Kidney  Length: 13.5 cm. Echogenicity within normal limits. No mass or hydronephrosis visualized.  Left Kidney  Length: Measures 13.6 cm spot. Echogenicity within normal limits. No mass or hydronephrosis visualized. 5 mm nonobstructing calculus is identified within the inferior pole  Bladder  Appears normal for degree of bladder distention.  IMPRESSION: 1. No evidence for obstructive uropathy.  2. Left renal calculi   Electronically Signed   By: Signa Kell M.D.   On: 12/18/2012 14:41   Dg Chest [facility]  1 View  12/16/2012   CLINICAL DATA:  Lethargy  EXAM: PORTABLE CHEST - 1 VIEW  COMPARISON:  11/8/ 14  FINDINGS: Cardiomediastinal silhouette is stable. Right Port-A-Cath is unchanged in position. Persistent patchy airspace disease left upper lobe and bilateral lower lobes suspicious for multifocal pneumonia.  IMPRESSION: Persistent patchy airspace disease left upper lobe and bilateral lower lobe suspicious for pneumonia. Stable right Port-A-Cath position.   Electronically Signed   By: Natasha Mead M.D.   On: 12/16/2012 18:17   Dg Chest Port 1 View  12/16/2012   CLINICAL DATA:  Cough, shortness of breath, follow-up pleural effusion  EXAM: PORTABLE CHEST - 1 VIEW  COMPARISON:  12/15/2012  FINDINGS: Patchy left upper lobe opacity, suspicious for pneumonia. Additional patchy bilateral lower lobe opacities, atelectasis versus pneumonia.  Left pleural effusion is not well visualized on the current study, likely improved. No pneumothorax.  Cardiomegaly.  Stable right chest port.  IMPRESSION: Patchy left  upper lobe opacity, suspicious for pneumonia.  Additional patchy bilateral lower lobe opacities, atelectasis versus pneumonia.  Left pleural effusion is not well visualized and likely improved.   Electronically Signed   By: Charline Bills M.D.   On: 12/16/2012 10:49   Dg Chest Port 1 View  12/15/2012   CLINICAL DATA:  Status post left thoracentesis.  EXAM: PORTABLE CHEST - 1 VIEW  COMPARISON:  Multiple priors  FINDINGS: Right Port-A-Cath is unchanged in position. Cardiomediastinal silhouette is unchanged. Interval decrease in left pleural effusion following thoracentesis. Opacity is again identified at the left lung apex and left base. Mild right basilar opacity is noted. There is prominence of the interstitial markings.  IMPRESSION: 1. Improvement in left lung aeration following thoracentesis with residual left basilar opacity. No pneumothorax.  2.  Mild right basilar opacity, likely atelectasis.  3.  Continued left upper lobe opacity.  4.  Pulmonary vascular congestion.   Electronically Signed   By: Jerene Dilling M.D.   On: 12/15/2012 12:01   Dg Chest Port 1 View  12/15/2012   CLINICAL DATA:  Shortness of breath, effusion  EXAM: PORTABLE CHEST - 1 VIEW  COMPARISON:  Prior radiograph from 12/14/2012  FINDINGS: Right-sided Port-A-Cath is stable in position. Cardiomegaly is unchanged.  There has been interval worsening of a left-sided effusion with decreased aeration of the left hemi thorax as compared to the most recent examination. Pleural fluid is now seen capping the left lung apex. The known small cell lung cancer in the left lung is likely unchanged. The right hemi thorax is grossly clear. No pneumothorax.  Osseous structures are unchanged.  IMPRESSION: Interval worsening of left pleural effusion with decreased aeration of the left lung as compared to 12/14/12.   Electronically Signed   By: Rise Mu M.D.   On: 12/15/2012 05:54   Dg Abd Portable 1v  12/16/2012   CLINICAL DATA:   Abdominal pain and emesis  EXAM: PORTABLE ABDOMEN - 1 VIEW  COMPARISON:  None.  FINDINGS: There are no abnormally dilated loops of bowel. There is stool distending the rectum somewhat. Cholecystectomy clips are noted in the right upper quadrant.  IMPRESSION: Nonobstructive gas pattern. Mild distal fecal retention.   Electronically Signed   By: Esperanza Heir M.D.   On: 12/16/2012 18:19    ASSESSMENT AND PLAN  79 year old female with small cell lung cancer  1. Pericardial effusion - mostly posterior, LV EF 50 %, no signs of tamponade on the echocardiogram on 12/14/12 and repeat yesterday showed decrease in  size.  The effusion is most probably malignant. 2. Pleural effusion, lung edema - s/p thoracentesis, improved SOB with significant diuresis  but still fluid overloaded.  Diuretics on hold due to AKI 3. AKI - improved after holding Lasix.  4.  Hypotension - resolved after stopping diuretics.  Most likely from volume contraction.  Would hold diuretics one more day    Quintella Reichert, MD  12/19/2012  11:00 AM

## 2012-12-19 NOTE — Progress Notes (Signed)
Inpatient Diabetes Program Recommendations  AACE/ADA: New Consensus Statement on Inpatient Glycemic Control (2013)  Target Ranges:  Prepandial:   less than 140 mg/dL      Peak postprandial:   less than 180 mg/dL (1-2 hours)      Critically ill patients:  140 - 180 mg/dL   Reason for Assessment: Hypoglycemia  Results for Misty Ball, Misty Ball (MRN [id number]) as of 12/19/2012 13:38  Ref. Range 12/18/2012 07:45 12/18/2012 11:55 12/18/2012 16:45 12/18/2012 22:48 12/19/2012 08:02 12/19/2012 08:17 12/19/2012 12:02  Glucose-Capillary Latest Range: 70-99 mg/dL 147 (H) 829 (H) 562 (H) 174 (H) 62 (L) 71 170 (H)    Please consider decreasing Lantus dose.  Home dose is Lantus 42 units.  Thank you.  Dajon Lazar S. Elsie Lincoln, RN, CNS, CDE Inpatient Diabetes Program, team pager [phone]

## 2012-12-19 NOTE — Progress Notes (Signed)
Admit: 12/14/2012 LOS: 5  69F with extensive small cell lung ca with AKI related to volume contraction, contrast exposure, ACEi.    Subjective:  SCr improved Renal US without obstruction Good UOP  Not thirsty, drinking ad lib; appetite is ok  11/10 0701 - 11/11 0700 In: 340 [P.O.:240; IV Piggyback:100] Out: 1200 [Urine:1200]  Filed Weights   12/17/12 0300 12/18/12 0510 12/19/12 0555  Weight: 64 kg (141 lb 1.5 oz) 63.9 kg (140 lb 14 oz) 66.2 kg (145 lb 15.1 oz)    Current meds: reviewed Current Labs: reviewed    Physical Exam:  Blood pressure 120/51, pulse 67, temperature 97.5 F (36.4 C), temperature source Oral, resp. rate 16, height 5\' 4"  (1.626 m), weight 66.2 kg (145 lb 15.1 oz), SpO2 96.00%. GEN: Ill appearing elderly female; groggy  ENT: NCAT.  EYES: EOMI, anicteric  CV: RRR, nl s1s2  PULM: faint BS in L lung base. Diminished bs bilaterally  ABD: some suprapubic tenderness.  SKIN: PORT site not accessed. No rashes.  EXT:no LEE  NEURO: mae x4  Assessment/Plan 1. AKI: improving and likely 2/2 volume contractiion, contrast, ATN.  No evidence of obstruction. Nonoliguric.  Pt clinically improved.  No changes today.  Goal is to maintain euvolemia at this point.  Would not restart diuretics from a renal perspective.    Sabra Heck MD 12/19/2012, 12:30 PM   Recent Labs Lab 12/18/12 0515 12/18/12 1355 12/19/12 0435  NA 128* 127* 129*  K 3.6 3.2* 3.5  CL 86* 87* 89*  CO2 24 26 31   GLUCOSE 145* 207* 85  BUN 76* 70* 57*  CREATININE 2.84* 2.20* 1.46*  CALCIUM 8.9 8.3* 9.0  PHOS  --  5.7*  --     Recent Labs Lab 12/16/12 0530 12/16/12 1905 12/17/12 0546 12/18/12 0515 12/19/12 0435  WBC 11.6* 10.5 8.5 8.2 6.0  NEUTROABS 10.4* 9.0*  --  6.7  --   HGB 14.4 14.8 14.6 13.9 12.8  HCT 43.2 44.2 44.3 41.0 38.4  MCV 88.0 87.0 86.9 86.7 86.1  PLT 258 243 288 270 236    Current Facility-Administered Medications  Medication Dose Route Frequency Provider Last  Rate Last Dose  . 0.9 %  sodium chloride infusion  250 mL Intravenous PRN Rodolph Bong, MD      . 0.9 %  sodium chloride infusion   Intravenous Continuous Rodolph Bong, MD 100 mL/hr at 12/19/12 0151    . acetaminophen (TYLENOL) tablet 650 mg  650 mg Oral Q4H PRN Rodolph Bong, MD   650 mg at 12/18/12 1702  . albuterol (PROVENTIL) (5 MG/ML) 0.5% nebulizer solution 2.5 mg  2.5 mg Nebulization Q4H PRN Rodolph Bong, MD      . aspirin EC tablet 81 mg  81 mg Oral Daily Rodolph Bong, MD   81 mg at 12/19/12 1022  . clonazePAM (KLONOPIN) tablet 1 mg  1 mg Oral QHS Rodolph Bong, MD   1 mg at 12/18/12 2122  . docusate sodium (COLACE) capsule 100 mg  100 mg Oral BID Rodolph Bong, MD   100 mg at 12/19/12 1022  . doxepin (SINEQUAN) capsule 25 mg  25 mg Oral QHS Rodolph Bong, MD   25 mg at 12/18/12 2122  . enoxaparin (LOVENOX) injection 30 mg  30 mg Subcutaneous Q24H Rodolph Bong, MD   30 mg at 12/18/12 2123  . feeding supplement (GLUCERNA SHAKE) (GLUCERNA SHAKE) liquid 237 mL  237 mL Oral Daily PRN  Lorraine Lax, RD      . guaiFENesin (MUCINEX) 12 hr tablet 1,200 mg  1,200 mg Oral BID Rodolph Bong, MD   1,200 mg at 12/19/12 1022  . insulin aspart (novoLOG) injection 0-20 Units  0-20 Units Subcutaneous TID WC Rodolph Bong, MD   7 Units at 12/18/12 1656  . insulin glargine (LANTUS) injection 45 Units  45 Units Subcutaneous QHS Rodolph Bong, MD   45 Units at 12/18/12 2124  . ipratropium (ATROVENT) nebulizer solution 0.5 mg  0.5 mg Nebulization Q4H PRN Rodolph Bong, MD      . levothyroxine (SYNTHROID, LEVOTHROID) tablet 112 mcg  112 mcg Oral Q breakfast Rodolph Bong, MD   112 mcg at 12/19/12 0820  . lipase/protease/amylase (CREON-12/PANCREASE) capsule 1 capsule  1 capsule Oral TID AC Rodolph Bong, MD   1 capsule at 12/19/12 0820  . metoprolol tartrate (LOPRESSOR) tablet 12.5 mg  12.5 mg Oral Q12H Rodolph Bong, MD   12.5 mg at 12/19/12  1022  . ondansetron (ZOFRAN) injection 4 mg  4 mg Intravenous Q6H PRN Rodolph Bong, MD      . ondansetron [facility]) injection 4 mg  4 mg Intravenous Q6H PRN Rodolph Bong, MD   4 mg at 12/17/12 1732  . piperacillin-tazobactam (ZOSYN) IVPB 3.375 g  3.375 g Intravenous Q8H Christine E Shade, RPH      . prochlorperazine (COMPAZINE) tablet 10 mg  10 mg Oral Q6H PRN Rodolph Bong, MD      . sodium chloride 0.9 % injection 3 mL  3 mL Intravenous Q12H Rodolph Bong, MD   3 mL at 12/18/12 2129  . sodium chloride 0.9 % injection 3 mL  3 mL Intravenous PRN Rodolph Bong, MD      . Melene Muller ON 12/20/2012] vancomycin (VANCOCIN) IVPB 750 mg/150 ml premix  750 mg Intravenous Q24H Winfield Rast, RPH

## 2012-12-20 ENCOUNTER — Telehealth: Payer: Self-pay | Admitting: Internal Medicine

## 2012-12-20 LAB — GLUCOSE, CAPILLARY
Glucose-Capillary: 51 mg/dL — ABNORMAL LOW (ref 70–99)
Glucose-Capillary: 89 mg/dL (ref 70–99)
Glucose-Capillary: 156 mg/dL — ABNORMAL HIGH (ref 70–99)
Glucose-Capillary: 166 mg/dL — ABNORMAL HIGH (ref 70–99)
Glucose-Capillary: 184 mg/dL — ABNORMAL HIGH (ref 70–99)

## 2012-12-20 LAB — CBC
HCT: 40.6 % (ref 36.0–46.0)
Hemoglobin: 13.2 g/dL (ref 12.0–15.0)
MCH: 28.8 pg (ref 26.0–34.0)
MCHC: 32.5 g/dL (ref 30.0–36.0)
MCV: 88.5 fL (ref 78.0–100.0)
Platelets: 257 10*3/uL (ref 150–400)
RDW: 14.6 % (ref 11.5–15.5)

## 2012-12-20 LAB — BASIC METABOLIC PANEL
BUN: 27 mg/dL — ABNORMAL HIGH (ref 6–23)
Creatinine, Ser: 0.81 mg/dL (ref 0.50–1.10)
GFR calc Af Amer: 78 mL/min — ABNORMAL LOW (ref >90)
GFR calc non Af Amer: 67 mL/min — ABNORMAL LOW (ref >90)
Glucose, Bld: 93 mg/dL (ref 70–99)
Potassium: 3.3 mEq/L — ABNORMAL LOW (ref 3.5–5.1)

## 2012-12-20 LAB — URINE CULTURE: Culture: 25000

## 2012-12-20 MED ORDER — VANCOMYCIN HCL 500 MG IV SOLR
500.0000 mg | Freq: Two times a day (BID) | INTRAVENOUS | Status: DC
  Filled 2012-12-20 (×2): qty 500

## 2012-12-20 MED ORDER — ENSURE COMPLETE PO LIQD
237.0000 mL | ORAL | Status: DC
  Administered 2012-12-21: 237 mL via ORAL

## 2012-12-20 MED ORDER — DEXTROSE 50 % IV SOLN: 50.0000 mL | Freq: Once | INTRAVENOUS | Status: AC | PRN

## 2012-12-20 MED ORDER — ENOXAPARIN SODIUM 40 MG/0.4ML ~~LOC~~ SOLN
40.0000 mg | SUBCUTANEOUS | Status: DC
  Administered 2012-12-20: 22:00:00 40 mg via SUBCUTANEOUS
  Filled 2012-12-20 (×2): qty 0.4

## 2012-12-20 MED ORDER — INSULIN GLARGINE 100 UNIT/ML ~~LOC~~ SOLN
10.0000 [IU] | Freq: Every day | SUBCUTANEOUS | Status: DC
  Administered 2012-12-20: 10 [IU] via SUBCUTANEOUS
  Filled 2012-12-20: qty 0.1

## 2012-12-20 MED ORDER — INSULIN ASPART 100 UNIT/ML ~~LOC~~ SOLN
0.0000 [IU] | SUBCUTANEOUS | Status: DC
  Administered 2012-12-20 – 2012-12-21 (×2): 2 [IU] via SUBCUTANEOUS

## 2012-12-20 MED ORDER — GLUCOSE-VITAMIN C 4-6 GM-MG PO CHEW: 4.0000 | CHEWABLE_TABLET | ORAL | Status: DC | PRN

## 2012-12-20 MED ORDER — VANCOMYCIN HCL 500 MG IV SOLR
500.0000 mg | Freq: Two times a day (BID) | INTRAVENOUS | Status: DC
  Administered 2012-12-20 – 2012-12-21 (×2): 500 mg via INTRAVENOUS
  Filled 2012-12-20 (×3): qty 500

## 2012-12-20 MED ORDER — GLUCOSE 40 % PO GEL: 1.0000 | ORAL | Status: DC | PRN

## 2012-12-20 NOTE — Progress Notes (Signed)
Inpatient Diabetes Program Recommendations  AACE/ADA: New Consensus Statement on Inpatient Glycemic Control (2013)  Target Ranges:  Prepandial:   less than 140 mg/dL      Peak postprandial:   less than 180 mg/dL (1-2 hours)      Critically ill patients:  140 - 180 mg/dL   Reason for Visit: Results for PIXIE, BURGENER (MRN [id number]) as of 12/20/2012 13:44  Ref. Range 12/20/2012 05:55 12/20/2012 06:30 12/20/2012 06:33 12/20/2012 07:30  Glucose-Capillary Latest Range: 70-99 mg/dL 51 (L) 49 (L) 77 89   CBG low this AM.  Consider reducing Lantus further to 20 units daily.   Thanks, Beryl Meager, RN, BC-ADM Inpatient Diabetes Coordinator Pager [phone]

## 2012-12-20 NOTE — Telephone Encounter (Signed)
pts son came to ofc stating pt had missed 11/10 tx and is currently inpatient here at [facility] since 11/6 most likely will miss 11/14 appts here SW Day Nurse Judeth Cornfield who said adv son to wait until out of hospital and then call us for rescheduling...shh

## 2012-12-20 NOTE — Progress Notes (Signed)
Patient's O2 sats have maintained 95-98% on 4-L all through the night, she has been on 6L.  Will pass information along to day nurse to follow up on.  Ernesta Amble, RN

## 2012-12-20 NOTE — Progress Notes (Addendum)
[facility] PROGRESS NOTE  Misty Ball:[id number] DOB: 1931-04-03 DOA: 12/14/2012 PCP: [name], MD  Assessment/Plan: #1 acute respiratory distress Likely multifactorial secondary to acute CHF exacerbation, large left pleural effusion, probable pericardial effusion, pneumonia. Doubt if patient if patient has a COPD exacerbation that she's not wheezing and ABG was essentially normal. Patient states she's feeling clinically better.  Repeat chest x-ray showed worsening left pleural effusion. Patient is status post thoracentesis. Repeat chest x-ray concern for multifocal pneumonia. 2-D echo shows a wall motion abnormality as well as a pericardial effusion.Hold Lasix. Continue empiric IV antibiotics. Strict Is and Os. Daily weights. Pulmonary and cardiology following.   #2 large left pleural effusion Questionable etiology. Status post thoracentesis. Concern for a malignant pleural effusion as patient does have a history of extensive lung cancer being followed by oncology. Chemistries consistent with a transudate. Cytology with reactive mesothelial cells. Pulmonary is following and appreciate input and recommendations.  #3 pericardial effusion Questionable etiology. Concern for malignant effusion versus chemotherapy-induced effusion. Repeat 2-D echo done secondary to hypotension, shows decreased pericardial effusion. No signs of tamponade. Cardiology ff  #4 acute diastolic CHF exacerbation Questionable etiology. Cardiac enzymes negative x3. 2-D echo with EF of 50-55% with hypokinesis of the inferior-posterior walls, and a large pericardial effusion noted. I/O. equal +1564cc. Continue to hold Lasix and ACE inhibitor secondary to worsening renal function. Continue  aspirin,  Lopressor. Cardiology following and appreciate input and recommendations.   #5 probable multifocal pneumonia Patient was noted to have emesis yesterday chest x-ray which was done had persistent patchy airspace  disease in the left upper lobe and bilateral lower lobe suspicious for pneumonia. Blood cultures are pending. Continue empiric IV vancomycin and IV Zosyn.  #6 hypotension Questionable etiology. Likely secondary to volume depletion in the setting of underlying diuretics.. Patient with some emesis. Blood pressure responded to IV fluids. Lasix has been discontinued. ACE inhibitor discontinued. Continue hydration monitor fluid status. Follow.  #7 acute renal failure Creatinine is elevated at 2.84 today. Likely prerenal azotemia versus ATN . Patient was noted to be hypotensive during this hospitalization was on Lasix. Patient with CT scan done on 12/11/2012. ?? ATN. Fractional excretion of sodium is 0.42%. Discontinued Lasix and ACE inhibitor. Renal ultrasound negative for obstructive uropathy. Renal function improved. Continue gentle hydration. Nephrology following and appreciate input and recommendations.  #8 emesis/abdominal pain Questionable etiology. Abdominal films yesterday were negative. Repeat abdominal films negative. Repeat UA negative. Supportive care, symptomatic treatment.  #9 progressive small cell lung cancer Oncology following.  #10 hypokalemia Replete.  #11 hyponatremia Likely secondary to hypervolemic hyponatremia secondary to CHF exacerbation. Fluctuating.  #12 diabetes mellitus Hemoglobin A1c 7.7. CBGs have ranged from 62- 170. Decrease Lantus to 10 units at bedtime because of hypoglycemia episode this am. Changed to q4hr cbgs'. #13 COPD Stable. Continue nebulizers.  #14 hypertension Stable. Discontinued ACE inhibitor secondary to elevated renal function. Continue low-dose beta blocker.  #15 prophylaxis PPI for GI prophylaxis. Lovenox for DVT prophylaxis.  Code Status: Full Family Communication: Updated patient no family at bedside. Disposition Plan: Home when medically stable.   Consultants:  Cardiology : Dr. Eldridge Dace 12/15/2012  Pulmonary: Dr. Shan Levans  12/15/2012   Nephrology: Dr.  Marisue Humble 12/18/2012  Procedures:  Chest x-ray 12/14/2012, 12/15/2012  2-D echo 12/14/2012, 12/17/2012  Thoracentesis 12/15/2012 Dr. Delford Field  Chest x-ray/KUB 12/16/2012  Renal ultrasound 12/18/2012  Antibiotics:  None  HPI/Subjective: Patient states SOB improved. No CP. Patient with no emesis today. she denies any other complaints.  Objective: Filed Vitals:   12/20/12 0935  BP: 129/56  Pulse: 71  Temp: 98.6 F (37 C)  Resp:     Intake/Output Summary (Last 24 hours) at 12/20/12 1825 Last data filed at 12/20/12 1801  Gross per 24 hour  Intake 1576.75 ml  Output    850 ml  Net 726.75 ml   Filed Weights   12/18/12 0510 12/19/12 0555 12/20/12 0620  Weight: 63.9 kg (140 lb 14 oz) 66.2 kg (145 lb 15.1 oz) 65.7 kg (144 lb 13.5 oz)    Exam:   General: NAD  Cardiovascular: RRR  Respiratory: Decreased breath sounds in the bases left greater than right. No wheezing.  Abdomen: Soft, tender to palpation, nondistended, positive bowel sounds.  Musculoskeletal: No clubbing cyanosis. Trace edema.  Data Reviewed: Basic Metabolic Panel:  Recent Labs Lab 12/14/12 1510  12/17/12 0546 12/18/12 0515 12/18/12 1355 12/19/12 0435 12/20/12 0655  NA  --   < > 129* 128* 127* 129* 137  K  --   < > 4.0 3.6 3.2* 3.5 3.3*  CL  --   < > 88* 86* 87* 89* 98  CO2  --   < > 24 24 26 31 31   GLUCOSE  --   < > 181* 145* 207* 85 93  BUN  --   < > 56* 76* 70* 57* 27*  CREATININE 0.58  < > 2.17* 2.84* 2.20* 1.46* 0.81  CALCIUM  --   < > 9.1 8.9 8.3* 9.0 8.6  MG 1.9  --   --   --   --   --   --   PHOS  --   --   --   --  5.7*  --   --   < > = values in this interval not displayed. Liver Function Tests:  Recent Labs Lab 12/14/12 0859 12/15/12 0134 12/16/12 0530 12/18/12 0515  AST 13  --  17 19  ALT 8  --  8 13  ALKPHOS 90  --  98 81  BILITOT 0.4  --  0.5 0.5  PROT 6.8 6.7 6.8 6.9  ALBUMIN 3.3*  --  3.1* 2.6*   No results found for this  basename: LIPASE, AMYLASE,  in the last 168 hours No results found for this basename: AMMONIA,  in the last 168 hours CBC:  Recent Labs Lab 12/14/12 0859  12/16/12 0530 12/16/12 1905 12/17/12 0546 12/18/12 0515 12/19/12 0435 12/20/12 0655  WBC 6.1  < > 11.6* 10.5 8.5 8.2 6.0 6.6  NEUTROABS 4.6  --  10.4* 9.0*  --  6.7  --   --   HGB 13.1  < > 14.4 14.8 14.6 13.9 12.8 13.2  HCT 39.0  < > 43.2 44.2 44.3 41.0 38.4 40.6  MCV 88.8  < > 88.0 87.0 86.9 86.7 86.1 88.5  PLT 229  < > 258 243 288 270 236 257  < > = values in this interval not displayed. Cardiac Enzymes:  Recent Labs Lab 12/14/12 1510 12/14/12 1946 12/15/12 0134  TROPONINI <0.30 <0.30 <0.30   BNP (last 3 results)  Recent Labs  12/14/12 0859 12/15/12 0134  PROBNP 3571.0* 9868.0*   CBG:  Recent Labs Lab 12/20/12 0630 12/20/12 0633 12/20/12 0730 12/20/12 1208 12/20/12 1719  GLUCAP 49* 77 89 156* 166*    Recent Results (from the past 240 hour(s))  CULTURE, EXPECTORATED SPUTUM-ASSESSMENT     Status: None   Collection Time    12/14/12  2:18 PM  Result Value Range Status   Specimen Description SPUTUM   Final   Special Requests NONE   Final   Sputum evaluation     Final   Value: THIS SPECIMEN IS ACCEPTABLE. RESPIRATORY CULTURE REPORT TO FOLLOW.   Report Status 12/14/2012 FINAL   Final  CULTURE, RESPIRATORY (NON-EXPECTORATED)     Status: None   Collection Time    12/14/12  2:18 PM      Result Value Range Status   Specimen Description SPUTUM   Final   Special Requests NONE   Final   Gram Stain     Final   Value: MODERATE WBC PRESENT,BOTH PMN AND MONONUCLEAR     MODERATE SQUAMOUS EPITHELIAL CELLS PRESENT     FEW GRAM POSITIVE COCCI     IN PAIRS     Performed at [facility]   Culture     Final   Value: NORMAL OROPHARYNGEAL FLORA     Performed at [facility]   Report Status 12/17/2012 FINAL   Final  URINE CULTURE     Status: None   Collection Time    12/14/12  2:58 PM       Result Value Range Status   Specimen Description URINE, RANDOM   Final   Special Requests NONE   Final   Culture  Setup Time     Final   Value: 12/14/2012 20:46     Performed at [facility] Count     Final   Value: NO GROWTH     Performed at [facility]   Culture     Final   Value: NO GROWTH     Performed at [facility]   Report Status 12/15/2012 FINAL   Final  BODY FLUID CULTURE     Status: None   Collection Time    12/15/12 11:35 AM      Result Value Range Status   Specimen Description PLEURAL   Final   Special Requests Normal   Final   Gram Stain     Final   Value: NO WBC SEEN     NO ORGANISMS SEEN     Performed at [facility]   Culture     Final   Value: NO GROWTH 3 DAYS     Performed at [facility]   Report Status 12/18/2012 FINAL   Final  CULTURE, BLOOD (ROUTINE X 2)     Status: None   Collection Time    12/16/12  7:05 PM      Result Value Range Status   Specimen Description BLOOD RIGHT ARM  5 ML IN [facility] BOTTLE   Final   Special Requests NONE   Final   Culture  Setup Time     Final   Value: 12/17/2012 06:18     Performed at [facility]   Culture     Final   Value:        BLOOD CULTURE RECEIVED NO GROWTH TO DATE CULTURE WILL BE HELD FOR 5 DAYS BEFORE ISSUING A FINAL NEGATIVE REPORT     Performed at [facility]   Report Status PENDING   Incomplete  CULTURE, BLOOD (ROUTINE X 2)     Status: None   Collection Time    12/16/12  7:09 PM      Result Value Range Status   Specimen Description BLOOD RIGHT HAND  5 ML IN [facility] BOTTLE   Final   Special  Requests NONE   Final   Culture  Setup Time     Final   Value: 12/17/2012 06:18     Performed at [facility]   Culture     Final   Value:        BLOOD CULTURE RECEIVED NO GROWTH TO DATE CULTURE WILL BE HELD FOR 5 DAYS BEFORE ISSUING A FINAL NEGATIVE REPORT     Performed at [facility]   Report Status PENDING   Incomplete  URINE CULTURE      Status: None   Collection Time    12/17/12  4:38 PM      Result Value Range Status   Specimen Description URINE, CATHETERIZED   Final   Special Requests NONE   Final   Culture  Setup Time     Final   Value: 12/18/2012 00:03     Performed at [facility] Count     Final   Value: NO GROWTH     Performed at [facility]   Culture     Final   Value: NO GROWTH     Performed at [facility]   Report Status 12/19/2012 FINAL   Final  URINE CULTURE     Status: None   Collection Time    12/19/12  6:11 AM      Result Value Range Status   Specimen Description URINE, CLEAN CATCH   Final   Special Requests NONE   Final   Culture  Setup Time     Final   Value: 12/19/2012 08:53     Performed at [facility]   Culture     Final   Value: 25,000 COLONIES/mL YEAST     Performed at [facility]   Report Status 12/20/2012 FINAL   Final     Studies: No results found.  Scheduled Meds: . aspirin EC  81 mg Oral Daily  . clonazePAM  1 mg Oral QHS  . docusate sodium  100 mg Oral BID  . doxepin  25 mg Oral QHS  . enoxaparin (LOVENOX) injection  40 mg Subcutaneous Q24H  . [START ON 12/21/2012] feeding supplement (ENSURE COMPLETE)  237 mL Oral Q24H  . guaiFENesin  1,200 mg Oral BID  . insulin aspart  0-9 Units Subcutaneous TID WC  . insulin glargine  25 Units Subcutaneous QHS  . levothyroxine  112 mcg Oral Q breakfast  . lipase/protease/amylase  1 capsule Oral TID AC  . metoprolol tartrate  12.5 mg Oral Q12H  . piperacillin-tazobactam (ZOSYN)  IV  3.375 g Intravenous Q8H  . sodium chloride  3 mL Intravenous Q12H  . vancomycin  500 mg Intravenous Q12H   Continuous Infusions: . sodium chloride 75 mL/hr at 12/20/12 1655    Principal Problem:   Acute respiratory distress Active Problems:   DM   HYPERLIPIDEMIA   HYPERTENSION   Malignant neoplasm of upper lobe, bronchus or lung   COPD (chronic obstructive pulmonary disease)   PNA  (pneumonia)   Acute exacerbation of CHF (congestive heart failure)   Hypokalemia   Pleural effusion on left   Pericardial effusion   Hypotension, unspecified   ARF (acute renal failure)    Time spent: 30 mins    Mikella Linsley  [facility] Pager [phone]. If 7PM-7AM, please contact night-coverage at [email or web address], password [facility] 12/20/2012, 6:25 PM  LOS: 6 days

## 2012-12-20 NOTE — Progress Notes (Signed)
SUBJECTIVE:  Lying flat and breathing comfortably.  She says her SOB is about the same as yesterday  OBJECTIVE:   Vitals:   Filed Vitals:   12/20/12 0750 12/20/12 0811 12/20/12 0900 12/20/12 0935  BP:   129/56 129/56  Pulse:   71 71  Temp:    98.6 F (37 C)  TempSrc:      Resp:      Height:      Weight:      SpO2: 85% 99%  100%   I&O's:    Intake/Output Summary (Last 24 hours) at 12/20/12 1637 Last data filed at 12/20/12 1215  Gross per 24 hour  Intake   1113 ml  Output    400 ml  Net    713 ml   TELEMETRY: Reviewed telemetry pt in NSR:     PHYSICAL EXAM General: Frail Head:   Normal cephalic and atramatic  Lungs:   No wheezing Heart:   HRRR S1 S2 ; no change in radial pulse with deep breathing Abdomen: Bowel sounds are positive, abdomen soft and non-tender without masses Extremities:   No edema.   Neuro: Alert Psych:  Good affect, responds appropriately   LABS: Basic Metabolic Panel:  Recent Labs  96/04/54 0515 12/18/12 1355 12/19/12 0435 12/20/12 0655  NA 128* 127* 129* 137  K 3.6 3.2* 3.5 3.3*  CL 86* 87* 89* 98  CO2 24 26 31 31   GLUCOSE 145* 207* 85 93  BUN 76* 70* 57* 27*  CREATININE 2.84* 2.20* 1.46* 0.81  CALCIUM 8.9 8.3* 9.0 8.6  PHOS  --  5.7*  --   --    Liver Function Tests:  Recent Labs  12/18/12 0515  AST 19  ALT 13  ALKPHOS 81  BILITOT 0.5  PROT 6.9  ALBUMIN 2.6*   No results found for this basename: LIPASE, AMYLASE,  in the last 72 hours CBC:  Recent Labs  12/18/12 0515 12/19/12 0435 12/20/12 0655  WBC 8.2 6.0 6.6  NEUTROABS 6.7  --   --   HGB 13.9 12.8 13.2  HCT 41.0 38.4 40.6  MCV 86.7 86.1 88.5  PLT 270 236 257   Coag Panel:   Lab Results  Component Value Date   INR 0.99 12/14/2012   INR 0.98 07/31/2012   INR 0.95 06/22/2012    RADIOLOGY: Dg Chest 2 View  12/14/2012   CLINICAL DATA:  Shortness of breath, cough, chest tightness  EXAM: CHEST  2 VIEW  COMPARISON:  The CT chest of 12/11/2012 and chest x-ray  of 05/17/2012  FINDINGS: As noted on recent CT, much of the opacity throughout the left hemi thorax is due to left effusion as well as known small cell carcinoma in the left lung. There is more opacity at the right lung base, and the considerations are that of superimposed pneumonia or edema. Cardiomegaly is stable. A right-sided palpable Port-A-Cath is present. Mild cardiomegaly is stable. [name]  IMPRESSION: 1. Volume loss throughout the left hemi thorax due to known left effusion and left lung carcinoma. 2. More opacity in the right mid lung and lung base may be due to pneumonia or edema. 3. Cardiomegaly.   Electronically Signed   By: Dwyane Dee M.D.   On: 12/14/2012 09:13   Dg Abd 1 View  12/17/2012   CLINICAL DATA:  Abdominal pain, nausea and vomiting.  EXAM: ABDOMEN - 1 VIEW  COMPARISON:  12/16/2012.  FINDINGS: Normal bowel gas pattern. Lumbar spine degenerative changes. Atheromatous arterial calcifications.  Cholecystectomy clips.  IMPRESSION: No acute abnormality.   Electronically Signed   By: Gordan Payment M.D.   On: 12/17/2012 17:03   Ct Chest W Contrast  12/11/2012   CLINICAL DATA:  Small cell lung cancer. Cough. Previous radiation therapy and chemotherapy. Nausea and vomiting.  EXAM: CT CHEST, ABDOMEN, AND PELVIS WITH CONTRAST  TECHNIQUE: Multidetector CT imaging of the chest, abdomen and pelvis was performed following the standard protocol during bolus administration of intravenous contrast.  CONTRAST:  OMNIPAQUE IOHEXOL 300 MG/ML  SOLN  COMPARISON:  09/27/2012.  FINDINGS: CT CHEST FINDINGS  No pathologically enlarged mediastinal lymph nodes. Bi hilar lymphoid tissue. No axillary adenopathy. Atherosclerotic calcification of the arterial vasculature, including extensive involvement of the coronary arteries. Ascending aorta measures 4 cm. Incidental note is made of left vertebral artery origin from the proximal left subclavian artery or aortic arch. Pulmonary arteries are enlarged. Heart is enlarged.  No pericardial effusion.  Moderate left pleural effusion, new. No definite pleural nodularity. Progressive consolidative change in the left upper lobe, including the lingular portion. Compressive atelectasis in the left lower lobe. Scarring and volume loss in the right hemi thorax. A few scattered tiny nodular densities in the right lung are likely unchanged. Airway is otherwise unremarkable.  CT ABDOMEN AND PELVIS FINDINGS  Minimal intrahepatic and extrahepatic biliary duct dilatation, unchanged. Liver is otherwise unremarkable. Cholecystectomy.  Adrenal nodules have enlarged, measuring 2.2 x 1.9 cm on the right (previously 1.1 x 1.4 cm) and 2.0 x 2.7 cm on the left (previously 1.0 x 1.6 cm).  Renal vascular calcifications bilaterally. Sub cm low-attenuation lesions in the right kidney are too small to characterize but stable. Possible left renal stones, no obstruction. Spleen, pancreas, stomach and bowel are unremarkable. Incidental note is made of a duodenal diverticulum.  Atherosclerotic calcification of the arterial vasculature without abdominal aortic aneurysm. Hysterectomy. No pathologically enlarged lymph nodes. No free fluid. There is focal dilatation of the left gonadal vein, anterior to the left psoas muscle (series 2, image 88), simulating a lymph node. Finding is unchanged. Small bowel mesenteric lymph nodes measure up to 8 mm in short axis, more prominent on the prior exam but not enlarged by CT size criteria.  A small rounded sclerotic lesion in the right iliac wing (image 90) is unchanged. Areas of sclerosis in the iliac wings, adjacent to the sacroiliac joints, also appear stable. Degenerative changes are seen throughout the spine. Possible old fracture involving the left 5th anterolateral rib. Mild T6 compression fracture is unchanged.  IMPRESSION: 1. Interval progression of small cell lung cancer, as evidenced by increasing consolidation in the left upper lobe, new moderate left pleural effusion  and enlarging bilateral adrenal metastases. 2. Areas of sclerosis in the iliac wings appear stable. 3. Borderline ascending aortic aneurysm. Enlarged pulmonary arteries are indicative of pulmonary arterial hypertension. Extensive coronary artery calcifications. 4. Possible left renal stones. 5. Increase in prominence of mesenteric lymph nodes, not pathologically enlarged by CT size criteria, nonspecific. Continued attention on followup exams is warranted.   Electronically Signed   By: Leanna Battles M.D.   On: 12/11/2012 10:49   Ct Abdomen Pelvis W Contrast  12/11/2012   CLINICAL DATA:  Small cell lung cancer. Cough. Previous radiation therapy and chemotherapy. Nausea and vomiting.  EXAM: CT CHEST, ABDOMEN, AND PELVIS WITH CONTRAST  TECHNIQUE: Multidetector CT imaging of the chest, abdomen and pelvis was performed following the standard protocol during bolus administration of intravenous contrast.  CONTRAST:  OMNIPAQUE IOHEXOL  300 MG/ML  SOLN  COMPARISON:  09/27/2012.  FINDINGS: CT CHEST FINDINGS  No pathologically enlarged mediastinal lymph nodes. Bi hilar lymphoid tissue. No axillary adenopathy. Atherosclerotic calcification of the arterial vasculature, including extensive involvement of the coronary arteries. Ascending aorta measures 4 cm. Incidental note is made of left vertebral artery origin from the proximal left subclavian artery or aortic arch. Pulmonary arteries are enlarged. Heart is enlarged. No pericardial effusion.  Moderate left pleural effusion, new. No definite pleural nodularity. Progressive consolidative change in the left upper lobe, including the lingular portion. Compressive atelectasis in the left lower lobe. Scarring and volume loss in the right hemi thorax. A few scattered tiny nodular densities in the right lung are likely unchanged. Airway is otherwise unremarkable.  CT ABDOMEN AND PELVIS FINDINGS  Minimal intrahepatic and extrahepatic biliary duct dilatation, unchanged. Liver is  otherwise unremarkable. Cholecystectomy.  Adrenal nodules have enlarged, measuring 2.2 x 1.9 cm on the right (previously 1.1 x 1.4 cm) and 2.0 x 2.7 cm on the left (previously 1.0 x 1.6 cm).  Renal vascular calcifications bilaterally. Sub cm low-attenuation lesions in the right kidney are too small to characterize but stable. Possible left renal stones, no obstruction. Spleen, pancreas, stomach and bowel are unremarkable. Incidental note is made of a duodenal diverticulum.  Atherosclerotic calcification of the arterial vasculature without abdominal aortic aneurysm. Hysterectomy. No pathologically enlarged lymph nodes. No free fluid. There is focal dilatation of the left gonadal vein, anterior to the left psoas muscle (series 2, image 88), simulating a lymph node. Finding is unchanged. Small bowel mesenteric lymph nodes measure up to 8 mm in short axis, more prominent on the prior exam but not enlarged by CT size criteria.  A small rounded sclerotic lesion in the right iliac wing (image 90) is unchanged. Areas of sclerosis in the iliac wings, adjacent to the sacroiliac joints, also appear stable. Degenerative changes are seen throughout the spine. Possible old fracture involving the left 5th anterolateral rib. Mild T6 compression fracture is unchanged.  IMPRESSION: 1. Interval progression of small cell lung cancer, as evidenced by increasing consolidation in the left upper lobe, new moderate left pleural effusion and enlarging bilateral adrenal metastases. 2. Areas of sclerosis in the iliac wings appear stable. 3. Borderline ascending aortic aneurysm. Enlarged pulmonary arteries are indicative of pulmonary arterial hypertension. Extensive coronary artery calcifications. 4. Possible left renal stones. 5. Increase in prominence of mesenteric lymph nodes, not pathologically enlarged by CT size criteria, nonspecific. Continued attention on followup exams is warranted.   Electronically Signed   By: Leanna Battles M.D.    On: 12/11/2012 10:49   US Renal  12/18/2012   CLINICAL DATA:  Acute renal failure  EXAM: RENAL/URINARY TRACT ULTRASOUND COMPLETE  COMPARISON:  12/11/2012  FINDINGS: Right Kidney  Length: 13.5 cm. Echogenicity within normal limits. No mass or hydronephrosis visualized.  Left Kidney  Length: Measures 13.6 cm spot. Echogenicity within normal limits. No mass or hydronephrosis visualized. 5 mm nonobstructing calculus is identified within the inferior pole  Bladder  Appears normal for degree of bladder distention.  IMPRESSION: 1. No evidence for obstructive uropathy.  2. Left renal calculi   Electronically Signed   By: Signa Kell M.D.   On: 12/18/2012 14:41   Dg Chest Port 1 View  12/16/2012   CLINICAL DATA:  Lethargy  EXAM: PORTABLE CHEST - 1 VIEW  COMPARISON:  11/8/ 14  FINDINGS: Cardiomediastinal silhouette is stable. Right Port-A-Cath is unchanged in position. Persistent patchy airspace disease left  upper lobe and bilateral lower lobes suspicious for multifocal pneumonia.  IMPRESSION: Persistent patchy airspace disease left upper lobe and bilateral lower lobe suspicious for pneumonia. Stable right Port-A-Cath position.   Electronically Signed   By: Natasha Mead M.D.   On: 12/16/2012 18:17   Dg Chest Port 1 View  12/16/2012   CLINICAL DATA:  Cough, shortness of breath, follow-up pleural effusion  EXAM: PORTABLE CHEST - 1 VIEW  COMPARISON:  12/15/2012  FINDINGS: Patchy left upper lobe opacity, suspicious for pneumonia. Additional patchy bilateral lower lobe opacities, atelectasis versus pneumonia.  Left pleural effusion is not well visualized on the current study, likely improved. No pneumothorax.  Cardiomegaly.  Stable right chest port.  IMPRESSION: Patchy left upper lobe opacity, suspicious for pneumonia.  Additional patchy bilateral lower lobe opacities, atelectasis versus pneumonia.  Left pleural effusion is not well visualized and likely improved.   Electronically Signed   By: Charline Bills M.D.    On: 12/16/2012 10:49   Dg Chest Port 1 View  12/15/2012   CLINICAL DATA:  Status post left thoracentesis.  EXAM: PORTABLE CHEST - 1 VIEW  COMPARISON:  Multiple priors  FINDINGS: Right Port-A-Cath is unchanged in position. Cardiomediastinal silhouette is unchanged. Interval decrease in left pleural effusion following thoracentesis. Opacity is again identified at the left lung apex and left base. Mild right basilar opacity is noted. There is prominence of the interstitial markings.  IMPRESSION: 1. Improvement in left lung aeration following thoracentesis with residual left basilar opacity. No pneumothorax.  2.  Mild right basilar opacity, likely atelectasis.  3.  Continued left upper lobe opacity.  4.  Pulmonary vascular congestion.   Electronically Signed   By: Jerene Dilling M.D.   On: 12/15/2012 12:01   Dg Chest Port 1 View  12/15/2012   CLINICAL DATA:  Shortness of breath, effusion  EXAM: PORTABLE CHEST - 1 VIEW  COMPARISON:  Prior radiograph from 12/14/2012  FINDINGS: Right-sided Port-A-Cath is stable in position. Cardiomegaly is unchanged.  There has been interval worsening of a left-sided effusion with decreased aeration of the left hemi thorax as compared to the most recent examination. Pleural fluid is now seen capping the left lung apex. The known small cell lung cancer in the left lung is likely unchanged. The right hemi thorax is grossly clear. No pneumothorax.  Osseous structures are unchanged.  IMPRESSION: Interval worsening of left pleural effusion with decreased aeration of the left lung as compared to 12/14/12.   Electronically Signed   By: Rise Mu M.D.   On: 12/15/2012 05:54   Dg Abd Portable 1v  12/16/2012   CLINICAL DATA:  Abdominal pain and emesis  EXAM: PORTABLE ABDOMEN - 1 VIEW  COMPARISON:  None.  FINDINGS: There are no abnormally dilated loops of bowel. There is stool distending the rectum somewhat. Cholecystectomy clips are noted in the right upper quadrant.   IMPRESSION: Nonobstructive gas pattern. Mild distal fecal retention.   Electronically Signed   By: Esperanza Heir M.D.   On: 12/16/2012 18:19    ASSESSMENT AND PLAN  79 year old female with small cell lung cancer  1. Pericardial effusion - mostly posterior, LV EF 50 %, no signs of tamponade on the echocardiogram on 12/14/12 and repeat yesterday showed decrease in size.  The effusion is most probably malignant. 2. Pleural effusion, lung edema - s/p thoracentesis, improved SOB with significant diuresis..  Diuretics on hold due to AKI 3. AKI - improved after holding Lasix. Cr back to baseline.  4.  Hypotension - resolved after stopping diuretics.  Most likely from volume contraction.  Would hold diuretics one more day. No obvious SHOB.    Corky Crafts., MD  12/20/2012  4:37 PM

## 2012-12-20 NOTE — Progress Notes (Signed)
This is a courtesy visit note. The patient is seen earlier today. She continues to complain of increasing shortness of breath. She was found recently to have evidence for disease progression of the small cell lung cancer. Her shortness of breath is multifactorial including her recent evidence for disease progression of the small cell lung cancer in addition to acute congestive heart failure exacerbation and large left pleural effusion in addition to questionable pneumonia. She had left-sided thoracentesis and the final cytology showed reactive mesothelial cells. She was supposed to start systemic chemotherapy earlier this week.  I will continue to hold her chemotherapy for now until improvement of her condition. No family member at the bedside and I asked the patient to make sure that her son or daughter will be available at the time of rounds tomorrow morning to discuss her current condition and prognosis with him.

## 2012-12-20 NOTE — Progress Notes (Signed)
NUTRITION FOLLOW UP  Intervention:   Provide Ensure Complete once daily Encourage PO intake Provide a snack once daily  Nutrition Dx:   Inadequate oral intake related to varied appetite as evidenced by 4% wt loss in less than 1 month; ongoing  Goal:   Pt to meet >/= 90% of their estimated nutrition needs; not met  Monitor:   PO intake; 15-75% of meals Weight; decreased 1 lb since admission Labs; Blood glucose ranging 49-207, low potassium, low GFR (trending up)  Assessment:   79 y.o. female with medical history of hypertension, diabetes, hypothyroidism, extensive status stage small cell lung cancer diagnosed in May of 2014 status post chemotherapy in June of 2014 with 5 cycles last cycle 10/04/2012, status post palliative radiotherapy to the left lung field completed 08/14/2012 who presents to the ED with a several hour history of worsening shortness of breath which started at night, orthopnea, wheezing, gasping for breath.   Per nursing note pt is eating 15-75% of meals, mostly 25-50%. Pt reports that her appetite hasn't been good. She is interested in receiving Ensure supplements and chocolate cake. Pt's weight has decreased 1 lb since admission.   Height: Ht Readings from Last 1 Encounters:  12/14/12 5\' 4"  (1.626 m)    Weight Status:   Wt Readings from Last 1 Encounters:  12/20/12 144 lb 13.5 oz (65.7 kg)    Re-estimated needs:  Kcal: 1600-1800  Protein: 95-105 grams  Fluid: 1.6-1.8 L/day  Skin: WDL  Diet Order: Cardiac   Intake/Output Summary (Last 24 hours) at 12/20/12 1619 Last data filed at 12/20/12 1215  Gross per 24 hour  Intake   1113 ml  Output    400 ml  Net    713 ml    Last BM: 11/11   Labs:   Recent Labs Lab 12/14/12 1510  12/18/12 0515 12/18/12 1355 12/19/12 0435 12/20/12 0655  NA  --   < > 128* 127* 129* 137  K  --   < > 3.6 3.2* 3.5 3.3*  CL  --   < > 86* 87* 89* 98  CO2  --   < > 24 26 31 31   BUN  --   < > 76* 70* 57* 27*   CREATININE 0.58  < > 2.84* 2.20* 1.46* 0.81  CALCIUM  --   < > 8.9 8.3* 9.0 8.6  MG 1.9  --   --   --   --   --   PHOS  --   --   --  5.7*  --   --   GLUCOSE  --   < > 145* 207* 85 93  < > = values in this interval not displayed.  CBG (last 3)   Recent Labs  12/20/12 0633 12/20/12 0730 12/20/12 1208  GLUCAP 77 89 156*    Scheduled Meds: . aspirin EC  81 mg Oral Daily  . clonazePAM  1 mg Oral QHS  . docusate sodium  100 mg Oral BID  . doxepin  25 mg Oral QHS  . enoxaparin (LOVENOX) injection  40 mg Subcutaneous Q24H  . guaiFENesin  1,200 mg Oral BID  . insulin aspart  0-9 Units Subcutaneous TID WC  . insulin glargine  25 Units Subcutaneous QHS  . levothyroxine  112 mcg Oral Q breakfast  . lipase/protease/amylase  1 capsule Oral TID AC  . metoprolol tartrate  12.5 mg Oral Q12H  . piperacillin-tazobactam (ZOSYN)  IV  3.375 g Intravenous Q8H  .  sodium chloride  3 mL Intravenous Q12H  . vancomycin  500 mg Intravenous Q12H    Continuous Infusions: . sodium chloride 75 mL/hr at 12/19/12 2216    Ian Malkin RD, LDN Inpatient Clinical Dietitian Pager: [phone] After Hours Pager: [phone]

## 2012-12-20 NOTE — Progress Notes (Signed)
It was reported during shift change that patient had hypoglycemic event this morning with a blood glucose reading of 49. After interventions glucose levels rose to 77, subsequently patients breakfast tray arrived and patient was fed.  Patients oxygen saturation levels were also dipping in mid 80's. I placed patient back on 6L nasal canula and continued to monitor patient on continuous pulse.  Called respiratory for breathing treatment. Patient currently sitting up without any sob or any exertion of breathing. Will continue to monitor patient. J.Cal Gindlesperger, RN

## 2012-12-20 NOTE — Progress Notes (Signed)
Admit: 12/14/2012 LOS: 6  53F with extensive small cell lung ca with AKI related to volume contraction, contrast exposure, ACEi.    Subjective:  SCr normalized Hypoglycemic overnight Pt feels washed out today.  Appears fatigued.    11/11 0701 - 11/12 0700 In: 1443 [P.O.:840; I.V.:503; IV Piggyback:100] Out: 300 [Urine:300]  Filed Weights   12/18/12 0510 12/19/12 0555 12/20/12 0620  Weight: 63.9 kg (140 lb 14 oz) 66.2 kg (145 lb 15.1 oz) 65.7 kg (144 lb 13.5 oz)    Current meds: reviewed Current Labs: reviewed    Physical Exam:  Blood pressure 129/56, pulse 71, temperature 98.3 F (36.8 C), temperature source Oral, resp. rate 16, height 5\' 4"  (1.626 m), weight 65.7 kg (144 lb 13.5 oz), SpO2 99.00%. GEN: Ill appearing elderly female; groggy and clearly tired  ENT: NCAT.  EYES: EOMI, anicteric  CV: RRR, nl s1s2  PULM: faint BS in L lung base. Diminished bs bilaterally  ABD: some suprapubic tenderness.  SKIN: PORT site not accessed. No rashes.  EXT:no LEE  NEURO: mae x4  Assessment/Plan 1. AKI: resolved and likely 2/2 volume contractiion, contrast, ATN.  No evidence of obstruction. Nonoliguric.  I would cont to avoid nephrotoxins as able.   Will sign off for now.  Please page with any questions or concerns.    Sabra Heck MD 12/20/2012, 11:25 AM   Recent Labs Lab 12/18/12 0515 12/18/12 1355 12/19/12 0435 12/20/12 0655  NA 128* 127* 129* 137  K 3.6 3.2* 3.5 3.3*  CL 86* 87* 89* 98  CO2 24 26 31 31   GLUCOSE 145* 207* 85 93  BUN 76* 70* 57* 27*  CREATININE 2.84* 2.20* 1.46* 0.81  CALCIUM 8.9 8.3* 9.0 8.6  PHOS  --  5.7*  --   --     Recent Labs Lab 12/16/12 0530 12/16/12 1905  12/18/12 0515 12/19/12 0435 12/20/12 0655  WBC 11.6* 10.5  < > 8.2 6.0 6.6  NEUTROABS 10.4* 9.0*  --  6.7  --   --   HGB 14.4 14.8  < > 13.9 12.8 13.2  HCT 43.2 44.2  < > 41.0 38.4 40.6  MCV 88.0 87.0  < > 86.7 86.1 88.5  PLT 258 243  < > 270 236 257  < > = values in this  interval not displayed.  Current Facility-Administered Medications  Medication Dose Route Frequency Provider Last Rate Last Dose  . 0.9 %  sodium chloride infusion  250 mL Intravenous PRN Rodolph Bong, MD      . 0.9 %  sodium chloride infusion   Intravenous Continuous Rodolph Bong, MD 75 mL/hr at 12/19/12 2216    . acetaminophen (TYLENOL) tablet 650 mg  650 mg Oral Q4H PRN Rodolph Bong, MD   650 mg at 12/18/12 1702  . albuterol (PROVENTIL) (5 MG/ML) 0.5% nebulizer solution 2.5 mg  2.5 mg Nebulization Q4H PRN Rodolph Bong, MD   2.5 mg at 12/20/12 0813  . aspirin EC tablet 81 mg  81 mg Oral Daily Rodolph Bong, MD   81 mg at 12/20/12 1033  . clonazePAM (KLONOPIN) tablet 1 mg  1 mg Oral QHS Rodolph Bong, MD   1 mg at 12/19/12 2215  . dextrose (GLUTOSE) 40 % oral gel 37.5 g  1 Tube Oral PRN Rodolph Bong, MD      . dextrose 50 % solution 50 mL  50 mL Intravenous Once PRN Rodolph Bong, MD      .  docusate sodium (COLACE) capsule 100 mg  100 mg Oral BID Rodolph Bong, MD   100 mg at 12/20/12 1033  . doxepin (SINEQUAN) capsule 25 mg  25 mg Oral QHS Rodolph Bong, MD   25 mg at 12/19/12 2216  . enoxaparin (LOVENOX) injection 30 mg  30 mg Subcutaneous Q24H Rodolph Bong, MD   30 mg at 12/19/12 2216  . feeding supplement (GLUCERNA SHAKE) (GLUCERNA SHAKE) liquid 237 mL  237 mL Oral Daily PRN Lorraine Lax, RD      . glucose-Vitamin C 4-0.006 GM per chewable tablet 4 tablet  4 tablet Oral PRN Rodolph Bong, MD      . guaiFENesin [facility]) 12 hr tablet 1,200 mg  1,200 mg Oral BID Rodolph Bong, MD   1,200 mg at 12/20/12 1033  . insulin aspart (novoLOG) injection 0-9 Units  0-9 Units Subcutaneous TID WC Rodolph Bong, MD      . insulin glargine (LANTUS) injection 25 Units  25 Units Subcutaneous QHS Rodolph Bong, MD   25 Units at 12/19/12 2216  . ipratropium (ATROVENT) nebulizer solution 0.5 mg  0.5 mg Nebulization Q4H PRN Rodolph Bong,  MD   0.5 mg at 12/20/12 4540  . levothyroxine (SYNTHROID, LEVOTHROID) tablet 112 mcg  112 mcg Oral Q breakfast Rodolph Bong, MD   112 mcg at 12/20/12 0755  . lipase/protease/amylase (CREON-12/PANCREASE) capsule 1 capsule  1 capsule Oral TID AC Rodolph Bong, MD   1 capsule at 12/20/12 0755  . metoprolol tartrate (LOPRESSOR) tablet 12.5 mg  12.5 mg Oral Q12H Rodolph Bong, MD   12.5 mg at 12/20/12 1033  . ondansetron (ZOFRAN) injection 4 mg  4 mg Intravenous Q6H PRN Rodolph Bong, MD      . ondansetron [facility]) injection 4 mg  4 mg Intravenous Q6H PRN Rodolph Bong, MD   4 mg at 12/17/12 1732  . piperacillin-tazobactam (ZOSYN) IVPB 3.375 g  3.375 g Intravenous Q8H Christine E Shade, RPH   3.375 g at 12/20/12 0609  . prochlorperazine (COMPAZINE) tablet 10 mg  10 mg Oral Q6H PRN Rodolph Bong, MD      . sodium chloride 0.9 % injection 3 mL  3 mL Intravenous Q12H Rodolph Bong, MD   3 mL at 12/19/12 2217  . sodium chloride 0.9 % injection 3 mL  3 mL Intravenous PRN Rodolph Bong, MD      . vancomycin (VANCOCIN) IVPB 750 mg/150 ml premix  750 mg Intravenous Q24H Winfield Rast, RPH   750 mg at 12/20/12 [phone]

## 2012-12-20 NOTE — Progress Notes (Addendum)
ANTIBIOTIC CONSULT NOTE - Follow-up  Pharmacy Consult for Vancomycin, Zosyn Indication: PNA  Allergies  Allergen Reactions  . Sulfa Antibiotics Rash    Patient Measurements: Height: 5\' 4"  (162.6 cm) Weight: 144 lb 13.5 oz (65.7 kg) IBW/kg (Calculated) : 54.7  Vital Signs: BP: 129/56 mmHg (11/12 0900) Pulse Rate: 71 (11/12 0900) Intake/Output from previous day: 11/11 0701 - 11/12 0700 In: 1443 [P.O.:840; I.V.:503; IV Piggyback:100] Out: 300 [Urine:300]  Labs:  Recent Labs  12/17/12 1638  12/18/12 0515 12/18/12 1355 12/19/12 0435 12/20/12 0655  WBC  --   --  8.2  --  6.0 6.6  HGB  --   --  13.9  --  12.8 13.2  PLT  --   --  270  --  236 257  LABCREA 123.6  --   --   --   --   --   CREATININE  --   < > 2.84* 2.20* 1.46* 0.81  < > = values in this interval not displayed. Estimated Creatinine Clearance: 52.5 ml/min (by C-G formula based on Cr of 0.81).   Microbiology: 11/11 Urine:  sent 11/9 Urine: NGF 11/8: S pneumo/[place] Ur Ag: negative 11/8 blood x2: ngtd 11/7 pleural fluid: NGF 11/6 Sputum: nml oropharyngeal flora 11/6 urine: NGF  Antiinfectives; 11/6 >> azithromycin x 1 11/6 >> ceftriaxone x 1 11/8 >> Zosyn >> 11/8 >> vancomycin >>   Assessment: 49 yoF with PMHx HTN, DM, hx pancreatitis, hypothyroidism, COPD, CHF (EF 50-55%) and recent diagnosis of SCLCa s/p radiation and 5 cycles carboplatin/etoposide, planned chemotherapy cisplatin/irinotecan 11/10 for disease progression, presents to [facility] with SOB, pleural effusion and moderate pericardial effusion.   Pt s/p thoracentesis 11/7 with 1700cc fluid removed. 11/8 pt with emesis and lethary and pharmacy consulted to dose vancomycin and zosyn for possible PNA.  Allergy to sulfa antibiotics noted.   Tmax: afeb WBCs: 6.6 (last steroid 11/6) Renal: SCr significantly improved, 2.2 > 1.46 > 0.81 with CrCl ~ 58 mL/min (CG), 63 mL/min (N)  Vancomycin level subtherapeutic (7.3) and improvement in renal  function noted.  Patient last received vancomycin 750 mg today at 0800   Goal of Therapy:  Vancomycin trough level 15-20 mcg/ml  Plan:   Continue Zosyn 3.375g IV Q8H infused over 4hrs.   Change to Vancomycin 500mg  IV q12h, administer next dose today approximately12 hours from last given dose  Follow serum creatinine  Recheck vancomycin trough if clinically indicated.  Thank you for the consult.  Morrell Riddle, PharmD Candidate  12/20/2012 10:34 AM   I have reviewed the above note and concur with the findings, assessment, and plans.  Elie Goody, PharmD, BCPS Pager: [phone] 12/20/2012  11:44 AM

## 2012-12-21 DIAGNOSIS — J449 Chronic obstructive pulmonary disease, unspecified: Secondary | ICD-10-CM

## 2012-12-21 DIAGNOSIS — R627 Adult failure to thrive: Secondary | ICD-10-CM

## 2012-12-21 LAB — GLUCOSE, CAPILLARY
Glucose-Capillary: 98 mg/dL (ref 70–99)
Glucose-Capillary: 108 mg/dL — ABNORMAL HIGH (ref 70–99)

## 2012-12-21 LAB — BASIC METABOLIC PANEL
BUN: 12 mg/dL (ref 6–23)
CO2: 27 mEq/L (ref 19–32)
Calcium: 8.3 mg/dL — ABNORMAL LOW (ref 8.4–10.5)
Chloride: 102 mEq/L (ref 96–112)
GFR calc Af Amer: >90 mL/min (ref >90)
Glucose, Bld: 49 mg/dL — ABNORMAL LOW (ref 70–99)
Potassium: 3.6 mEq/L (ref 3.5–5.1)

## 2012-12-21 LAB — CREATININE, SERUM: Creatinine, Ser: 0.59 mg/dL (ref 0.50–1.10)

## 2012-12-21 MED ORDER — PIPERACILLIN-TAZOBACTAM 3.375 G IVPB: 3.3750 g | Freq: Three times a day (TID) | INTRAVENOUS | Status: DC

## 2012-12-21 MED ORDER — PIPERACILLIN-TAZOBACTAM 3.375 G IVPB: 3.3750 g | Freq: Three times a day (TID) | INTRAVENOUS | Status: AC

## 2012-12-21 MED ORDER — VANCOMYCIN HCL 500 MG IV SOLR: 500.0000 mg | Freq: Two times a day (BID) | INTRAVENOUS | Status: DC

## 2012-12-21 MED ORDER — ALBUTEROL SULFATE (5 MG/ML) 0.5% IN NEBU: 2.5000 mg | INHALATION_SOLUTION | RESPIRATORY_TRACT | Status: AC | PRN

## 2012-12-21 MED ORDER — ENSURE COMPLETE PO LIQD: 237.0000 mL | ORAL | Status: AC

## 2012-12-21 MED ORDER — DSS 100 MG PO CAPS: 100.0000 mg | ORAL_CAPSULE | Freq: Two times a day (BID) | ORAL | Status: AC

## 2012-12-21 MED ORDER — GLUCERNA SHAKE PO LIQD: 237.0000 mL | Freq: Every day | ORAL | Status: AC | PRN

## 2012-12-21 MED ORDER — METOPROLOL TARTRATE 12.5 MG HALF TABLET: 12.5000 mg | ORAL_TABLET | Freq: Two times a day (BID) | ORAL | Status: AC

## 2012-12-21 MED ORDER — IPRATROPIUM BROMIDE 0.02 % IN SOLN: 0.5000 mg | RESPIRATORY_TRACT | Status: AC | PRN

## 2012-12-21 MED ORDER — INSULIN ASPART 100 UNIT/ML ~~LOC~~ SOLN: 0.0000 [IU] | SUBCUTANEOUS | Status: AC

## 2012-12-21 MED ORDER — INSULIN GLARGINE 100 UNIT/ML ~~LOC~~ SOLN: 7.0000 [IU] | Freq: Every day | SUBCUTANEOUS | Status: DC

## 2012-12-21 MED ORDER — VANCOMYCIN HCL 500 MG IV SOLR: 500.0000 mg | Freq: Two times a day (BID) | INTRAVENOUS | Status: AC

## 2012-12-21 NOTE — Progress Notes (Signed)
Palliative Medicine Team consult request for goals of care, hospice option discussion received from Dr Arbutus Ped; spoke with patient at bedside, she informed she lives with her son Amey Hossain however she requests writer contact her son Julien Nordmann to schedule meeting and he can contact siblings; contacted numbers listed  For Julien Nordmann c: [phone] and h: [phone]; left messages and await a call back to schedule meeting time   Valente David, RN 12/21/2012, 12:12 PM Palliative Medicine Team RN Liaison [phone]

## 2012-12-21 NOTE — Progress Notes (Signed)
Spoke with pt concerning LTAC and she agreed with going to [facility]. Pt asked that we call her children; son Peyton Najjar was called at home [phone], on cell [phone], VM left on both. Pt's daughter Bonita Quin [phone] was called and VM was left on her cell.

## 2012-12-21 NOTE — Evaluation (Signed)
Physical Therapy Evaluation Patient Details Name: Misty Ball MRN: [id number] DOB: 11-14-1931 Today's Date: 12/21/2012 Time: 1478-2956 PT Time Calculation (min): 12 min  PT Assessment / Plan / Recommendation History of Present Illness  Pt is a 79 y/o female with medical history of hypertension, diabetes, hypothyroidism, extensive status stage small cell lung cancer diagnosed in May of 2014 status post chemotherapy in June of 2014 with 5 cycles last cycle 10/04/2012, status post palliative radiotherapy to the left lung field completed 08/14/2012 who presents to the ED with a several hour history of worsening shortness of breath which started at night, orthopnea, wheezing, gasping for breath. Patient does endorse weight gain states she usually weighs 151 pounds. Patient also endorses some diaphoresis, chills, generalized weakness, fatigue. Patient denies any fever, no nausea, no vomiting, no diarrhea, no dysuria, no chest pain. Patient does endorse a two-week history of a productive cough of whitish sputum  Clinical Impression  Pt admitted for acute respiratory failure with history of lung CA. Pt currently presenting with functional limitations due to deficits listed below (see PT Problem List). Pt would benefit from skilled PT to increase independence and safety during mobility and to allow d/c to venue listed below. Pt able to perform stand pivot transfer from bed to [facility] and back with min guard, pt did not wish to attempt ambulation due to fatigue. Pt reports she lives with son who is able to provide assist 24/7, need to confirm this with son. If son unable to provide assist at home, PT recommends SNF to improve endurance, strength, and safety.    PT Assessment  Patient needs continued PT services    Follow Up Recommendations  Home health PT;Supervision for mobility/OOB    Does the patient have the potential to tolerate intense rehabilitation      Barriers to Discharge        Equipment  Recommendations  None recommended by PT    Recommendations for Other Services     Frequency Min 3X/week    Precautions / Restrictions Precautions Precautions: Fall Restrictions Weight Bearing Restrictions: No   Pertinent Vitals/Pain No c/o pain during session. SaO2 on 5.5L of O2 [place] 98% during session and no c/o SOB. Pt positioned to comfort at end of session.      Mobility  Bed Mobility Bed Mobility: Supine to Sit;Sitting - Scoot to [place] of Bed;Sit to Supine Supine to Sit: 4: Min assist Sitting - Scoot to Edge of Bed: 4: Min guard Sit to Supine: 4: Min assist Details for Bed Mobility Assistance: assist during supine<>sit due to weakness and fatigue. VC's for hand placement. Transfers Transfers: Sit to Stand;Stand to [employer] Transfers Sit to Stand: 4: Min assist;From bed;With upper extremity assist;From chair/3-in-1;With armrests Stand to Sit: 4: Min guard;With upper extremity assist;With armrests;To bed;To chair/3-in-1 Stand Pivot Transfers: 4: Min guard Details for Transfer Assistance: assist to rise and steady during transfers and min guard to ensure safety during descent. Ambulation/Gait Ambulation/Gait Assistance: Not tested (comment) Ambulation/Gait Assistance Details: pt reports she is too fatigued this morning to attempt ambulation    Exercises     PT Diagnosis: Difficulty walking;Generalized weakness  PT Problem List: Decreased strength;Decreased activity tolerance;Decreased mobility;Decreased knowledge of use of DME PT Treatment Interventions: DME instruction;Gait training;Stair training;Functional mobility training;Therapeutic activities;Therapeutic exercise;Patient/family education     PT Goals(Current goals can be found in the care plan section) Acute Rehab PT Goals Patient Stated Goal: none specified PT Goal Formulation: With patient Time For Goal Achievement: 01/04/13 Potential  to Achieve Goals: Good  Visit Information  Last PT Received On:  12/21/12 Assistance Needed: +1 History of Present Illness: Pt is a 79 y/o female with medical history of hypertension, diabetes, hypothyroidism, extensive status stage small cell lung cancer diagnosed in May of 2014 status post chemotherapy in June of 2014 with 5 cycles last cycle 10/04/2012, status post palliative radiotherapy to the left lung field completed 08/14/2012 who presents to the ED with a several hour history of worsening shortness of breath which started at night, orthopnea, wheezing, gasping for breath. Patient does endorse weight gain states she usually weighs 151 pounds. Patient also endorses some diaphoresis, chills, generalized weakness, fatigue. Patient denies any fever, no nausea, no vomiting, no diarrhea, no dysuria, no chest pain. Patient does endorse a two-week history of a productive cough of whitish sputum       Prior Functioning  Home Living Family/patient expects to be discharged to:: Private residence Living Arrangements: Children (son) Available Help at Discharge: Family;Available 24 hours/day Type of Home: House Home Access: Stairs to enter [employer] of Steps: 3 Entrance Stairs-Rails: None Home Layout: One level Home Equipment: Walker - 2 wheels;Cane - quad;Bedside commode Prior Function Level of Independence: Independent with assistive device(s) Comments: pt reports she utilized [facility] during ambulation prior to hospitalization and was ind. in all ADLs, pt does not have shower seat/chair. Communication Communication: No difficulties    Cognition  Cognition Arousal/Alertness: Awake/alert Behavior During Therapy: WFL for tasks assessed/performed Overall Cognitive Status: Within Functional Limits for tasks assessed    Extremity/Trunk Assessment Lower Extremity Assessment Lower Extremity Assessment: Generalized weakness   Balance    End of Session PT - End of Session Equipment Utilized During Treatment: Oxygen Activity Tolerance: Patient limited  by fatigue Patient left: in bed;with call bell/phone within reach;with bed alarm set  GP     Sol Blazing 12/21/2012, 10:03 AM

## 2012-12-21 NOTE — Evaluation (Signed)
I have reviewed this note and agree with all findings. Kati Beren Yniguez, PT, DPT Pager: [phone]   

## 2012-12-21 NOTE — Progress Notes (Signed)
Spoke with pt's son Peyton Najjar at work [phone] ext [phone], concerning pt going to [facility]. Peyton Najjar was ok with that. I had also spoken to daughter Bonita Quin who was also ok with pt going to [facility]. Spoke with Dr. Blake Divine,  MD at [facility] will also follow for Palliative Care.

## 2012-12-21 NOTE — Progress Notes (Signed)
Subjective: The patient is seen and examined today. She was accompanied by her 2 sons and friend. The patient continues to complain of shortness of breath and fatigue. She did not have significant improvement in her congestive heart failure. She denied having any fever or chills. She denied having any nausea or vomiting. She has no chest pain, cough or hemoptysis.  Objective: Vital signs in last 24 hours: Temp:  [97.9 F (36.6 C)-98.6 F (37 C)] 98.5 F (36.9 C) (11/13 0500) Pulse Rate:  [71-79] 79 (11/13 0500) Resp:  [18] 18 (11/13 0500) BP: (118-129)/(56) 118/56 mmHg (11/13 0500) SpO2:  [96 %-100 %] 96 % (11/13 0500) Weight:  [151 lb 7.3 oz (68.7 kg)] 151 lb 7.3 oz (68.7 kg) (11/13 0500)  Intake/Output from previous day: 11/12 0701 - 11/13 0700 In: 983.8 [P.O.:240; I.V.:643.8; IV Piggyback:100] Out: 750 [Urine:750] Intake/Output this shift: Total I/O In: -  Out: 600 [Urine:600]  General appearance: alert, cooperative, fatigued and no distress Resp: diminished breath sounds LLL and dullness to percussion LLL Cardio: regular rate and rhythm, S1, S2 normal, no murmur, click, rub or gallop GI: soft, non-tender; bowel sounds normal; no masses,  no organomegaly Extremities: extremities normal, atraumatic, no cyanosis or edema  Lab Results:   Recent Labs  12/19/12 0435 12/20/12 0655  WBC 6.0 6.6  HGB 12.8 13.2  HCT 38.4 40.6  PLT 236 257   BMET  Recent Labs  12/19/12 0435 12/20/12 0655 12/21/12 0544  NA 129* 137  --   K 3.5 3.3*  --   CL 89* 98  --   CO2 31 31  --   GLUCOSE 85 93  --   BUN 57* 27*  --   CREATININE 1.46* 0.81 0.59  CALCIUM 9.0 8.6  --     Studies/Results: No results found.  Medications: I have reviewed the patient's current medications.  CODE STATUS: no Code Blue  Assessment/Plan: Extensive stage small cell lung cancer with disease progression on recent scan. The patient was expected to start systemic chemotherapy last week but  unfortunately her deteriorating condition with congestive heart failure, COPD is a concern to start any chemotherapy at this point. I have a lengthy discussion with the patient and her family about her current condition. I strongly recommended for her to consider palliative care and hospice at this point. The patient and her family were in agreement with the current plan. She is expected to be discharged to long-term acute care facility, [facility]. She will follow up with me on as needed basis  LOS: 7 days    Samauri Kellenberger K. 12/21/2012

## 2012-12-21 NOTE — Progress Notes (Signed)
Notes reviewed- patient now has discharge summary in; Clinical research associate contacted Adela Ports, [facility] and now aware plan is for transfer to [facility] today, when this Clinical research associate spoke with patient these facts were not available to Clinical research associate;  -discussed with CMRN possibility of palliative consult at [facility] and requested CMRN to also contact son Julien Nordmann on his work number [phone] ext [phone]; as Clinical research associate had received return call from [place] relating to potential PMT consult here at [facility];  [facility] will follow-up with son Peyton Najjar to inform of plan  - Clinical research associate also spoke with attending, Dr Blake Divine to confirm above PMT will sign off at this time    Valente David, RN 12/21/2012, 1:43 PM Palliative Medicine Team RN Liaison [phone]

## 2012-12-21 NOTE — Progress Notes (Signed)
Hypoglycemic Event  CBG: 37  Treatment: 15 GM carbohydrate snack  Symptoms: None  Follow-up CBG: Time:0720 CBG Result:108  Possible Reasons for Event: Unknown  Comments/MD notified:Kirby    Aggie Hacker A  Remember to initiate Hypoglycemia Order Set & complete

## 2012-12-21 NOTE — Progress Notes (Signed)
Report called to [employer] .

## 2012-12-21 NOTE — Discharge Summary (Signed)
Physician Discharge Summary  ALIANNA WURSTER ZOX:[id number] DOB: 1931/04/29 DOA: 12/14/2012  PCP: [name], MD  Admit date: 12/14/2012 Discharge date: 12/21/2012  Time spent: 45 minutes  Recommendations for Outpatient Follow-up:  1. FOLLOW up with PCP in one to two weeks 2. Follow upwith [facility]IVE CARE SERVICES AT [facility] 3. FOLLOW UP WITH DR [facility] AS RECOMMENDED.  Discharge Diagnoses:  Principal Problem:   Acute respiratory distress Active Problems:   DM   HYPERLIPIDEMIA   HYPERTENSION   Malignant neoplasm of upper lobe, bronchus or lung   COPD (chronic obstructive pulmonary disease)   PNA (pneumonia)   Acute exacerbation of CHF (congestive heart failure)   Hypokalemia   Pleural effusion on left   Pericardial effusion   Hypotension, unspecified   ARF (acute renal failure)   Diet recommendation: LOW SODIUM DIET  Filed Weights   12/19/12 0555 12/20/12 0620 12/21/12 0500  Weight: 66.2 kg (145 lb 15.1 oz) 65.7 kg (144 lb 13.5 oz) 68.7 kg (151 lb 7.3 oz)    History of present illness:   Roma BATOUL LIMES is a 79 y.o. female With medical history of hypertension, diabetes, hypothyroidism, extensive status stage small cell lung cancer diagnosed in May of 2014 status post chemotherapy in June of 2014 with 5 cycles last cycle 10/04/2012, status post palliative radiotherapy to the left lung field completed 08/14/2012 who presents to the ED with a several hour history of worsening shortness of breath which started at night, orthopnea, wheezing, gasping for breath. Chest x-ray showed volume loss throughout the left hemithorax due to known left effusion and left lung cancer with small passive in the right midlung and lung base may be due to pneumonia or edema, cardiomegaly.  Patient was given some nebulizer treatments, IV Rocephin and azithromycin, IV steroids in the ED. She was admitted  for further evaluation and management. Pulmonary/ cardiology and renal were consulted.     Hospital Course:  #1 acute respiratory distress  Likely multifactorial secondary to acute CHF exacerbation, large left pleural effusion, probable pericardial effusion, pneumonia. Doubt if patient if patient has a COPD exacerbation that she's not wheezing and ABG was essentially normal. Patient states she's feeling clinically better. Repeat chest x-ray showed worsening left pleural effusion. Patient is status post thoracentesis. [name] 2-D echo shows a wall motion abnormality as well as a pericardial effusion. Repeat chest x-ray concern for multifocal pneumonia.she was started on empiric IV antibiotics and she completed 5 days, she will be discharged on IV antibiotics  to complete the course. She is currently on [place] oxygen with out any distress.   #2 large left pleural effusion  Questionable etiology. Status post thoracentesis. Concern for a malignant pleural effusion as patient does have a history of extensive lung cancer being followed by oncology. Chemistries consistent with a transudate. Cytology with reactive mesothelial cells. Pulmonary is following and appreciate input and recommendations.   #3 pericardial effusion  Questionable etiology. Concern for malignant effusion versus chemotherapy-induced effusion. Repeat 2-D echo done secondary to hypotension, shows decreased pericardial effusion. No signs of tamponade. Cardiology ff  #4 acute diastolic CHF exacerbation  Questionable etiology. Cardiac enzymes negative x3. 2-D echo with EF of 50-55% with hypokinesis of the inferior-posterior walls, and a large pericardial effusion noted.  We have held Lasix and ACE inhibitor secondary to worsening renal function.  her renal function is much better today. Continue aspirin, Lopressor. Cardiology following and appreciate input and recommendations. Recommend further management as per [facility]ology at [facility]. #5 probable multifocal pneumonia  Patient was noted to have emesis during the hospitalization. chest x-ray  which was done had persistent patchy airspace disease in the left upper lobe and bilateral lower lobe suspicious for pneumonia. Blood cultures are negative so far.. Continue empiric IV vancomycin and IV Zosyn for 5 more days to complete the course.  #6 hypotension  Questionable etiology. Likely secondary to volume depletion in the setting of underlying diuretics..Blood pressure responded to IV fluids. Lasix has been discontinued. ACE inhibitor discontinued. hypotension resolved.   #7 acute renal failure  Creatinine is elevated at 2.84 on 11/10 . Likely prerenal azotemia versus ATN . Patient was noted to be hypotensive during this hospitalization was on Lasix. Patient with CT scan done on 12/11/2012. ?? ATN. Fractional excretion of sodium is 0.42%. Discontinued Lasix and ACE inhibitor. Renal ultrasound negative for obstructive uropathy. Renal function improved.  #8 emesis/abdominal pain  Questionable etiology. Abdominal films yesterday were negative. Repeat abdominal films negative. Repeat UA negative. Supportive care, symptomatic treatment.  #9 progressive small cell lung cancer  Oncology follow up. Palliative care consulted as per Dr Arbutus Ped recommendations. Which can be [facility] up at [facility]. #10 hypokalemia  RepleteD.  #11 hyponatremia  Likely secondary to hypervolemic hyponatremia secondary to CHF exacerbation.  #12 diabetes mellitus  Hemoglobin A1c 7.7. CBGs have ranged from 62- 170. discontinued Lantus because of hypoglycemia episode this am. Changed to q4hr cbgs'.  #13 COPD  Stable. Continue nebulizers.  #14 hypertension  Stable. Discontinued ACE inhibitor secondary to elevated renal function. Continue low-dose beta blocker.   Consultants:  Cardiology : Dr. Eldridge Dace 12/15/2012  Pulmonary: Dr. Shan Levans 12/15/2012  Nephrology: Dr. Marisue Humble 12/18/2012 Procedures:  Chest x-ray 12/14/2012, 12/15/2012  2-D echo 12/14/2012, 12/17/2012  Thoracentesis 12/15/2012 Dr. Delford Field  Chest  x-ray/KUB 12/16/2012  Renal ultrasound 12/18/2012   Discharge Exam: Filed Vitals:   12/21/12 1051  BP: 146/68  Pulse:   Temp:   Resp:     General: NAD  Cardiovascular: RRR  Respiratory: Decreased breath sounds in the bases left greater than right. No wheezing.  Abdomen: Soft, tender to palpation, nondistended, positive bowel sounds.  Musculoskeletal: No clubbing cyanosis. Trace edema.   Discharge Instructions  Discharge Orders   Future Appointments Provider Department Dept Phone   12/22/2012 8:45 AM Dava Najjar [facility] MEDICAL ONCOLOGY [phone]   12/22/2012 9:15 AM Marlana Salvage [facility] MEDICAL ONCOLOGY [phone]   12/25/2012 8:30 AM [facility] [place] [facility] MED[facility]COL[facility]Y [phone]   01/01/2013 8:00 AM Windell Hummingbird [facility] MEDICAL ONCOLOGY [phone]   01/08/2013 8:15 AM Beverely Pace [facility] MEDICAL ONCOLOGY [phone]   01/15/2013 8:00 AM Windell Hummingbird [facility] MEDICAL ONCOLOGY [phone]   01/22/2013 8:00 AM Windell Hummingbird [facility] MEDICAL ONCOLOGY [phone]   Future Orders Complete By Expires   Diet - low sodium heart healthy  As directed    Discharge instructions  As directed    Comments:     Follow up with PCP in 1 to 2 week.s Follow up with Dr Arbutus Ped as recommended Follow up with [facility] in 1 weeks.       Medication List    STOP taking these medications       Insulin Glargine 100 UNIT/ML Sopn  Commonly known as:  LANTUS SOLOSTAR      TAKE these medications       albuterol (5 MG/ML) 0.5% nebulizer solution  Commonly  known as:  PROVENTIL  Take 0.5 mLs (2.5 mg total) by nebulization every 4 (four) hours as needed for wheezing or shortness of breath.     aspirin EC 81 MG tablet  Take 81 mg by mouth daily.     B-D ULTRAFINE III SHORT PEN 31G X 8 MM Misc  Generic drug:  Insulin Pen  Needle  USE AS DIRECTED EVERY MORNING     clonazePAM 0.5 MG tablet  Commonly known as:  KLONOPIN  Take 2 tablets (1 mg total) by mouth at bedtime.     doxepin 25 MG capsule  Commonly known as:  SINEQUAN  Take 1 capsule (25 mg total) by mouth at bedtime.     DSS 100 MG Caps  Take 100 mg by mouth 2 (two) times daily.     feeding supplement (ENSURE COMPLETE) Liqd  Take 237 mLs by mouth daily.     feeding supplement (GLUCERNA SHAKE) Liqd  Take 237 mLs by mouth daily as needed (Please offer to pt if consumes <75% of meal).     insulin aspart 100 UNIT/ML injection  Commonly known as:  novoLOG  Inject 0-9 Units into the skin every 4 (four) hours.     ipratropium 0.02 % nebulizer solution  Commonly known as:  ATROVENT  Take 2.5 mLs (0.5 mg total) by nebulization every 4 (four) hours as needed for wheezing or shortness of breath.     levothyroxine 112 MCG tablet  Commonly known as:  SYNTHROID, LEVOTHROID  Take 1 tablet (112 mcg total) by mouth daily.     lidocaine-prilocaine cream  Commonly known as:  EMLA  Apply topically as needed. Apply to port 1 hour before chemotherapy     lipase/protease/amylase 40981 UNITS Cpep capsule  Commonly known as:  CREON-12/PANCREASE  Take 1 capsule by mouth 3 (three) times daily before meals.     metoprolol tartrate 12.5 mg Tabs tablet  Commonly known as:  LOPRESSOR  Take 0.5 tablets (12.5 mg total) by mouth every 12 (twelve) hours.     piperacillin-tazobactam 3.375 GM/50ML IVPB  Commonly known as:  ZOSYN  Inject 50 mLs (3.375 g total) into the vein every 8 (eight) hours.     prochlorperazine 10 MG tablet  Commonly known as:  COMPAZINE  Take 1 tablet (10 mg total) by mouth every 6 (six) hours as needed.     sodium chloride 0.9 % SOLN 100 mL with vancomycin 500 MG SOLR 500 mg  Inject 500 mg into the vein every 12 (twelve) hours.       Allergies  Allergen Reactions  . Sulfa Antibiotics Rash       Follow-up Information   Follow up  with [name], MD. Schedule an appointment as soon as possible for a visit in 1 week.   Specialty:  Family Medicine   Contact information:   [address] [place] [address] [phone]       Follow up with Corky Crafts., MD. Schedule an appointment as soon as possible for a visit in 1 week.   Specialty:  Cardiology   Contact information:   [address] [place] [address] [phone]        The results of significant diagnostics from this hospitalization (including imaging, microbiology, ancillary and laboratory) are listed below for reference.    Significant Diagnostic Studies: Dg Chest 2 View  12/14/2012   CLINICAL DATA:  Shortness of breath, cough, chest tightness  EXAM: CHEST  2 VIEW  COMPARISON:  The CT chest of 12/11/2012 and chest x-ray of 05/17/2012  FINDINGS: As noted on recent CT, much of the opacity throughout the left hemi thorax is due to left effusion as well as known small cell carcinoma in the left lung. There is more opacity at the right lung base, and the considerations are that of superimposed pneumonia or edema. Cardiomegaly is stable. A right-sided palpable Port-A-Cath is present. Mild cardiomegaly is stable. [name]  IMPRESSION: 1. Volume loss throughout the left hemi thorax due to known left effusion and left lung carcinoma. 2. More opacity in the right mid lung and lung base may be due to pneumonia or edema. 3. Cardiomegaly.   Electronically Signed   By: Dwyane Dee M.D.   On: 12/14/2012 09:13   Dg Abd 1 View  12/17/2012   CLINICAL DATA:  Abdominal pain, nausea and vomiting.  EXAM: ABDOMEN - 1 VIEW  COMPARISON:  12/16/2012.  FINDINGS: Normal bowel gas pattern. Lumbar spine degenerative changes. Atheromatous arterial calcifications. Cholecystectomy clips.  IMPRESSION: No acute abnormality.   Electronically Signed   By: Gordan Payment M.D.   On: 12/17/2012 17:03   Ct Chest W Contrast  12/11/2012   CLINICAL DATA:  Small cell lung  cancer. Cough. Previous radiation therapy and chemotherapy. Nausea and vomiting.  EXAM: CT CHEST, ABDOMEN, AND PELVIS WITH CONTRAST  TECHNIQUE: Multidetector CT imaging of the chest, abdomen and pelvis was performed following the standard protocol during bolus administration of intravenous contrast.  CONTRAST:  OMNIPAQUE IOHEXOL 300 MG/ML  SOLN  COMPARISON:  09/27/2012.  FINDINGS: CT CHEST FINDINGS  No pathologically enlarged mediastinal lymph nodes. Bi hilar lymphoid tissue. No axillary adenopathy. Atherosclerotic calcification of the arterial vasculature, including extensive involvement of the coronary arteries. Ascending aorta measures 4 cm. Incidental note is made of left vertebral artery origin from the proximal left subclavian artery or aortic arch. Pulmonary arteries are enlarged. Heart is enlarged. No pericardial effusion.  Moderate left pleural effusion, new. No definite pleural nodularity. Progressive consolidative change in the left upper lobe, including the lingular portion. Compressive atelectasis in the left lower lobe. Scarring and volume loss in the right hemi thorax. A few scattered tiny nodular densities in the right lung are likely unchanged. Airway is otherwise unremarkable.  CT ABDOMEN AND PELVIS FINDINGS  Minimal intrahepatic and extrahepatic biliary duct dilatation, unchanged. Liver is otherwise unremarkable. Cholecystectomy.  Adrenal nodules have enlarged, measuring 2.2 x 1.9 cm on the right (previously 1.1 x 1.4 cm) and 2.0 x 2.7 cm on the left (previously 1.0 x 1.6 cm).  Renal vascular calcifications bilaterally. Sub cm low-attenuation lesions in the right kidney are too small to characterize but stable. Possible left renal stones, no obstruction. Spleen, pancreas, stomach and bowel are unremarkable. Incidental note is made of a duodenal diverticulum.  Atherosclerotic calcification of the arterial vasculature without abdominal aortic aneurysm. Hysterectomy. No pathologically enlarged  lymph nodes. No free fluid. There is focal dilatation of the left gonadal vein, anterior to the left psoas muscle (series 2, image 88), simulating a lymph node. Finding is unchanged. Small bowel mesenteric lymph nodes measure up to 8 mm in short axis, more prominent on the prior exam but not enlarged by CT size criteria.  A small rounded sclerotic lesion in the right iliac wing (image 90) is unchanged. Areas of sclerosis in the iliac wings, adjacent to the sacroiliac joints, also appear stable. Degenerative changes are seen throughout the spine. Possible old fracture involving the left 5th anterolateral rib. Mild T6  compression fracture is unchanged.  IMPRESSION: 1. Interval progression of small cell lung cancer, as evidenced by increasing consolidation in the left upper lobe, new moderate left pleural effusion and enlarging bilateral adrenal metastases. 2. Areas of sclerosis in the iliac wings appear stable. 3. Borderline ascending aortic aneurysm. Enlarged pulmonary arteries are indicative of pulmonary arterial hypertension. Extensive coronary artery calcifications. 4. Possible left renal stones. 5. Increase in prominence of mesenteric lymph nodes, not pathologically enlarged by CT size criteria, nonspecific. Continued attention on followup exams is warranted.   Electronically Signed   By: Leanna Battles M.D.   On: 12/11/2012 10:49   Ct Abdomen Pelvis W Contrast  12/11/2012   CLINICAL DATA:  Small cell lung cancer. Cough. Previous radiation therapy and chemotherapy. Nausea and vomiting.  EXAM: CT CHEST, ABDOMEN, AND PELVIS WITH CONTRAST  TECHNIQUE: Multidetector CT imaging of the chest, abdomen and pelvis was performed following the standard protocol during bolus administration of intravenous contrast.  CONTRAST:  OMNIPAQUE IOHEXOL 300 MG/ML  SOLN  COMPARISON:  09/27/2012.  FINDINGS: CT CHEST FINDINGS  No pathologically enlarged mediastinal lymph nodes. Bi hilar lymphoid tissue. No axillary adenopathy.  Atherosclerotic calcification of the arterial vasculature, including extensive involvement of the coronary arteries. Ascending aorta measures 4 cm. Incidental note is made of left vertebral artery origin from the proximal left subclavian artery or aortic arch. Pulmonary arteries are enlarged. Heart is enlarged. No pericardial effusion.  Moderate left pleural effusion, new. No definite pleural nodularity. Progressive consolidative change in the left upper lobe, including the lingular portion. Compressive atelectasis in the left lower lobe. Scarring and volume loss in the right hemi thorax. A few scattered tiny nodular densities in the right lung are likely unchanged. Airway is otherwise unremarkable.  CT ABDOMEN AND PELVIS FINDINGS  Minimal intrahepatic and extrahepatic biliary duct dilatation, unchanged. Liver is otherwise unremarkable. Cholecystectomy.  Adrenal nodules have enlarged, measuring 2.2 x 1.9 cm on the right (previously 1.1 x 1.4 cm) and 2.0 x 2.7 cm on the left (previously 1.0 x 1.6 cm).  Renal vascular calcifications bilaterally. Sub cm low-attenuation lesions in the right kidney are too small to characterize but stable. Possible left renal stones, no obstruction. Spleen, pancreas, stomach and bowel are unremarkable. Incidental note is made of a duodenal diverticulum.  Atherosclerotic calcification of the arterial vasculature without abdominal aortic aneurysm. Hysterectomy. No pathologically enlarged lymph nodes. No free fluid. There is focal dilatation of the left gonadal vein, anterior to the left psoas muscle (series 2, image 88), simulating a lymph node. Finding is unchanged. Small bowel mesenteric lymph nodes measure up to 8 mm in short axis, more prominent on the prior exam but not enlarged by CT size criteria.  A small rounded sclerotic lesion in the right iliac wing (image 90) is unchanged. Areas of sclerosis in the iliac wings, adjacent to the sacroiliac joints, also appear stable.  Degenerative changes are seen throughout the spine. Possible old fracture involving the left 5th anterolateral rib. Mild T6 compression fracture is unchanged.  IMPRESSION: 1. Interval progression of small cell lung cancer, as evidenced by increasing consolidation in the left upper lobe, new moderate left pleural effusion and enlarging bilateral adrenal metastases. 2. Areas of sclerosis in the iliac wings appear stable. 3. Borderline ascending aortic aneurysm. Enlarged pulmonary arteries are indicative of pulmonary arterial hypertension. Extensive coronary artery calcifications. 4. Possible left renal stones. 5. Increase in prominence of mesenteric lymph nodes, not pathologically enlarged by CT size criteria, nonspecific. Continued attention on followup  exams is warranted.   Electronically Signed   By: Leanna Battles M.D.   On: 12/11/2012 10:49   US Renal  12/18/2012   CLINICAL DATA:  Acute renal failure  EXAM: RENAL/URINARY TRACT ULTRASOUND COMPLETE  COMPARISON:  12/11/2012  FINDINGS: Right Kidney  Length: 13.5 cm. Echogenicity within normal limits. No mass or hydronephrosis visualized.  Left Kidney  Length: Measures 13.6 cm spot. Echogenicity within normal limits. No mass or hydronephrosis visualized. 5 mm nonobstructing calculus is identified within the inferior pole  Bladder  Appears normal for degree of bladder distention.  IMPRESSION: 1. No evidence for obstructive uropathy.  2. Left renal calculi   Electronically Signed   By: Signa Kell M.D.   On: 12/18/2012 14:41   Dg Chest Port 1 View  12/16/2012   CLINICAL DATA:  Lethargy  EXAM: PORTABLE CHEST - 1 VIEW  COMPARISON:  11/8/ 14  FINDINGS: Cardiomediastinal silhouette is stable. Right Port-A-Cath is unchanged in position. Persistent patchy airspace disease left upper lobe and bilateral lower lobes suspicious for multifocal pneumonia.  IMPRESSION: Persistent patchy airspace disease left upper lobe and bilateral lower lobe suspicious for pneumonia.  Stable right Port-A-Cath position.   Electronically Signed   By: Natasha Mead M.D.   On: 12/16/2012 18:17   Dg Chest Port 1 View  12/16/2012   CLINICAL DATA:  Cough, shortness of breath, follow-up pleural effusion  EXAM: PORTABLE CHEST - 1 VIEW  COMPARISON:  12/15/2012  FINDINGS: Patchy left upper lobe opacity, suspicious for pneumonia. Additional patchy bilateral lower lobe opacities, atelectasis versus pneumonia.  Left pleural effusion is not well visualized on the current study, likely improved. No pneumothorax.  Cardiomegaly.  Stable right chest port.  IMPRESSION: Patchy left upper lobe opacity, suspicious for pneumonia.  Additional patchy bilateral lower lobe opacities, atelectasis versus pneumonia.  Left pleural effusion is not well visualized and likely improved.   Electronically Signed   By: Charline Bills M.D.   On: 12/16/2012 10:49   Dg Chest Port 1 View  12/15/2012   CLINICAL DATA:  Status post left thoracentesis.  EXAM: PORTABLE CHEST - 1 VIEW  COMPARISON:  Multiple priors  FINDINGS: Right Port-A-Cath is unchanged in position. Cardiomediastinal silhouette is unchanged. Interval decrease in left pleural effusion following thoracentesis. Opacity is again identified at the left lung apex and left base. Mild right basilar opacity is noted. There is prominence of the interstitial markings.  IMPRESSION: 1. Improvement in left lung aeration following thoracentesis with residual left basilar opacity. No pneumothorax.  2.  Mild right basilar opacity, likely atelectasis.  3.  Continued left upper lobe opacity.  4.  Pulmonary vascular congestion.   Electronically Signed   By: Jerene Dilling M.D.   On: 12/15/2012 12:01   Dg Chest Port 1 View  12/15/2012   CLINICAL DATA:  Shortness of breath, effusion  EXAM: PORTABLE CHEST - 1 VIEW  COMPARISON:  Prior radiograph from 12/14/2012  FINDINGS: Right-sided Port-A-Cath is stable in position. Cardiomegaly is unchanged.  There has been interval worsening of a  left-sided effusion with decreased aeration of the left hemi thorax as compared to the most recent examination. Pleural fluid is now seen capping the left lung apex. The known small cell lung cancer in the left lung is likely unchanged. The right hemi thorax is grossly clear. No pneumothorax.  Osseous structures are unchanged.  IMPRESSION: Interval worsening of left pleural effusion with decreased aeration of the left lung as compared to 12/14/12.   Electronically Signed  By: Rise Mu M.D.   On: 12/15/2012 05:54   Dg Abd Portable 1v  12/16/2012   CLINICAL DATA:  Abdominal pain and emesis  EXAM: PORTABLE ABDOMEN - 1 VIEW  COMPARISON:  None.  FINDINGS: There are no abnormally dilated loops of bowel. There is stool distending the rectum somewhat. Cholecystectomy clips are noted in the right upper quadrant.  IMPRESSION: Nonobstructive gas pattern. Mild distal fecal retention.   Electronically Signed   By: Esperanza Heir M.D.   On: 12/16/2012 18:19    Microbiology: Recent Results (from the past 240 hour(s))  CULTURE, EXPECTORATED SPUTUM-ASSESSMENT     Status: None   Collection Time    12/14/12  2:18 PM      Result Value Range Status   Specimen Description SPUTUM   Final   Special Requests NONE   Final   Sputum evaluation     Final   Value: THIS SPECIMEN IS ACCEPTABLE. RESPIRATORY CULTURE REPORT TO FOLLOW.   Report Status 12/14/2012 FINAL   Final  CULTURE, RESPIRATORY (NON-EXPECTORATED)     Status: None   Collection Time    12/14/12  2:18 PM      Result Value Range Status   Specimen Description SPUTUM   Final   Special Requests NONE   Final   Gram Stain     Final   Value: MODERATE WBC PRESENT,BOTH PMN AND MONONUCLEAR     MODERATE SQUAMOUS EPITHELIAL CELLS PRESENT     FEW GRAM POSITIVE COCCI     IN PAIRS     Performed at [facility]   Culture     Final   Value: NORMAL OROPHARYNGEAL FLORA     Performed at [facility]   Report Status 12/17/2012 FINAL   Final   URINE CULTURE     Status: None   Collection Time    12/14/12  2:58 PM      Result Value Range Status   Specimen Description URINE, RANDOM   Final   Special Requests NONE   Final   Culture  Setup Time     Final   Value: 12/14/2012 20:46     Performed at [facility] Count     Final   Value: NO GROWTH     Performed at [facility]   Culture     Final   Value: NO GROWTH     Performed at [facility]   Report Status 12/15/2012 FINAL   Final  BODY FLUID CULTURE     Status: None   Collection Time    12/15/12 11:35 AM      Result Value Range Status   Specimen Description PLEURAL   Final   Special Requests Normal   Final   Gram Stain     Final   Value: NO WBC SEEN     NO ORGANISMS SEEN     Performed at [facility]   Culture     Final   Value: NO GROWTH 3 DAYS     Performed at [facility]   Report Status 12/18/2012 FINAL   Final  CULTURE, BLOOD (ROUTINE X 2)     Status: None   Collection Time    12/16/12  7:05 PM      Result Value Range Status   Specimen Description BLOOD RIGHT ARM  5 ML IN [facility] BOTTLE   Final   Special Requests NONE   Final   Culture  Setup  Time     Final   Value: 12/17/2012 06:18     Performed at [facility]   Culture     Final   Value:        BLOOD CULTURE RECEIVED NO GROWTH TO DATE CULTURE WILL BE HELD FOR 5 DAYS BEFORE ISSUING A FINAL NEGATIVE REPORT     Performed at [facility]   Report Status PENDING   Incomplete  CULTURE, BLOOD (ROUTINE X 2)     Status: None   Collection Time    12/16/12  7:09 PM      Result Value Range Status   Specimen Description BLOOD RIGHT HAND  5 ML IN [facility] BOTTLE   Final   Special Requests NONE   Final   Culture  Setup Time     Final   Value: 12/17/2012 06:18     Performed at [facility]   Culture     Final   Value:        BLOOD CULTURE RECEIVED NO GROWTH TO DATE CULTURE WILL BE HELD FOR 5 DAYS BEFORE ISSUING A FINAL NEGATIVE REPORT      Performed at [facility]   Report Status PENDING   Incomplete  URINE CULTURE     Status: None   Collection Time    12/17/12  4:38 PM      Result Value Range Status   Specimen Description URINE, CATHETERIZED   Final   Special Requests NONE   Final   Culture  Setup Time     Final   Value: 12/18/2012 00:03     Performed at [facility] Count     Final   Value: NO GROWTH     Performed at [facility]   Culture     Final   Value: NO GROWTH     Performed at [facility]   Report Status 12/19/2012 FINAL   Final  URINE CULTURE     Status: None   Collection Time    12/19/12  6:11 AM      Result Value Range Status   Specimen Description URINE, CLEAN CATCH   Final   Special Requests NONE   Final   Culture  Setup Time     Final   Value: 12/19/2012 08:53     Performed at [facility]   Culture     Final   Value: 25,000 COLONIES/mL YEAST     Performed at [facility]   Report Status 12/20/2012 FINAL   Final     Labs: Basic Metabolic Panel:  Recent Labs Lab 12/14/12 1510  12/18/12 0515 12/18/12 1355 12/19/12 0435 12/20/12 0655 12/21/12 0544 12/21/12 0546  NA  --   < > 128* 127* 129* 137  --  137  K  --   < > 3.6 3.2* 3.5 3.3*  --  3.6  CL  --   < > 86* 87* 89* 98  --  102  CO2  --   < > 24 26 31 31   --  27  GLUCOSE  --   < > 145* 207* 85 93  --  49*  BUN  --   < > 76* 70* 57* 27*  --  12  CREATININE 0.58  < > 2.84* 2.20* 1.46* 0.81 0.59 0.52  CALCIUM  --   < > 8.9 8.3* 9.0 8.6  --  8.3*  MG 1.9  --   --   --   --   --   --   --  PHOS  --   --   --  5.7*  --   --   --   --   < > = values in this interval not displayed. Liver Function Tests:  Recent Labs Lab 12/15/12 0134 12/16/12 0530 12/18/12 0515  AST  --  17 19  ALT  --  8 13  ALKPHOS  --  98 81  BILITOT  --  0.5 0.5  PROT 6.7 6.8 6.9  ALBUMIN  --  3.1* 2.6*   No results found for this basename: LIPASE, AMYLASE,  in the last 168 hours No results found  for this basename: AMMONIA,  in the last 168 hours CBC:  Recent Labs Lab 12/16/12 0530 12/16/12 1905 12/17/12 0546 12/18/12 0515 12/19/12 0435 12/20/12 0655  WBC 11.6* 10.5 8.5 8.2 6.0 6.6  NEUTROABS 10.4* 9.0*  --  6.7  --   --   HGB 14.4 14.8 14.6 13.9 12.8 13.2  HCT 43.2 44.2 44.3 41.0 38.4 40.6  MCV 88.0 87.0 86.9 86.7 86.1 88.5  PLT 258 243 288 270 236 257   Cardiac Enzymes:  Recent Labs Lab 12/14/12 1510 12/14/12 1946 12/15/12 0134  TROPONINI <0.30 <0.30 <0.30   BNP: BNP (last 3 results)  Recent Labs  12/14/12 0859 12/15/12 0134  PROBNP 3571.0* 9868.0*   CBG:  Recent Labs Lab 12/20/12 2140 12/21/12 0216 12/21/12 0617 12/21/12 0726 12/21/12 1206  GLUCAP 184* 98 37* 108* 187*       Signed:  Meeka Cartelli  [facility] 12/21/2012, 12:31 PM

## 2012-12-22 ENCOUNTER — Other Ambulatory Visit: Payer: Medicare Other | Admitting: Lab

## 2012-12-22 ENCOUNTER — Ambulatory Visit: Payer: Medicare Other | Admitting: Physician Assistant

## 2012-12-23 LAB — CULTURE, BLOOD (ROUTINE X 2): Culture: NO GROWTH

## 2012-12-25 ENCOUNTER — Ambulatory Visit: Payer: Medicare Other

## 2012-12-26 ENCOUNTER — Other Ambulatory Visit: Payer: Medicare Other | Admitting: Lab

## 2013-01-01 ENCOUNTER — Other Ambulatory Visit: Payer: Medicare Other | Admitting: Lab

## 2013-01-08 ENCOUNTER — Telehealth: Payer: Self-pay | Admitting: Radiology

## 2013-01-08 ENCOUNTER — Other Ambulatory Visit: Payer: Medicare Other

## 2013-01-08 ENCOUNTER — Other Ambulatory Visit: Payer: Self-pay | Admitting: Radiology

## 2013-01-08 ENCOUNTER — Telehealth: Payer: Self-pay | Admitting: Medical Oncology

## 2013-01-08 NOTE — Telephone Encounter (Signed)
[facility]. To advise, but I do not see where [place] has ever seen her. I have recommended she call Dr Shirline Frees at the cancer center, she will do this. To you FYI

## 2013-01-08 NOTE — Telephone Encounter (Signed)
I am happy to see patient any time.  But it would be better if she followed up with [facility] team since her case is so complicated

## 2013-01-08 NOTE — Telephone Encounter (Signed)
Josephine notified that Dr Arbutus Ped will be attending.

## 2013-01-08 NOTE — Telephone Encounter (Signed)
Misty Ball from [place] is calling. Patient has been at [facility], released home today, is wanting to know if you will be her attending? Wants to know also if you can give order for the hospice physicians to assist you with her care. Call back number is [phone]

## 2013-01-11 ENCOUNTER — Telehealth: Payer: Self-pay | Admitting: Medical Oncology

## 2013-01-11 NOTE — Telephone Encounter (Signed)
I told [facility] that there was no record of pt getting Pleurix catheter and to please call [facility] for records.

## 2013-01-11 NOTE — Telephone Encounter (Signed)
erroneous

## 2013-01-15 ENCOUNTER — Other Ambulatory Visit: Payer: Medicare Other | Admitting: Lab

## 2013-01-22 ENCOUNTER — Other Ambulatory Visit: Payer: Medicare Other

## 2013-02-08 DEATH — deceased

## 2013-02-26 ENCOUNTER — Telehealth: Payer: Self-pay

## 2013-02-26 NOTE — Telephone Encounter (Signed)
Patient past away @ Home per [facility] in [place]

## 2013-03-15 ENCOUNTER — Ambulatory Visit: Payer: Medicare Other | Admitting: Family Medicine

## 2013-10-02 ENCOUNTER — Other Ambulatory Visit: Payer: Self-pay | Admitting: Pharmacist

## 2013-11-22 ENCOUNTER — Encounter: Payer: Self-pay | Admitting: *Deleted

## 2014-09-28 IMAGING — CR DG CHEST 1V PORT
1 series · 1 of 1 positions shown · non-contrast
Comparison: Prior radiograph from 12/14/2012

CLINICAL DATA: Shortness of breath, effusion

EXAM:
PORTABLE CHEST - 1 VIEW

[AP]
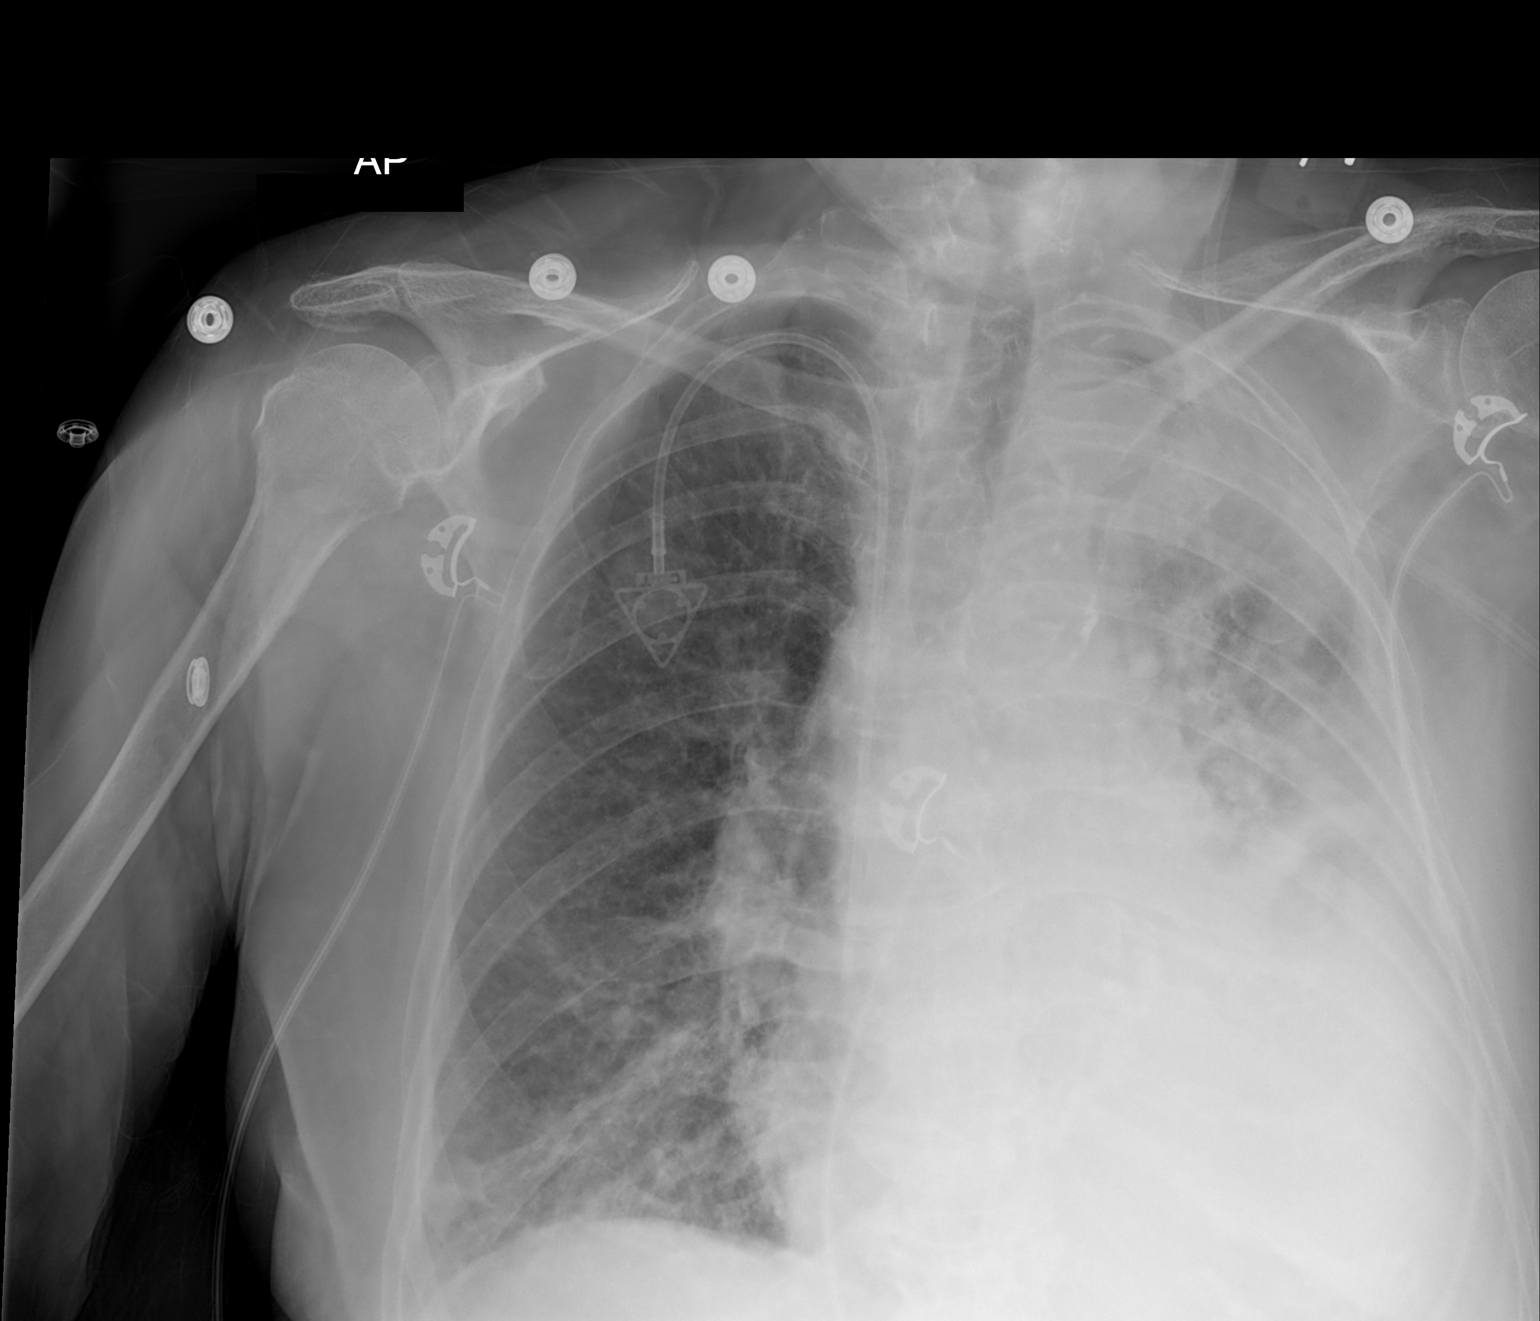

[1 of 1 positions shown; findings below may reference images not displayed]

FINDINGS: Right-sided Port-A-Cath is stable in position. Cardiomegaly is
unchanged.

There has been interval worsening of a left-sided effusion with
decreased aeration of the left hemi thorax as compared to the most
recent examination. Pleural fluid is now seen capping the left lung
apex. The known small cell lung cancer in the left lung is likely
unchanged. The right hemi thorax is grossly clear. No pneumothorax.

Osseous structures are unchanged.
IMPRESSION: Interval worsening of left pleural effusion with decreased aeration
of the left lung as compared to 12/14/12.

## 2014-09-29 IMAGING — CR DG CHEST 1V PORT
1 series · 1 of 1 positions shown · non-contrast
Comparison: 12/15/2012

CLINICAL DATA: Cough, shortness of breath, follow-up pleural
effusion

EXAM:
PORTABLE CHEST - 1 VIEW

[AP]
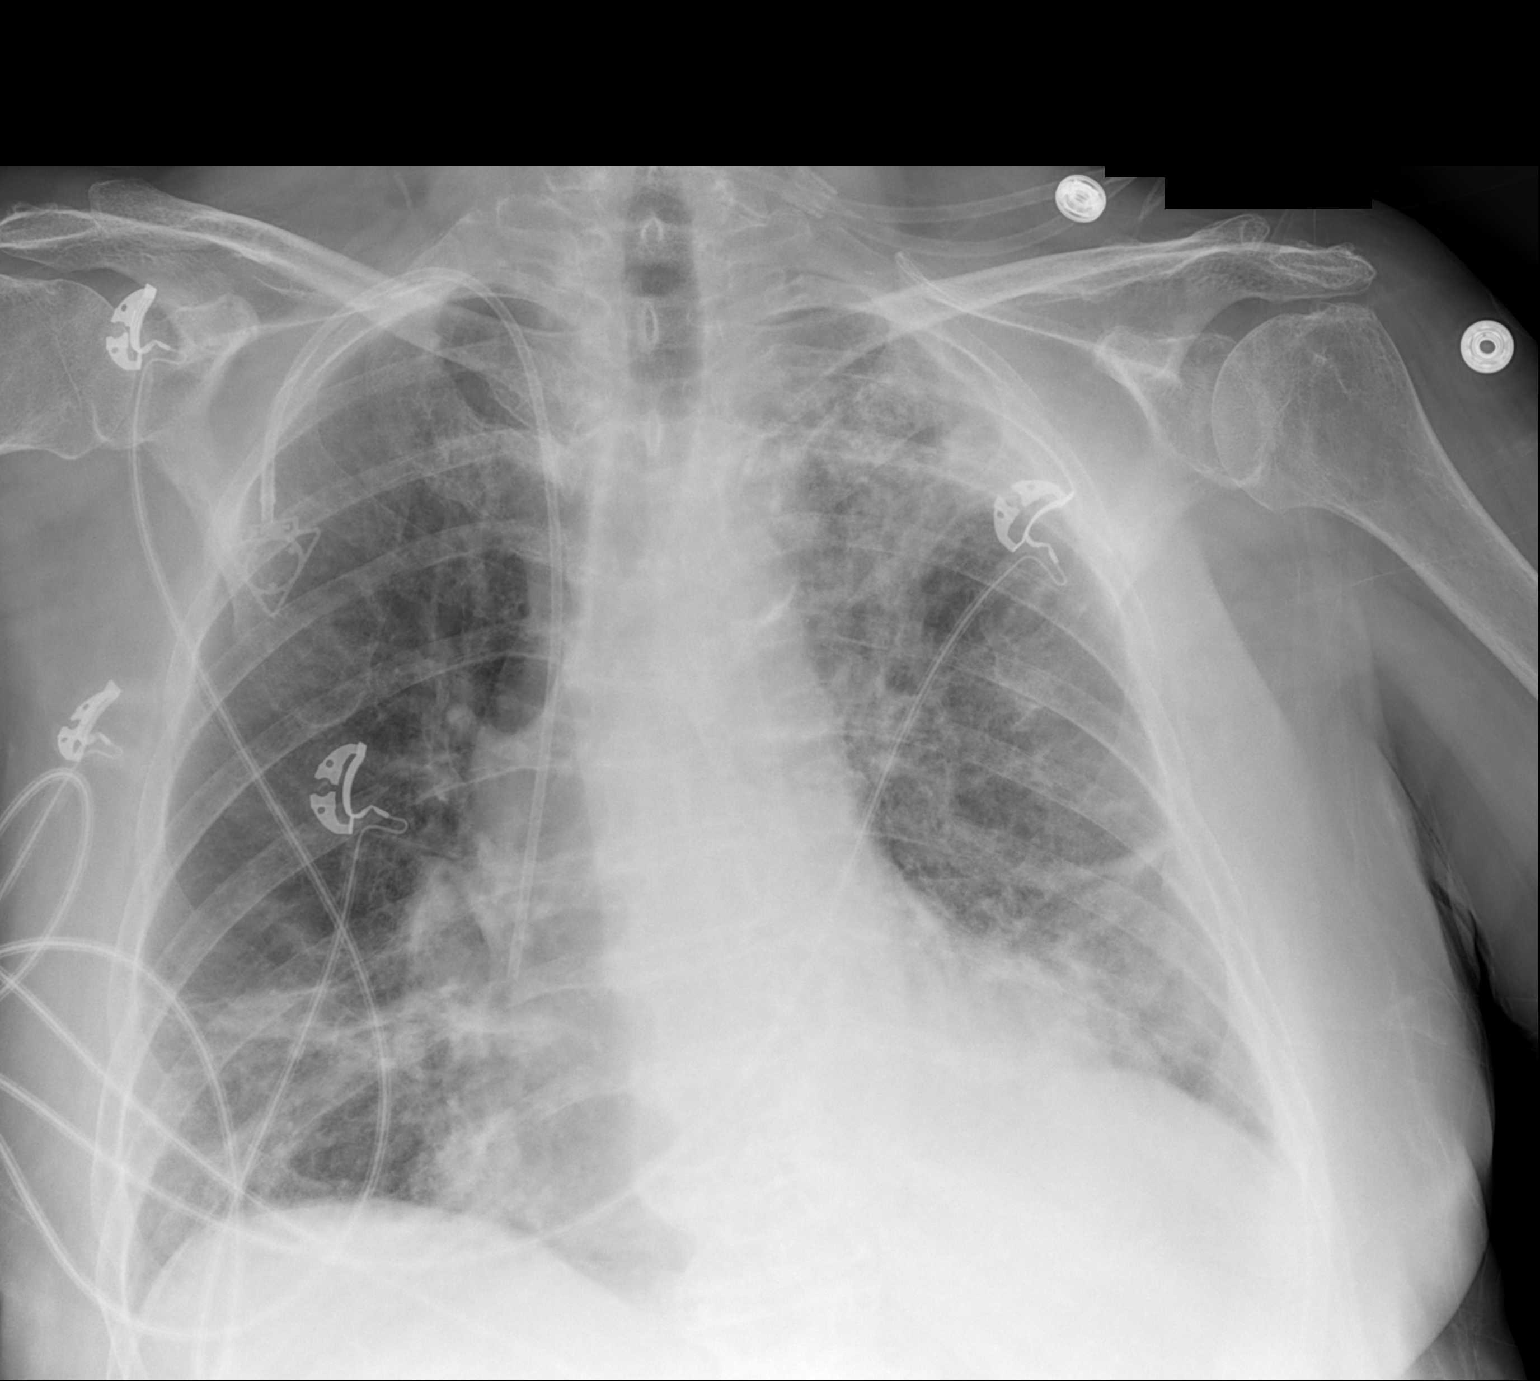

[1 of 1 positions shown; findings below may reference images not displayed]

FINDINGS: Patchy left upper lobe opacity, suspicious for pneumonia. Additional
patchy bilateral lower lobe opacities, atelectasis versus pneumonia.

Left pleural effusion is not well visualized on the current study,
likely improved. No pneumothorax.

Cardiomegaly.

Stable right chest port.
IMPRESSION: Patchy left upper lobe opacity, suspicious for pneumonia.

Additional patchy bilateral lower lobe opacities, atelectasis versus
pneumonia.

Left pleural effusion is not well visualized and likely improved.

## 2014-09-29 IMAGING — CR DG CHEST 1V PORT
1 series · 1 of 1 positions shown · non-contrast
Comparison: [DATE]

CLINICAL DATA: Lethargy

EXAM:
PORTABLE CHEST - 1 VIEW

[AP]
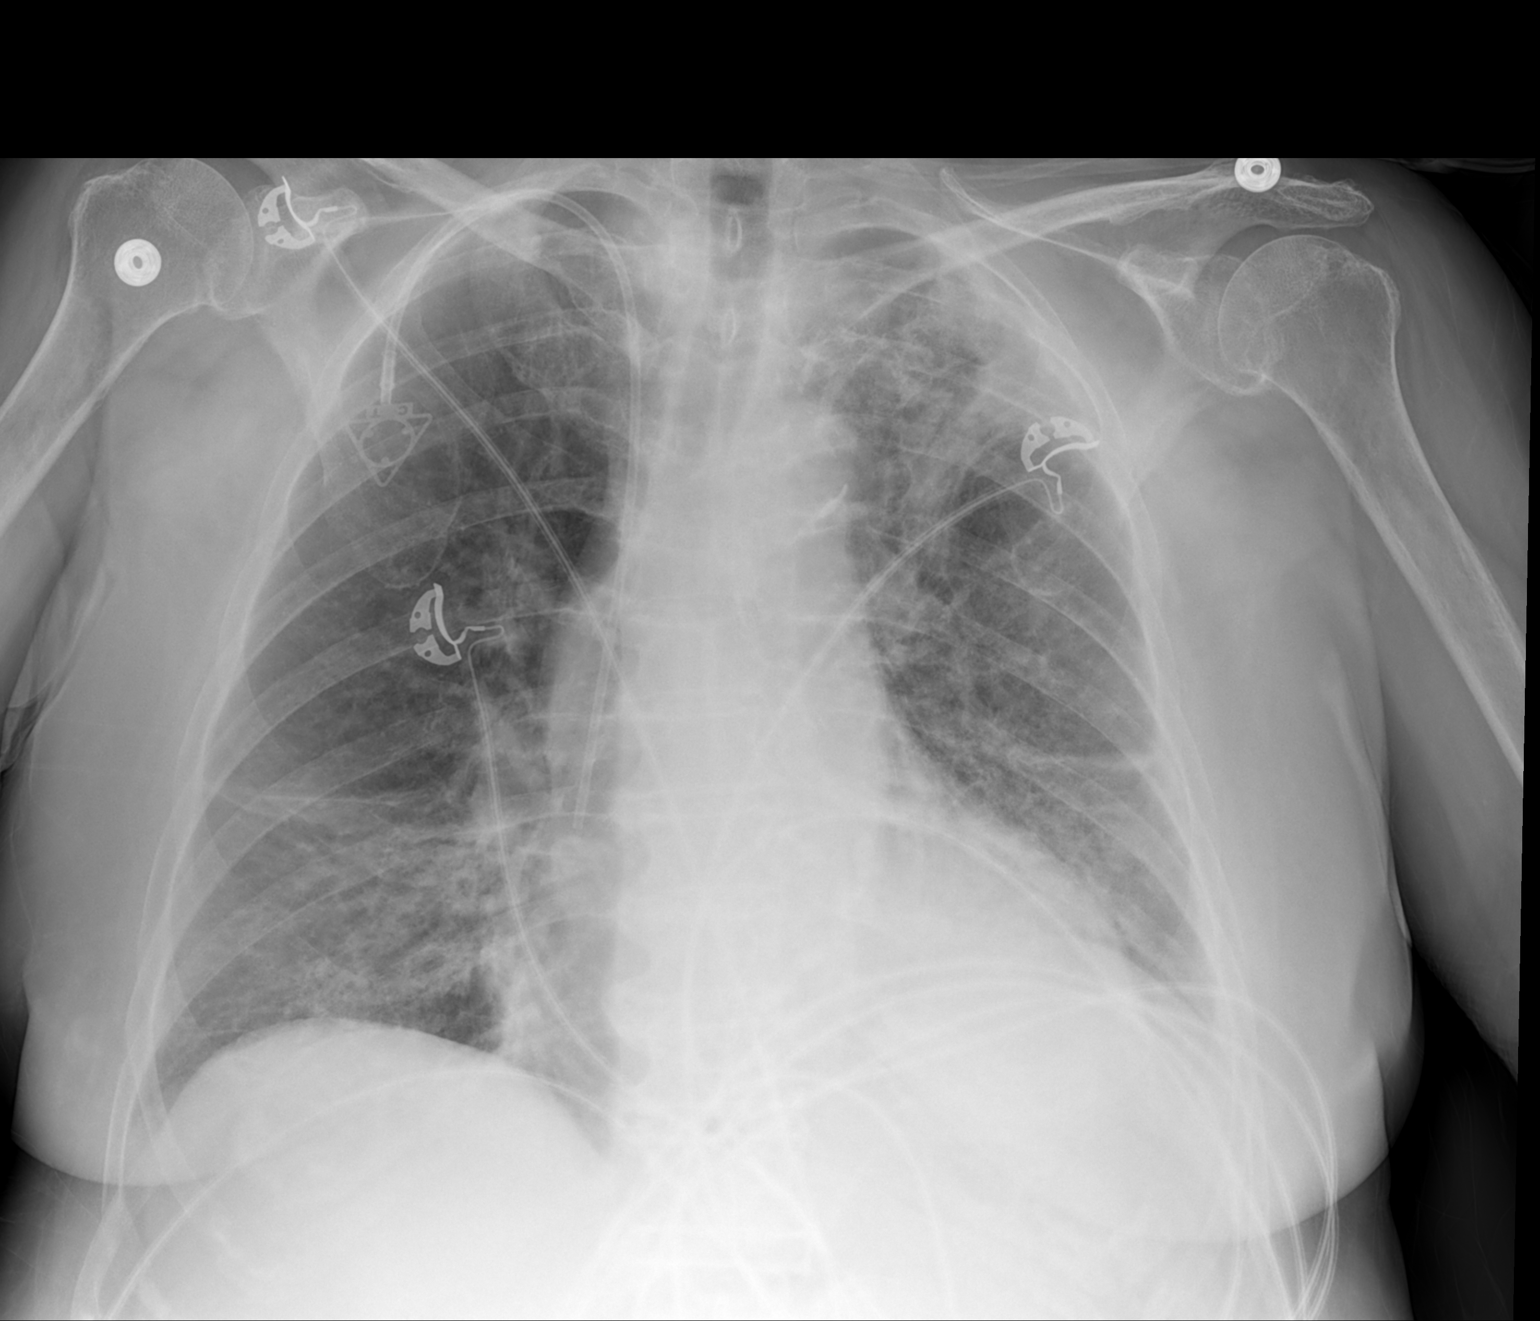

[1 of 1 positions shown; findings below may reference images not displayed]

FINDINGS: Cardiomediastinal silhouette is stable. Right Port-A-Cath is
unchanged in position. Persistent patchy airspace disease left upper
lobe and bilateral lower lobes suspicious for multifocal pneumonia.
IMPRESSION: Persistent patchy airspace disease left upper lobe and bilateral
lower lobe suspicious for pneumonia. Stable right Port-A-Cath
position.

## 2014-09-29 IMAGING — CR DG ABD PORTABLE 1V
1 series · 1 of 1 positions shown · non-contrast
Comparison: None.

CLINICAL DATA: Abdominal pain and emesis

EXAM:
PORTABLE ABDOMEN - 1 VIEW

[ap (kub)]
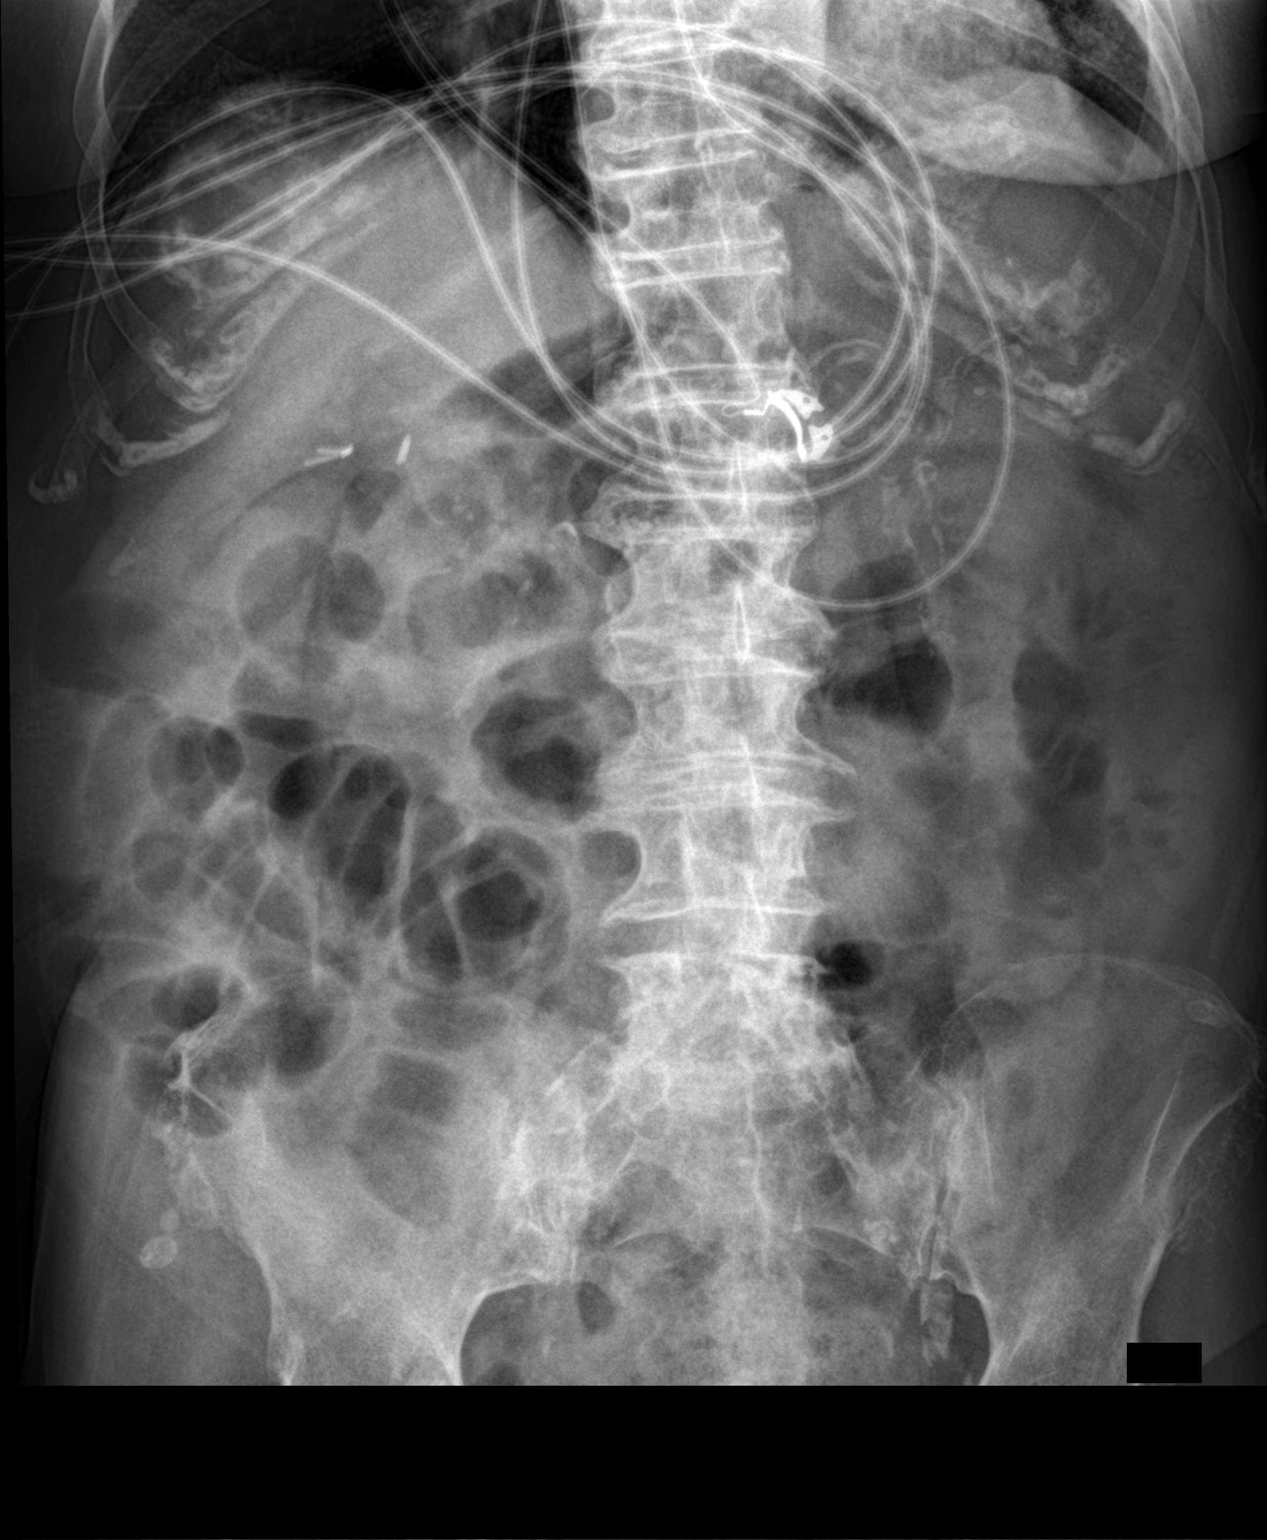

[1 of 1 positions shown; findings below may reference images not displayed]

FINDINGS: There are no abnormally dilated loops of bowel. There is stool
distending the rectum somewhat. Cholecystectomy clips are noted in
the right upper quadrant.
IMPRESSION: Nonobstructive gas pattern. Mild distal fecal retention.

## 2016-12-15 NOTE — Telephone Encounter (Signed)
done
